# Patient Record
Sex: Female | Born: 1955
Health system: Southern US, Community
[De-identification: ages and names within clinical notes are randomized; demographics above are authoritative.]

## PROBLEM LIST (undated history)

## (undated) DIAGNOSIS — M8580 Other specified disorders of bone density and structure, unspecified site: Secondary | ICD-10-CM

## (undated) DIAGNOSIS — B009 Herpesviral infection, unspecified: Secondary | ICD-10-CM

## (undated) DIAGNOSIS — Z8052 Family history of malignant neoplasm of bladder: Secondary | ICD-10-CM

## (undated) DIAGNOSIS — N951 Menopausal and female climacteric states: Secondary | ICD-10-CM

## (undated) DIAGNOSIS — Z803 Family history of malignant neoplasm of breast: Secondary | ICD-10-CM

## (undated) DIAGNOSIS — E785 Hyperlipidemia, unspecified: Secondary | ICD-10-CM

## (undated) DIAGNOSIS — T7840XA Allergy, unspecified, initial encounter: Secondary | ICD-10-CM

## (undated) DIAGNOSIS — Z8041 Family history of malignant neoplasm of ovary: Secondary | ICD-10-CM

## (undated) HISTORY — DX: Hyperlipidemia, unspecified: E78.5

## (undated) HISTORY — DX: Herpesviral infection, unspecified: B00.9

## (undated) HISTORY — PX: BREAST SURGERY: SHX581

## (undated) HISTORY — PX: REFRACTIVE SURGERY: SHX103

## (undated) HISTORY — PX: KNEE SURGERY: SHX244

## (undated) HISTORY — DX: Family history of malignant neoplasm of bladder: Z80.52

## (undated) HISTORY — DX: Allergy, unspecified, initial encounter: T78.40XA

## (undated) HISTORY — DX: Family history of malignant neoplasm of breast: Z80.3

## (undated) HISTORY — DX: Family history of malignant neoplasm of ovary: Z80.41

## (undated) HISTORY — PX: BREAST BIOPSY: SHX20

## (undated) HISTORY — PX: REDUCTION MAMMAPLASTY: SUR839

---
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
## 2000-04-24 ENCOUNTER — Other Ambulatory Visit: Admission: RE | Admit: 2000-04-24 | Discharge: 2000-04-24 | Payer: Self-pay | Admitting: Obstetrics & Gynecology

## 2000-07-18 ENCOUNTER — Other Ambulatory Visit: Admission: RE | Admit: 2000-07-18 | Discharge: 2000-07-18 | Payer: Self-pay | Admitting: Plastic Surgery

## 2000-07-18 ENCOUNTER — Encounter (INDEPENDENT_AMBULATORY_CARE_PROVIDER_SITE_OTHER): Payer: Self-pay | Admitting: Specialist

## 2001-06-04 ENCOUNTER — Other Ambulatory Visit: Admission: RE | Admit: 2001-06-04 | Discharge: 2001-06-04 | Payer: Self-pay | Admitting: Obstetrics & Gynecology

## 2002-07-06 ENCOUNTER — Other Ambulatory Visit: Admission: RE | Admit: 2002-07-06 | Discharge: 2002-07-06 | Payer: Self-pay | Admitting: Obstetrics & Gynecology

## 2003-07-09 ENCOUNTER — Other Ambulatory Visit: Admission: RE | Admit: 2003-07-09 | Discharge: 2003-07-09 | Payer: Self-pay | Admitting: Obstetrics and Gynecology

## 2004-07-18 ENCOUNTER — Other Ambulatory Visit: Admission: RE | Admit: 2004-07-18 | Discharge: 2004-07-18 | Payer: Self-pay | Admitting: Obstetrics and Gynecology

## 2004-07-21 ENCOUNTER — Encounter: Admission: RE | Admit: 2004-07-21 | Discharge: 2004-07-21 | Payer: Self-pay | Admitting: Obstetrics and Gynecology

## 2005-08-01 ENCOUNTER — Other Ambulatory Visit: Admission: RE | Admit: 2005-08-01 | Discharge: 2005-08-01 | Payer: Self-pay | Admitting: Obstetrics and Gynecology

## 2010-02-23 ENCOUNTER — Ambulatory Visit: Payer: Self-pay | Admitting: Family Medicine

## 2010-05-19 ENCOUNTER — Encounter: Admission: RE | Admit: 2010-05-19 | Discharge: 2010-05-19 | Payer: Self-pay | Admitting: Obstetrics and Gynecology

## 2010-10-31 ENCOUNTER — Ambulatory Visit (HOSPITAL_COMMUNITY): Admission: RE | Admit: 2010-10-31 | Discharge: 2010-10-31 | Payer: Self-pay | Admitting: Obstetrics and Gynecology

## 2010-10-31 HISTORY — PX: DILATION AND CURETTAGE OF UTERUS: SHX78

## 2011-01-30 NOTE — Assessment & Plan Note (Signed)
Summary: Sinus pressure, runny nose, headache - x 1 dy rm 3   Vital Signs:  Patient Profile:   56 Years Old Female CC:      Cold & URI symptoms Height:     60.5 inches Weight:      172 pounds O2 Sat:      100 % O2 treatment:    Room Air Temp:     98.1 degrees F oral Pulse rate:   80 / minute Pulse rhythm:   regular Resp:     16 per minute BP sitting:   136 / 83  (right arm) Cuff size:   regular  Vitals Entered By: Areta Haber CMA (February 23, 2010 5:51 PM)                  Current Allergies: No known allergies History of Present Illness Chief Complaint: Cold & URI symptoms History of Present Illness: ONSET YESTERDAY WITH DRIPPY NOSE, SNEEZING AND EYES WATERING. NO FEVER, NO SORE THROAT. HX OF ALLERGIES. WANT TO MAKE SURE NO SINUS INFECTION. SOME COUGH.   Current Problems: ALLERGIC RHINITIS, SEASONAL (ICD-477.0)   Current Meds ZYRTEC ALLERGY 10 MG TABS (CETIRIZINE HCL) 1 tab by mouth once daily MEDROL (PAK) 4 MG TABS (METHYLPREDNISOLONE) TAKE AS DIRECTED WITH FOOD  REVIEW OF SYSTEMS Constitutional Symptoms      Denies fever, chills, night sweats, weight loss, weight gain, and fatigue.  Eyes       Complains of eye pain and eye drainage.      Denies change in vision, glasses, contact lenses, and eye surgery. Ear/Nose/Throat/Mouth       Complains of frequent runny nose, frequent nose bleeds, sore throat, and hoarseness.      Denies hearing loss/aids, change in hearing, ear pain, ear discharge, dizziness, sinus problems, and tooth pain or bleeding.      Comments: x 1 dy Respiratory       Complains of dry cough.      Denies productive cough, wheezing, shortness of breath, asthma, bronchitis, and emphysema/COPD.  Cardiovascular       Denies murmurs, chest pain, and tires easily with exhertion.    Gastrointestinal       Denies stomach pain, nausea/vomiting, diarrhea, constipation, blood in bowel movements, and indigestion. Genitourniary       Denies painful  urination, kidney stones, and loss of urinary control. Neurological       Complains of headaches.      Denies paralysis, seizures, and fainting/blackouts. Musculoskeletal       Denies muscle pain, joint pain, joint stiffness, decreased range of motion, redness, swelling, muscle weakness, and gout.  Skin       Denies bruising, unusual mles/lumps or sores, and hair/skin or nail changes.  Psych       Denies mood changes, temper/anger issues, anxiety/stress, speech problems, depression, and sleep problems.  Past History:  Past Medical History: Unremarkable  Past Surgical History: Breast Reduction Knee Surgery - arthoscoptic  Lasik  Family History: Family History Hypertension Family History of Stroke M 1st degree relative <50  Social History: Married Never Smoked Alcohol use-no Drug use-no Regular exercise-yes Smoking Status:  never Drug Use:  no Does Patient Exercise:  yes Physical Exam General appearance: well developed, well nourished, no acute distress Eyes: CONJUNCTIVA RED Ears: normal, no lesions or deformities Nasal: THIN CLEAR RHINORRHEA Oral/Pharynx: tongue normal, posterior pharynx without erythema or exudate Neck: neck supple,  trachea midline, no masses Chest/Lungs: no rales, wheezes, or rhonchi bilateral, breath sounds  equal without effort Heart: regular rate and  rhythm, no murmur Assessment New Problems: ALLERGIC RHINITIS, SEASONAL (ICD-477.0)   Plan New Medications/Changes: MEDROL (PAK) 4 MG TABS (METHYLPREDNISOLONE) TAKE AS DIRECTED WITH FOOD  #1 PK x 0, 02/23/2010, Marvis Moeller DO  New Orders: New Patient Level III [99203]   Prescriptions: MEDROL (PAK) 4 MG TABS (METHYLPREDNISOLONE) TAKE AS DIRECTED WITH FOOD  #1 PK x 0   Entered and Authorized by:   Marvis Moeller DO   Signed by:   Marvis Moeller DO on 02/23/2010   Method used:   Print then Give to Patient   RxID:   [id number]   Patient Instructions: 1)  TYLENOL OR MOTRIN AS  NEEDED. CONTINUE ZYRTEC. AVOID MILK PAND CAFFEINE PRODUCTS. FOLLOW UP WITH YOUR PCP OR RETURN IF SYMPTOMS PERSIST OR WORSEN.

## 2011-03-14 LAB — CBC
HCT: 43 % (ref 36.0–46.0)
Hemoglobin: 14.3 g/dL (ref 12.0–15.0)
MCHC: 33.2 g/dL (ref 30.0–36.0)
MCV: 89.9 fL (ref 78.0–100.0)
Platelets: 206 10*3/uL (ref 150–400)
RBC: 4.78 MIL/uL (ref 3.87–5.11)
RDW: 13.9 % (ref 11.5–15.5)
WBC: 9.6 10*3/uL (ref 4.0–10.5)

## 2011-03-14 LAB — URINALYSIS, ROUTINE W REFLEX MICROSCOPIC
Ketones, ur: 15 mg/dL — AB
Nitrite: NEGATIVE
Protein, ur: NEGATIVE mg/dL
Urobilinogen, UA: 0.2 mg/dL (ref 0.0–1.0)
pH: 6 (ref 5.0–8.0)

## 2011-12-12 ENCOUNTER — Other Ambulatory Visit: Payer: Self-pay | Admitting: Dermatology

## 2011-12-29 ENCOUNTER — Emergency Department (INDEPENDENT_AMBULATORY_CARE_PROVIDER_SITE_OTHER)
Admission: EM | Admit: 2011-12-29 | Discharge: 2011-12-29 | Disposition: A | Discharge status: Home / Self Care | Payer: Federal, State, Local not specified - PPO | Source: Home / Self Care | Attending: Family Medicine | Admitting: Family Medicine

## 2011-12-29 DIAGNOSIS — H9209 Otalgia, unspecified ear: Secondary | ICD-10-CM

## 2011-12-29 DIAGNOSIS — H6690 Otitis media, unspecified, unspecified ear: Secondary | ICD-10-CM

## 2011-12-29 DIAGNOSIS — H6693 Otitis media, unspecified, bilateral: Secondary | ICD-10-CM

## 2011-12-29 DIAGNOSIS — J069 Acute upper respiratory infection, unspecified: Secondary | ICD-10-CM

## 2011-12-29 MED ORDER — AMOXICILLIN 875 MG PO TABS: 875.0000 mg | ORAL_TABLET | Freq: Two times a day (BID) | ORAL | Status: AC

## 2011-12-29 MED ORDER — BENZONATATE 200 MG PO CAPS: 200.0000 mg | ORAL_CAPSULE | Freq: Every day | ORAL | Status: AC

## 2011-12-29 NOTE — ED Notes (Signed)
States cough started 12/24/2011, increase coughing at night which prevents her from sleeping; nasal drainage is now greenish. Ear pain started today

## 2011-12-29 NOTE — ED Provider Notes (Signed)
History     CSN: [id number]  Arrival date & time 12/29/11  1448   First MD Initiated Contact with Patient 12/29/11 1650      Chief Complaint  Patient presents with  . Cough  . Otalgia      HPI Comments: Patient complains of approximately 8 day history of gradually progressive URI symptoms beginning with a mild sore throat (now improved), followed by progressive nasal congestion.  A cough started about 4 days ago.  Complains of fatigue and initial myalgias.  Cough is now worse at night and generally non-productive during the day.  There has been no pleuritic pain, shortness of breath, or wheezes.  She complains of tightness in her anterior chest, and ears feel clogged today.  The history is provided by the patient.    History reviewed. No pertinent past medical history.  History reviewed. No pertinent past surgical history.  History reviewed. No pertinent family history.  History  Substance Use Topics  . Smoking status: Never Smoker   . Smokeless tobacco: Not on file  . Alcohol Use: No    OB History    Grav Para Term Preterm Abortions TAB SAB Ect Mult Living                  Review of Systems + sore throat about 8 days ago + cough No pleuritic pain No wheezing + nasal congestion + post-nasal drainage No sinus pain/pressure No itchy/red eyes No earache, but ears feel clogged No hemoptysis No SOB No fever, + chills No nausea No vomiting No abdominal pain No diarrhea No urinary symptoms No skin rashes + fatigue No myalgias No headache Used OTC meds without relief (Nyquil, etc.) Allergies  Review of patient's allergies indicates no known allergies.  Home Medications   Current Outpatient Rx  Name Route Sig Dispense Refill  . AMOXICILLIN 875 MG PO TABS Oral Take 1 tablet (875 mg total) by mouth 2 (two) times daily. 20 tablet 0  . BENZONATATE 200 MG PO CAPS Oral Take 1 capsule (200 mg total) by mouth at bedtime. Take as needed for cough 12 capsule 0     BP 148/80  Pulse 56  Temp 98.5 F (36.9 C)  Resp 24  Ht 5' (1.524 m)  Wt 153 lb 12 oz (69.741 kg)  BMI 30.03 kg/m2  SpO2 99%  Physical Exam Nursing notes and Vital Signs reviewed. Appearance:  Patient appears healthy, stated age, and in no acute distress Eyes:  Pupils are equal, round, and reactive to light and accomodation.  Extraocular movement is intact.  Conjunctivae are not inflamed  Ears:  Canals normal.  Tympanic membranes appear somewhat sclerotic Nose:  Moderately congested turbinates.  No sinus tenderness.   Pharynx:  Normal Neck:  Supple.  Slightly tender shotty posterior nodes are palpated bilaterally  Lungs:  Clear to auscultation.  Breath sounds are equal.  Chest:  Distinct tenderness to palpation over the mid-sternum.  Heart:  Regular rate and rhythm without murmurs, rubs, or gallops.  Abdomen:  Nontender without masses or hepatosplenomegaly.  Bowel sounds are present.  No CVA or flank tenderness.  Extremities:  No edema.  No calf tenderness Skin:  No rash present.   ED Course  Procedures  none  Labs Reviewed -  Tympanogram:  Low peak height (noisy tympanogram) left ear; low peak height right ear    1. Acute upper respiratory infections of unspecified site   2. Otitis media of both ears  MDM  Viral URI with bilateral otitis media.  Begin amoxicillin, Tessalon at bedtime. Take Mucinex D (guaifenesin with decongestant) twice daily for congestion.  Increase fluid intake, rest. May use Afrin nasal spray (or generic oxymetazoline) twice daily for about 5 days.  Also recommend using saline nasal spray several times daily and/or saline nasal irrigation. Stop all antihistamines for now, and other non-prescription cough/cold preparations.        Donna Christen, MD 12/31/11 [phone]

## 2012-04-28 ENCOUNTER — Emergency Department (INDEPENDENT_AMBULATORY_CARE_PROVIDER_SITE_OTHER)
Admission: EM | Admit: 2012-04-28 | Discharge: 2012-04-28 | Disposition: A | Discharge status: Home / Self Care | Payer: Federal, State, Local not specified - PPO | Source: Home / Self Care | Attending: Family Medicine | Admitting: Family Medicine

## 2012-04-28 ENCOUNTER — Encounter: Payer: Self-pay | Admitting: *Deleted

## 2012-04-28 DIAGNOSIS — R109 Unspecified abdominal pain: Secondary | ICD-10-CM

## 2012-04-28 DIAGNOSIS — R110 Nausea: Secondary | ICD-10-CM

## 2012-04-28 DIAGNOSIS — H579 Unspecified disorder of eye and adnexa: Secondary | ICD-10-CM

## 2012-04-28 HISTORY — DX: Menopausal and female climacteric states: N95.1

## 2012-04-28 LAB — POCT URINALYSIS DIP (MANUAL ENTRY)
Bilirubin, UA: NEGATIVE
Glucose, UA: NEGATIVE
Ketones, POC UA: NEGATIVE
Leukocytes, UA: NEGATIVE
Nitrite, UA: NEGATIVE
Protein Ur, POC: NEGATIVE
Spec Grav, UA: 1.025 (ref 1.005–1.03)
Urobilinogen, UA: 0.2 (ref 0–1)
pH, UA: 5 (ref 5–8)

## 2012-04-28 LAB — POCT CBC W AUTO DIFF (K'VILLE URGENT CARE)

## 2012-04-28 MED ORDER — ONDANSETRON HCL 4 MG PO TABS: 4.0000 mg | ORAL_TABLET | Freq: Four times a day (QID) | ORAL | Status: AC

## 2012-04-28 NOTE — ED Provider Notes (Signed)
History     CSN: [id number]  Arrival date & time 04/28/12  1023   First MD Initiated Contact with Patient 04/28/12 1043      Chief Complaint  Patient presents with  . Nausea      HPI Comments: Patient states that she awoke last night with nausea.  It persisted through the night and she later attempted to vomit several times, coughing up a small amount of blood tinged mucous.  She denies diarrhea, and no recent change in bowel movements.  She has had crampy abdominal discomfort.  No GU symptoms.  She denies history of GERD or recurring indigestion.  No fevers, chills, and sweats.  She has been somewhat fatigued.  She is post-menopausal and denies pelvic pain, vaginal discharge or bleeding.  She states that her abdominal pain is somewhat worse with movement.  Patient is a 57 y.o. female presenting with abdominal pain. The history is provided by the patient.  Abdominal Pain The primary symptoms of the illness include abdominal pain, fatigue, nausea, vomiting and hematemesis. The primary symptoms of the illness do not include fever, shortness of breath, diarrhea, hematochezia, dysuria, vaginal discharge or vaginal bleeding. The current episode started 3 to 5 hours ago. The onset of the illness was sudden. The problem has been gradually improving.  The hematemesis is not associated with heartburn.  Associated with: nothing. The patient has not had a change in bowel habit. Additional symptoms associated with the illness include anorexia. Symptoms associated with the illness do not include chills, heartburn, urgency, hematuria, frequency or back pain.    Past Medical History  Diagnosis Date  . Perimenopause     History reviewed. No pertinent past surgical history.  Family History  Problem Relation Age of Onset  . Hypertension Mother   . Stroke Mother   . Hypertension Father   . Anemia Sister   . Diabetes Brother     History  Substance Use Topics  . Smoking status: Never Smoker   .  Smokeless tobacco: Not on file  . Alcohol Use: No    OB History    Grav Para Term Preterm Abortions TAB SAB Ect Mult Living                  Review of Systems  Constitutional: Positive for fatigue. Negative for fever and chills.  Respiratory: Negative for shortness of breath.   Gastrointestinal: Positive for nausea, vomiting, abdominal pain, anorexia and hematemesis. Negative for heartburn, diarrhea and hematochezia.  Genitourinary: Negative for dysuria, urgency, frequency, hematuria, vaginal bleeding and vaginal discharge.  Musculoskeletal: Negative for back pain.  Skin: Negative.     Allergies  Review of patient's allergies indicates no known allergies.  Home Medications   Current Outpatient Rx  Name Route Sig Dispense Refill  . ONDANSETRON HCL 4 MG PO TABS Oral Take 1 tablet (4 mg total) by mouth every 6 (six) hours. as needed for nausea 12 tablet 0    BP 128/77  Pulse 71  Temp(Src) 98.3 F (36.8 C) (Oral)  Resp 14  Ht 5' 0.5" (1.537 m)  Wt 153 lb (69.4 kg)  BMI 29.39 kg/m2  SpO2 98%  Physical Exam Nursing notes and Vital Signs reviewed. Appearance:  Patient appears healthy, stated age, and in no acute distress Eyes:  Pupils are equal, round, and reactive to light and accomodation.  Extraocular movement is intact.  Conjunctivae are not inflamed   Nose:   No sinus tenderness.  Pharynx:  Normal;  moist mucous  membranes  Neck:  Supple.  No adenopathy Lungs:  Clear to auscultation.  Breath sounds are equal.  Heart:  Regular rate and rhythm without murmurs, rubs, or gallops.  Abdomen:   Mild diffuse non-localized tenderness without masses or hepatosplenomegaly.  Bowel sounds are present.  No CVA or flank tenderness.  No rebound tenderness. Extremities:  No edema.  No calf tenderness Skin:  No rash present.   ED Course  Procedures none   Labs Reviewed  POCT CBC W AUTO DIFF ([facility]) CBC:  WBC 11.0; LY 8.0; MO 4.4; GR 87.6; Hgb 13.1; Platelets 205     POCT URINALYSIS DIP (MANUAL ENTRY) negative      1. Nausea   2. Abdominal pain; suspect early viral gastroenteritis.  Abdominal tenderness is minimal with good bowel sounds, and white count minimally elevated.      MDM  Rx given for Zofran. Begin clear liquids today, then advance to a [employer] tomorrow.  Resume a regular diet when tolerated She states that she has an appt with her PCP on 05/01/12.  Recommend that she keep this appt, but followup earlier if symptoms worsen.  She will also need follow-up if she develops persistent hematemesis       Lattie Haw, MD 04/28/12 1309

## 2012-04-28 NOTE — ED Notes (Signed)
Patient c/o nausea, dry heaves and abdominal pain x last night. Pain is cramping in center of abdomen.

## 2012-04-28 NOTE — Discharge Instructions (Signed)
Begin clear liquids today, then advance to a [employer] tomorrow.  Resume a regular diet when tolerated  Clear Liquid Diet The clear liquid dietconsists of foods that are liquid or will become liquid at room temperature.You should be able to see through the liquid and beverages. Examples of foods allowed on a clear liquid diet include fruit juice, broth or bouillon, gelatin, or frozen ice pops. The purpose of this diet is to provide necessary fluid, electrolytes such as sodium and potassium, and energy to keep the body functioning during times when you are not able to consume a regular diet.A clear liquid diet should not be continued for long periods of time as it is not nutritionally adequate.  REASONS FOR USING A CLEAR LIQUID DIET  In sudden onset (acute) conditions for a patient before or after surgery.   As the first step in oral feeding.   For fluid and electrolyte replacement in diarrheal diseases.   As a diet before certain medical tests are performed.  ADEQUACY The clear liquid diet is adequate only in ascorbic acid, according to the Recommended Dietary Allowances of the [employer]. CHOOSING FOODS Breads and Starches  Allowed:  None are allowed.   Avoid: All are avoided.  Vegetables  Allowed:  Strained tomato or vegetable juice.   Avoid: Any others.  Fruit  Allowed:  Strained fruit juices and fruit drinks. Include 1 serving of citrus or vitamin C-enriched fruit juice daily.   Avoid: Any others.  Meat and Meat Substitutes  Allowed:  None are allowed.   Avoid: All are avoided.  Milk  Allowed:  None are allowed.   Avoid: All are avoided.  Soups and Combination Foods  Allowed:  Clear bouillon, broth, or strained broth-based soups.   Avoid: Any others.  Desserts and Sweets  Allowed:  Sugar, honey. High protein gelatin. Flavored gelatin, ices, or frozen ice pops that do not contain milk.   Avoid: Any others.  Fats and Oils  Allowed:  None are  allowed.   Avoid: All are avoided.  Beverages  Allowed: Cereal beverages, coffee (regular or decaffeinated), tea, or soda at the discretion of your caregiver.   Avoid: Any others.  Condiments  Allowed:  Iodized salt.   Avoid: Any others, including pepper.  Supplements  Allowed:  Liquid nutrition beverages.   Avoid: Any others that contain lactose or fiber.  SAMPLE MEAL PLAN Breakfast  4 oz (120 mL) strained orange juice.    to 1 cup (125 to 250 mL) gelatin (plain or fortified).   1 cup (250 mL) beverage (coffee or tea).   Sugar, if desired.  Midmorning Snack   cup (125 mL) gelatin (plain or fortified).  Lunch  1 cup (250 mL) broth or consomm.   4 oz (120 mL) strained grapefruit juice.    cup (125 mL) gelatin (plain or fortified).   1 cup (250 mL) beverage (coffee or tea).   Sugar, if desired.  Midafternoon Snack   cup (125 mL) fruit ice.    cup (125 mL) strained fruit juice.  Dinner  1 cup (250 mL) broth or consomm.    cup (125 mL) cranberry juice.    cup (125 mL) flavored gelatin (plain or fortified).   1 cup (250 mL) beverage (coffee or tea).   Sugar, if desired.  Evening Snack  4 oz (120 mL) strained apple juice (vitamin C-fortified).    cup (125 mL) flavored gelatin (plain or fortified).  Document Released: 12/17/2005 Document Revised: 12/06/2011 Document  Reviewed: 03/16/2011 [facility] Patient Information 2012 [place], [place].   B.R.A.T. Diet Your doctor has recommended the B.R.A.T. diet for you or your child until the condition improves. This is often used to help control diarrhea and vomiting symptoms. If you or your child can tolerate clear liquids, you may have:  Bananas.   Rice.   Applesauce.   Toast (and other simple starches such as crackers, potatoes, noodles).  Be sure to avoid dairy products, meats, and fatty foods until symptoms are better. Fruit juices such as apple, grape, and prune juice can make diarrhea worse.  Avoid these. Continue this diet for 2 days or as instructed by your caregiver. Document Released: 12/17/2005 Document Revised: 12/06/2011 Document Reviewed: 06/05/2007 [facility] Patient Information 2012 [place], [place].

## 2013-01-16 ENCOUNTER — Other Ambulatory Visit: Payer: Self-pay

## 2013-06-23 LAB — HEPATIC FUNCTION PANEL
ALT: 15 U/L (ref 7–35)
AST: 16 U/L (ref 13–35)
Alkaline Phosphatase: 77 U/L (ref 25–125)

## 2013-06-23 LAB — BASIC METABOLIC PANEL
BUN: 12 mg/dL (ref 4–21)
Creatinine: 0.6 mg/dL (ref 0.5–1.1)
Glucose: 93 mg/dL
Potassium: 4.2 mmol/L (ref 3.4–5.3)
SODIUM: 146 mmol/L (ref 137–147)

## 2013-06-23 LAB — LIPID PANEL
Cholesterol: 229 mg/dL — AB (ref 0–200)
HDL: 61 mg/dL (ref 35–70)
LDL Cholesterol: 157 mg/dL
Triglycerides: 57 mg/dL (ref 40–160)

## 2013-06-23 LAB — TSH: TSH: 1.56 u[IU]/mL (ref 0.41–5.90)

## 2013-06-23 LAB — CBC AND DIFFERENTIAL
HEMOGLOBIN: 13.1 g/dL (ref 12.0–16.0)
Platelets: 217 10*3/uL (ref 150–399)
WBC: 5.8 10^3/mL

## 2014-02-03 ENCOUNTER — Ambulatory Visit (INDEPENDENT_AMBULATORY_CARE_PROVIDER_SITE_OTHER): Payer: Federal, State, Local not specified - PPO | Admitting: Obstetrics and Gynecology

## 2014-02-03 ENCOUNTER — Encounter: Payer: Self-pay | Admitting: Obstetrics and Gynecology

## 2014-02-03 VITALS — BP 140/82 | HR 52 | Ht 60.25 in | Wt 184.0 lb

## 2014-02-03 DIAGNOSIS — Z0000 Encounter for general adult medical examination without abnormal findings: Secondary | ICD-10-CM

## 2014-02-03 DIAGNOSIS — Z01419 Encounter for gynecological examination (general) (routine) without abnormal findings: Secondary | ICD-10-CM

## 2014-02-03 LAB — POCT URINALYSIS DIPSTICK
BILIRUBIN UA: NEGATIVE
GLUCOSE UA: NEGATIVE
KETONES UA: NEGATIVE
LEUKOCYTES UA: NEGATIVE
Nitrite, UA: NEGATIVE
PH UA: 5
PROTEIN UA: NEGATIVE
UROBILINOGEN UA: NEGATIVE

## 2014-02-03 LAB — COMPREHENSIVE METABOLIC PANEL
ALBUMIN: 4.6 g/dL (ref 3.5–5.2)
ALT: 22 U/L (ref 0–35)
AST: 19 U/L (ref 0–37)
Alkaline Phosphatase: 67 U/L (ref 39–117)
BUN: 11 mg/dL (ref 6–23)
CALCIUM: 9.5 mg/dL (ref 8.4–10.5)
CO2: 28 meq/L (ref 19–32)
CREATININE: 0.69 mg/dL (ref 0.50–1.10)
Chloride: 106 mEq/L (ref 96–112)
GLUCOSE: 86 mg/dL (ref 70–99)
Potassium: 4 mEq/L (ref 3.5–5.3)
Sodium: 145 mEq/L (ref 135–145)
Total Bilirubin: 0.4 mg/dL (ref 0.2–1.2)
Total Protein: 7.2 g/dL (ref 6.0–8.3)

## 2014-02-03 LAB — CBC
HCT: 44.1 % (ref 36.0–46.0)
HEMOGLOBIN: 14.5 g/dL (ref 12.0–15.0)
MCH: 28.4 pg (ref 26.0–34.0)
MCHC: 32.9 g/dL (ref 30.0–36.0)
MCV: 86.3 fL (ref 78.0–100.0)
PLATELETS: 220 10*3/uL (ref 150–400)
RBC: 5.11 MIL/uL (ref 3.87–5.11)
RDW: 14.1 % (ref 11.5–15.5)
WBC: 7.2 10*3/uL (ref 4.0–10.5)

## 2014-02-03 LAB — LIPID PANEL
CHOL/HDL RATIO: 4.3 ratio
Cholesterol: 234 mg/dL — ABNORMAL HIGH (ref 0–200)
HDL: 55 mg/dL (ref >39)
LDL Cholesterol: 160 mg/dL — ABNORMAL HIGH (ref 0–99)
TRIGLYCERIDES: 94 mg/dL (ref <150)
VLDL: 19 mg/dL (ref 0–40)

## 2014-02-03 NOTE — Progress Notes (Signed)
Patient ID: Rebekah [name], female   DOB: [birth year], 59 y.o.   MRN: [id number]   58 y.o.   Married    Caucasian   female   G3P2011   here for annual exam.  LMP 2011. Some hot flashes if stressed.    Mother diagnosed with TTP and ovarian cancer. Father diagnosed with glaucoma and bladder cancer. Maternal cousin with ovarian cancer. Maternal cousin with breast cancer (not the same person) Maternal grandmother with ovarian cancer or uterine cancer? Sister living in group home.     Patient starting a new diet tomorrow.  Still with urinary leakage. Leaks with exercise. Vesicare gives extra time to get to the bathroom.  Up once a night and not three times a night. She does not think it really helps.  Drinks 2 coffees per day.  Had done physical therapy, which helped.     No LMP recorded. Patient is not currently having periods (Reason: Perimenopausal).          Sexually active: no  The current method of family planning is none.    Exercising: CrossFit and Yoga Last mammogram:  01/20/13, Bi-Rads 1: negative Last pap smear:  01/16/13, WNL, neg HR HPV History of abnormal pap: yes Smoking:  never Alcohol: 3-4 drinks per week Last colonoscopy:  2008 Last Bone Density:  Not sure Last tetanus shot:  UTD Last cholesterol check:  ?  Hgb:                Urine:   Family History  Problem Relation Age of Onset  . Hypertension Mother   . Stroke Mother   . Hypertension Father   . Anemia Sister   . Diabetes Brother     Patient Active Problem List   Diagnosis Date Noted  . ALLERGIC RHINITIS, SEASONAL 02/23/2010    Past Medical History  Diagnosis Date  . Perimenopause     History reviewed. No pertinent past surgical history.  Allergies: Review of patient's allergies indicates no known allergies.  Current Outpatient Prescriptions  Medication Sig Dispense Refill  . Brimonidine Tartrate (MIRVASO) 0.33 % GEL Apply 1 application topically every morning.      . Cholecalciferol  (VITAMIN D3) 2000 UNITS capsule Take 2,000 Units by mouth daily.      . hydrocortisone (ANUSOL-HC) 2.5 % rectal cream Place 1 application rectally 2 (two) times daily.      . metroNIDAZOLE (METROGEL) 1 % gel Apply 1 application topically at bedtime.      Marland Kitchen nystatin-triamcinolone (MYCOLOG II) cream Apply 1 application topically 2 (two) times daily.      . Olopatadine HCl (PATADAY) 0.2 % SOLN Place 1 drop into both eyes 2 (two) times daily.      . solifenacin (VESICARE) 5 MG tablet Take 5 mg by mouth daily.      Marland Kitchen triamcinolone cream (KENALOG) 0.1 % Apply 1 application topically 2 (two) times daily.      . vitamin B-12 (CYANOCOBALAMIN) 1000 MCG tablet Take 1,000 mcg by mouth daily.      Marland Kitchen zolpidem (AMBIEN) 10 MG tablet Take 5 mg by mouth at bedtime as needed for sleep.       No current facility-administered medications for this visit.    ROS: Pertinent items are noted in HPI.  Social Hx:  3 grandchildren  Exam:    BP 140/82  Pulse 52  Ht 5' 0.25" (1.53 m)  Wt 184 lb (83.462 kg)  BMI 35.65 kg/m2   Wt Readings from  Last 3 Encounters:  02/03/14 184 lb (83.462 kg)  04/28/12 153 lb (69.4 kg)  12/29/11 153 lb 12 oz (69.741 kg)     Ht Readings from Last 3 Encounters:  02/03/14 5' 0.25" (1.53 m)  04/28/12 5' 0.5" (1.537 m)  12/29/11 5' (1.524 m)    General appearance: alert, cooperative and appears stated age Head: Normocephalic, without obvious abnormality, atraumatic Neck: no adenopathy, supple, symmetrical, trachea midline and thyroid not enlarged, symmetric, no tenderness/mass/nodules Lungs: clear to auscultation bilaterally Breasts: Scars consistent with bilateral breast reduction, Inspection negative, No nipple retraction or dimpling, No nipple discharge or bleeding, No axillary or supraclavicular adenopathy, Normal to palpation without dominant masses Heart: regular rate and rhythm Abdomen: obese,  soft, non-tender; bowel sounds normal; no masses,  no organomegaly Extremities:  extremities normal, atraumatic, no cyanosis or edema Skin: Skin color, texture, turgor normal. No rashes or lesions Lymph nodes: Cervical, supraclavicular, and axillary nodes normal. No abnormal inguinal nodes palpated Neurologic: Grossly normal   Pelvic: External genitalia:  no lesions              Urethra:  normal appearing urethra with no masses, tenderness or lesions              Bartholins and Skenes: normal                 Vagina: normal appearing vagina with normal color and discharge, no lesions              Cervix: normal appearance              Pap taken: no        Bimanual Exam:  Uterus:  uterus is normal size, shape, consistency and nontender                                      Adnexa: normal adnexa in size, nontender and no masses                                      Rectovaginal: Confirms                                      Anus:  normal sphincter tone, no lesions  A: normal menopausal exam Genuine stress incontinence Family history of breast and ovarian cancer.   P:     Mammogram at [facility].  Order placed and patient will call. pap smear in 2019. OK to stop Vesicare. Do Kegels. Check lipid profile, CBC, CMP.  Encouraged weight loss.  I discussed genetic testing if patient desires. return annually or prn     An After Visit Summary was printed and given to the patient.

## 2014-02-03 NOTE — Patient Instructions (Signed)

## 2014-02-05 ENCOUNTER — Telehealth: Payer: Self-pay | Admitting: Obstetrics and Gynecology

## 2014-02-05 ENCOUNTER — Telehealth: Payer: Self-pay | Admitting: *Deleted

## 2014-02-05 NOTE — Telephone Encounter (Signed)
See next phone message from [facility].  Routing to provider for final review. Patient agreeable to disposition. Will close encounter

## 2014-02-05 NOTE — Telephone Encounter (Signed)
Patient said she was returning carols call i do not see a message

## 2014-02-05 NOTE — Telephone Encounter (Signed)
Thank you.  I will close encounter.  

## 2014-03-29 ENCOUNTER — Other Ambulatory Visit: Payer: Self-pay | Admitting: Obstetrics and Gynecology

## 2014-03-29 ENCOUNTER — Telehealth: Payer: Self-pay | Admitting: Obstetrics and Gynecology

## 2014-03-29 DIAGNOSIS — Z9889 Other specified postprocedural states: Secondary | ICD-10-CM

## 2014-03-29 DIAGNOSIS — Z1231 Encounter for screening mammogram for malignant neoplasm of breast: Secondary | ICD-10-CM

## 2014-03-29 NOTE — Telephone Encounter (Signed)
Returning a call to Kaitlyn. °

## 2014-03-29 NOTE — Telephone Encounter (Signed)
I mentioned [facility]. They are located off of [address].

## 2014-03-29 NOTE — Telephone Encounter (Signed)
The [facility] doctor is Dr. Beatrice Lecher.

## 2014-03-29 NOTE — Telephone Encounter (Signed)
Patient says at her last visit Dr.Silva recommeded a PCP to her. Patient cannot remember this PCP's name.

## 2014-03-29 NOTE — Telephone Encounter (Signed)
Dr.Silva, I do not see referral information for this patient in last office visit or referral tab in [facility]. Who would you like me to recommend for this patient?

## 2014-03-29 NOTE — Telephone Encounter (Signed)
Spoke with patient. Advised the doctor recommended for the [place] is Dr.Catherine Metheney. Patient verbalizes understanding and is agreeable.  Routing to provider for final review. Patient agreeable to disposition. Will close encounter

## 2014-03-29 NOTE — Telephone Encounter (Signed)
Spoke with patient. [place] recommended. Address and phone number given. Patient states that she remembers Dr.Silva mentioning either [place] or [facility] [place] to see a female provider. Patient requesting possible information about these if any from Dr.Silva due to the fact that they are closer to home. States that if neither are correct she will be willing to try [facility]. Advised would check with provider and call back.

## 2014-03-29 NOTE — Telephone Encounter (Signed)
Left message to call Kaitlyn at [phone]. 

## 2014-04-09 ENCOUNTER — Ambulatory Visit
Admission: RE | Admit: 2014-04-09 | Discharge: 2014-04-09 | Disposition: A | Discharge status: Home / Self Care | Payer: Federal, State, Local not specified - PPO | Source: Ambulatory Visit | Attending: Obstetrics and Gynecology | Admitting: Obstetrics and Gynecology

## 2014-04-09 DIAGNOSIS — Z9889 Other specified postprocedural states: Secondary | ICD-10-CM

## 2014-04-09 DIAGNOSIS — Z1231 Encounter for screening mammogram for malignant neoplasm of breast: Secondary | ICD-10-CM

## 2014-04-14 ENCOUNTER — Ambulatory Visit (INDEPENDENT_AMBULATORY_CARE_PROVIDER_SITE_OTHER): Payer: Federal, State, Local not specified - PPO | Admitting: Physician Assistant

## 2014-04-14 ENCOUNTER — Encounter: Payer: Self-pay | Admitting: Physician Assistant

## 2014-04-14 VITALS — BP 112/62 | HR 54 | Ht 60.0 in | Wt 177.0 lb

## 2014-04-14 DIAGNOSIS — J309 Allergic rhinitis, unspecified: Secondary | ICD-10-CM

## 2014-04-14 DIAGNOSIS — N318 Other neuromuscular dysfunction of bladder: Secondary | ICD-10-CM

## 2014-04-14 DIAGNOSIS — E7800 Pure hypercholesterolemia, unspecified: Secondary | ICD-10-CM

## 2014-04-14 DIAGNOSIS — R002 Palpitations: Secondary | ICD-10-CM

## 2014-04-14 DIAGNOSIS — N3281 Overactive bladder: Secondary | ICD-10-CM | POA: Insufficient documentation

## 2014-04-14 DIAGNOSIS — J302 Other seasonal allergic rhinitis: Secondary | ICD-10-CM | POA: Insufficient documentation

## 2014-04-14 MED ORDER — MIRABEGRON ER 25 MG PO TB24: 25.0000 mg | ORAL_TABLET | Freq: Every day | ORAL | Status: DC

## 2014-04-14 MED ORDER — LEVOCETIRIZINE DIHYDROCHLORIDE 5 MG PO TABS: 5.0000 mg | ORAL_TABLET | Freq: Every evening | ORAL | Status: DC

## 2014-04-14 MED ORDER — BECLOMETHASONE DIPROPIONATE 80 MCG/ACT NA AERS: INHALATION_SPRAY | NASAL | Status: DC

## 2014-04-14 NOTE — Patient Instructions (Addendum)
Allergies start xyzal. Stop OTC anti-histamines. Try Qnasl 2 sprays each nostril daily. Stop flonase.  Myrbetriq start once daily.

## 2014-04-16 NOTE — Progress Notes (Addendum)
Subjective:    Patient ID: Rebekah [name], female    DOB: 1955-08-16, 59 y.o.   MRN: [id number]  HPI Pt is a 59 yo female who presents to the clinic to establish care.   . Active Ambulatory Problems    Diagnosis Date Noted  . ALLERGIC RHINITIS, SEASONAL 02/23/2010  . Elevated LDL cholesterol level 04/14/2014  . Allergic rhinitis 04/14/2014  . Seasonal allergies 04/14/2014  . OAB (overactive bladder) 04/14/2014   Resolved Ambulatory Problems    Diagnosis Date Noted  . No Resolved Ambulatory Problems   Past Medical History  Diagnosis Date  . Perimenopause   . Hyperlipidemia    . History   Social History  . Marital Status: Married    Spouse Name: N/A    Number of Children: 2  . Years of Education: N/A   Occupational History  . Not on file.   Social History Main Topics  . Smoking status: Never Smoker   . Smokeless tobacco: Never Used  . Alcohol Use: 1.5 - 2.0 oz/week    3-4 drink(s) per week  . Drug Use: No  . Sexual Activity: No   Other Topics Concern  . Not on file   Social History Narrative  . No narrative on file   . Family History  Problem Relation Age of Onset  . Hypertension Mother   . Stroke Mother   . Cancer Mother   . Hyperlipidemia Mother   . Hypertension Father   . Cancer Father   . Hyperlipidemia Father   . Anemia Sister   . Diabetes Brother    Pt has a hx of really bad allergies. Per pt seem to be getting worse every year. She switches up anti histamine to get better coverage. She is currently on clartin and flonase.and pataday. She feels congested and has a continual dry cough. Denies any fever, chills, nausea, vomiting, ST, ear pain.   She also has problems with stress and urge incontience. She has tried numerous exercises and natural supplements which have not worked. She also started Vesicare but caused her mouth to be extremely dry in the mouth. She had to stop. She is now on nothing she leaks every day multiple times a day.   For  last 1-2 months felt heart fluttering. Hx of irregular heart beat. No known trigger. No relation to exertion. No CP, SOB, nausea or vomiting. Resolves quickly.    Review of Systems  All other systems reviewed and are negative.      Objective:   Physical Exam  Constitutional: She is oriented to person, place, and time. She appears well-developed and well-nourished.  HENT:  Head: Normocephalic and atraumatic.  Right Ear: External ear normal.  Left Ear: External ear normal.  Mouth/Throat: Oropharynx is clear and moist.  TM's clear bilaterally.  Oropharynx erythematous with PND.  Bilateral red and swollen nasal turbinates.  Negative for maxillary tenderness to palpitation.   Eyes:  Injected eyes bilaterally.   Neck: Normal range of motion. Neck supple. No thyromegaly present.  Cardiovascular: Normal rate, regular rhythm and normal heart sounds.   Pulmonary/Chest: Effort normal and breath sounds normal. She has no wheezes.  Lymphadenopathy:    She has no cervical adenopathy.  Neurological: She is alert and oriented to person, place, and time.  Skin: Skin is dry.  Psychiatric: She has a normal mood and affect. Her behavior is normal.          Assessment & Plan:  Allergic rhinitis/seasonal allergies- Allergies  start xyzal. Stop OTC anti-histamines. Try Qnasl 2 sprays each nostril daily. Stop flonase. Continue pataday for eye allergies. Could consider adding singulair if needed.   Cough- likely due to PND. Peak flows in the green. Goal is to treat allergies and treat cough in process.   Hx of irregular heart rate/heart fluttering- EKG sinus bradycardia, No ST elevation or depression, negative for PVC's or arrhthymias. likey PVC's could be food, drink, stress. Look for trigger and try to avoid. Reassured PVC's are normal variant.   OAB- start myrbetriq daily 25mg  daily. Discussed SE. Follow up in one month.   Elevated LDL- will continue to monitor. Continue to have low fat diet  and consider fish oil.

## 2014-04-21 ENCOUNTER — Encounter: Payer: Self-pay | Admitting: *Deleted

## 2014-04-28 ENCOUNTER — Encounter: Payer: Self-pay | Admitting: *Deleted

## 2014-05-10 ENCOUNTER — Other Ambulatory Visit: Payer: Self-pay | Admitting: Physician Assistant

## 2014-05-10 ENCOUNTER — Encounter: Payer: Self-pay | Admitting: Physician Assistant

## 2014-05-10 MED ORDER — MONTELUKAST SODIUM 10 MG PO TABS: 10.0000 mg | ORAL_TABLET | Freq: Every day | ORAL | Status: DC

## 2014-05-26 ENCOUNTER — Ambulatory Visit: Payer: Federal, State, Local not specified - PPO | Admitting: Physician Assistant

## 2014-05-28 ENCOUNTER — Ambulatory Visit (INDEPENDENT_AMBULATORY_CARE_PROVIDER_SITE_OTHER): Payer: Federal, State, Local not specified - PPO | Admitting: Physician Assistant

## 2014-05-28 ENCOUNTER — Encounter: Payer: Self-pay | Admitting: Physician Assistant

## 2014-05-28 VITALS — BP 139/74 | HR 55 | Ht 60.0 in | Wt 179.0 lb

## 2014-05-28 DIAGNOSIS — J309 Allergic rhinitis, unspecified: Secondary | ICD-10-CM

## 2014-05-28 DIAGNOSIS — N3281 Overactive bladder: Secondary | ICD-10-CM

## 2014-05-28 DIAGNOSIS — N318 Other neuromuscular dysfunction of bladder: Secondary | ICD-10-CM

## 2014-05-28 DIAGNOSIS — J302 Other seasonal allergic rhinitis: Secondary | ICD-10-CM

## 2014-05-28 MED ORDER — MIRABEGRON ER 25 MG PO TB24: 25.0000 mg | ORAL_TABLET | Freq: Every day | ORAL | Status: DC

## 2014-05-28 MED ORDER — BECLOMETHASONE DIPROPIONATE 80 MCG/ACT NA AERS: INHALATION_SPRAY | NASAL | Status: DC

## 2014-05-28 NOTE — Progress Notes (Signed)
   Subjective:    Patient ID: Rebekah [name], female    DOB: August 07, 1955, 59 y.o.   MRN: [id number]  HPI Pt presents to clinic to follow up on allergies. Pt reports she is significantly better since addition of singulair  To her xyzal, pataday, qnasl regimen. She still feels like these are the worst her allergies have been. She would like to see if she is a candidate for allergy shots.   OAB- myrbetriq helps signifcantly with symptoms of OAB very happy with symptoms control but still pricey for pt.    Review of Systems  All other systems reviewed and are negative.      Objective:   Physical Exam  Constitutional: She is oriented to person, place, and time. She appears well-developed and well-nourished.  HENT:  Head: Normocephalic and atraumatic.  Right Ear: External ear normal.  Left Ear: External ear normal.  Nose: Nose normal.  Mouth/Throat: Oropharynx is clear and moist.  TM's clear.   Negative for any maxillary or frontal sinus tenderness.   Eyes:  Bilateral conjunctivia with some injection.   Neck: Normal range of motion. Neck supple.  Cardiovascular: Normal rate, regular rhythm and normal heart sounds.   Pulmonary/Chest: Effort normal and breath sounds normal. She has no wheezes.  Lymphadenopathy:    She has no cervical adenopathy.  Neurological: She is alert and oriented to person, place, and time.  Skin: Skin is dry.  Psychiatric: She has a normal mood and affect. Her behavior is normal.          Assessment & Plan:  Chronic allergies/ Seasonal allergies/allergic rhinitis/allergic conjunctivitis- continue on xyzal, singulair, qnasl, pataday. Will refer to allergist for further testing. Seems pt is still not 100 percent controlled and would benefit from appt.   OAB- refilled myrbetriq. Will contact drug rep to see why coupon card is not working for her.

## 2014-05-30 DIAGNOSIS — J309 Allergic rhinitis, unspecified: Secondary | ICD-10-CM | POA: Insufficient documentation

## 2014-06-04 ENCOUNTER — Other Ambulatory Visit: Payer: Self-pay | Admitting: *Deleted

## 2014-06-04 MED ORDER — MIRABEGRON ER 25 MG PO TB24: 25.0000 mg | ORAL_TABLET | Freq: Every day | ORAL | Status: DC

## 2014-06-06 ENCOUNTER — Encounter: Payer: Self-pay | Admitting: Physician Assistant

## 2014-07-08 ENCOUNTER — Emergency Department (INDEPENDENT_AMBULATORY_CARE_PROVIDER_SITE_OTHER): Payer: Federal, State, Local not specified - PPO

## 2014-07-08 ENCOUNTER — Telehealth: Payer: Self-pay

## 2014-07-08 ENCOUNTER — Emergency Department (INDEPENDENT_AMBULATORY_CARE_PROVIDER_SITE_OTHER)
Admission: EM | Admit: 2014-07-08 | Discharge: 2014-07-08 | Disposition: A | Discharge status: Home / Self Care | Payer: Federal, State, Local not specified - PPO | Source: Home / Self Care | Attending: Emergency Medicine | Admitting: Emergency Medicine

## 2014-07-08 ENCOUNTER — Encounter: Payer: Self-pay | Admitting: Emergency Medicine

## 2014-07-08 DIAGNOSIS — M79609 Pain in unspecified limb: Secondary | ICD-10-CM

## 2014-07-08 DIAGNOSIS — S9031XA Contusion of right foot, initial encounter: Secondary | ICD-10-CM

## 2014-07-08 DIAGNOSIS — S9030XA Contusion of unspecified foot, initial encounter: Secondary | ICD-10-CM

## 2014-07-08 MED ORDER — IBUPROFEN 200 MG PO TABS: ORAL_TABLET | ORAL | Status: DC

## 2014-07-08 NOTE — Telephone Encounter (Signed)
Rebekah [name] called and stated that she fell yesterday on her steps and today she has a knot the size of a goose egg on her foot and it is starting to hurt and bruise and turn really red. I told her to come over to urgent care and let them check it out just to see iof there could be a little crack or hairline fracture. Pt agreed that's what she thinks she should do./Montrice Montuori,CMA

## 2014-07-08 NOTE — ED Provider Notes (Signed)
CSN: [id number]     Arrival date & time 07/08/14  1742 History   First MD Initiated Contact with Patient 07/08/14 1754     Chief Complaint  Patient presents with  . Foot Pain   (Consider location/radiation/quality/duration/timing/severity/associated sxs/prior Treatment) HPI Reports falling down a few steps and twisting right foot last evening; has not had great deal of pain and is able to ambulate, but lateral area now edematous and bruised. The pain is sharp, 3/10 in intensity. Worse when trying to weight-bear, but she can ambulate Past Medical History  Diagnosis Date  . Perimenopause   . Hyperlipidemia    History reviewed. No pertinent past surgical history. Family History  Problem Relation Age of Onset  . Hypertension Mother   . Stroke Mother   . Cancer Mother   . Hyperlipidemia Mother   . Hypertension Father   . Cancer Father   . Hyperlipidemia Father   . Anemia Sister   . Diabetes Brother    History  Substance Use Topics  . Smoking status: Never Smoker   . Smokeless tobacco: Never Used  . Alcohol Use: 1.5 - 2.0 oz/week    3-4 drink(s) per week   OB History   Grav Para Term Preterm Abortions TAB SAB Ect Mult Living   3 2 2  1     1      Review of Systems  All other systems reviewed and are negative.   Allergies  Vesicare  Home Medications   Prior to Admission medications   Medication Sig Start Date End Date Taking? Authorizing Provider  Beclomethasone Dipropionate 80 MCG/ACT AERS 2 sprays each nostril daily. 05/28/14   Jade L Breeback, PA-C  Brimonidine Tartrate (MIRVASO) 0.33 % GEL Apply 1 application topically every morning.    Historical Provider, MD  Cholecalciferol (VITAMIN D3) 2000 UNITS capsule Take 2,000 Units by mouth daily.    Historical Provider, MD  hydrocortisone (ANUSOL-HC) 2.5 % rectal cream Place 1 application rectally 2 (two) times daily.    Historical Provider, MD  ibuprofen (ADVIL,MOTRIN) 200 MG tablet Take three tablets ( 600 milligrams  total) every 6 with food as needed for pain. 07/08/14   Jacqulyn Cane, MD  levocetirizine (XYZAL) 5 MG tablet Take 1 tablet (5 mg total) by mouth every evening. 04/14/14   Jade L Breeback, PA-C  mirabegron ER (MYRBETRIQ) 25 MG TB24 tablet Take 1 tablet (25 mg total) by mouth daily. 06/04/14   Jade L Breeback, PA-C  montelukast (SINGULAIR) 10 MG tablet Take 1 tablet (10 mg total) by mouth at bedtime. 05/10/14   Jade L Breeback, PA-C  nystatin-triamcinolone (MYCOLOG II) cream Apply 1 application topically 2 (two) times daily.    Historical Provider, MD  Olopatadine HCl (PATADAY) 0.2 % SOLN Place 1 drop into both eyes 2 (two) times daily.    Historical Provider, MD  triamcinolone cream (KENALOG) 0.1 % Apply 1 application topically 2 (two) times daily.    Historical Provider, MD  vitamin B-12 (CYANOCOBALAMIN) 1000 MCG tablet Take 1,000 mcg by mouth daily.    Historical Provider, MD  zolpidem (AMBIEN) 10 MG tablet Take 5 mg by mouth at bedtime as needed for sleep.    Historical Provider, MD   BP 131/76  Pulse 56  Temp(Src) 98.2 F (36.8 C) (Oral)  Resp 16  Ht 5' (1.524 m)  Wt 180 lb (81.647 kg)  BMI 35.15 kg/m2  SpO2 95% Physical Exam  Nursing note and vitals reviewed. Constitutional: She is oriented to person, place,  and time. She appears well-developed and well-nourished. No distress.  HENT:  Head: Normocephalic and atraumatic.  Eyes: Conjunctivae and EOM are normal. Pupils are equal, round, and reactive to light. No scleral icterus.  Neck: Normal range of motion.  Cardiovascular: Normal rate.   Pulmonary/Chest: Effort normal.  Abdominal: She exhibits no distension.  Musculoskeletal: Normal range of motion.  Right lateral foot is swollen ecchymotic and tender. Neurovascular intact  Neurological: She is alert and oriented to person, place, and time.  Skin: Skin is warm.  Psychiatric: She has a normal mood and affect.   Right ankle exam normal. ED Course  Procedures (including critical care  time) Labs Review Labs Reviewed - No data to display  Imaging Review Dg Foot Complete Right  07/08/2014   CLINICAL DATA:  Lateral foot pain after falling yesterday.  EXAM: RIGHT FOOT COMPLETE - 3+ VIEW  COMPARISON:  None.  FINDINGS: The bones appear mildly demineralized. There is no evidence of acute fracture or dislocation. The joint spaces are maintained. No focal soft tissue swelling identified.  IMPRESSION: No acute osseous findings.   Electronically Signed   By: Camie Patience M.D.   On: 07/08/2014 18:21     MDM   1. Contusion of right foot, initial encounter    X-ray right foot negative. No fracture or dislocation .  Treatment options discussed, as well as risks, benefits, alternatives. Patient voiced understanding and agreement with the following plans: Ace bandage applied. Encourage rest, ice, compression with ACE bandage, and elevation of injured body part. Other advice given. She declined crutches or a walking shoe/boot She declined prescription pain medication, and she prefers to use ibuprofen when necessary. Followup with PCP or orthopedist if no better 7 days, sooner if worse or new symptoms. She voiced understanding and agreement.    Jacqulyn Cane, MD 07/08/14 2206

## 2014-07-08 NOTE — ED Notes (Signed)
Reports falling down a few steps and twisting right foot last evening; has not had great deal of pain and is able to ambulate, but lateral area now edematous and bruised.

## 2014-09-13 ENCOUNTER — Encounter: Payer: Self-pay | Admitting: Physician Assistant

## 2014-09-13 ENCOUNTER — Ambulatory Visit (INDEPENDENT_AMBULATORY_CARE_PROVIDER_SITE_OTHER): Payer: Federal, State, Local not specified - PPO | Admitting: Physician Assistant

## 2014-09-13 VITALS — BP 151/76 | HR 62 | Ht 60.0 in | Wt 184.0 lb

## 2014-09-13 DIAGNOSIS — Z20818 Contact with and (suspected) exposure to other bacterial communicable diseases: Secondary | ICD-10-CM

## 2014-09-13 DIAGNOSIS — L989 Disorder of the skin and subcutaneous tissue, unspecified: Secondary | ICD-10-CM | POA: Diagnosis not present

## 2014-09-13 DIAGNOSIS — Z2089 Contact with and (suspected) exposure to other communicable diseases: Secondary | ICD-10-CM

## 2014-09-13 MED ORDER — DOXYCYCLINE HYCLATE 100 MG PO TABS: 100.0000 mg | ORAL_TABLET | Freq: Two times a day (BID) | ORAL | Status: DC

## 2014-09-13 NOTE — Progress Notes (Signed)
   Subjective:    Patient ID: Rebekah [name], female    DOB: Dec 26, 1955, 59 y.o.   MRN: [id number]  HPI Pt presents to the clinic with small bump to the right of her nose. Noticed for last 2 days. Pt's sister just got out of hospital for MrSA infection that started with a small bump. No fever, chills, itching, nausea or vomiting. Not tried anything to make better. Seems to be getting a little bigger. No drainage and not opened.    Review of Systems  All other systems reviewed and are negative.      Objective:   Physical Exam  Constitutional: She appears well-developed and well-nourished.  HENT:  Head:            Assessment & Plan:  MRSA exposure/skin bump- treated with doxycycline for 10 days. Cold compresses encouraged. Tylenol/ibuprofen for tenderness. bactroban samples given for nasal colonization treatment.

## 2014-09-13 NOTE — Patient Instructions (Signed)
Doxy for 10 days.

## 2014-09-20 ENCOUNTER — Inpatient Hospital Stay (HOSPITAL_COMMUNITY): Payer: Federal, State, Local not specified - PPO

## 2014-09-20 ENCOUNTER — Inpatient Hospital Stay (HOSPITAL_COMMUNITY)
Admission: AD | Admit: 2014-09-20 | Discharge: 2014-09-20 | Disposition: A | Discharge status: Home / Self Care | Payer: Federal, State, Local not specified - PPO | Source: Ambulatory Visit | Attending: Obstetrics and Gynecology | Admitting: Obstetrics and Gynecology

## 2014-09-20 ENCOUNTER — Encounter (HOSPITAL_COMMUNITY): Payer: Self-pay | Admitting: General Practice

## 2014-09-20 ENCOUNTER — Ambulatory Visit: Payer: Federal, State, Local not specified - PPO | Admitting: Nurse Practitioner

## 2014-09-20 ENCOUNTER — Telehealth: Payer: Self-pay | Admitting: Obstetrics and Gynecology

## 2014-09-20 DIAGNOSIS — R109 Unspecified abdominal pain: Secondary | ICD-10-CM

## 2014-09-20 DIAGNOSIS — R1031 Right lower quadrant pain: Secondary | ICD-10-CM | POA: Insufficient documentation

## 2014-09-20 DIAGNOSIS — R319 Hematuria, unspecified: Secondary | ICD-10-CM | POA: Insufficient documentation

## 2014-09-20 DIAGNOSIS — N949 Unspecified condition associated with female genital organs and menstrual cycle: Secondary | ICD-10-CM | POA: Diagnosis not present

## 2014-09-20 LAB — URINALYSIS, ROUTINE W REFLEX MICROSCOPIC
Bilirubin Urine: NEGATIVE
Glucose, UA: NEGATIVE mg/dL
Ketones, ur: NEGATIVE mg/dL
Leukocytes, UA: NEGATIVE
NITRITE: NEGATIVE
PH: 6 (ref 5.0–8.0)
Protein, ur: NEGATIVE mg/dL
Urobilinogen, UA: 0.2 mg/dL (ref 0.0–1.0)

## 2014-09-20 LAB — CBC
HCT: 40.2 % (ref 36.0–46.0)
HEMOGLOBIN: 12.8 g/dL (ref 12.0–15.0)
MCH: 29.3 pg (ref 26.0–34.0)
MCHC: 31.8 g/dL (ref 30.0–36.0)
MCV: 92 fL (ref 78.0–100.0)
Platelets: 225 10*3/uL (ref 150–400)
RBC: 4.37 MIL/uL (ref 3.87–5.11)
RDW: 13.6 % (ref 11.5–15.5)
WBC: 9.6 10*3/uL (ref 4.0–10.5)

## 2014-09-20 LAB — URINE MICROSCOPIC-ADD ON

## 2014-09-20 MED ORDER — IOHEXOL 300 MG/ML  SOLN
50.0000 mL | INTRAMUSCULAR | Status: AC
  Administered 2014-09-20: 50 mL via ORAL

## 2014-09-20 MED ORDER — KETOROLAC TROMETHAMINE 60 MG/2ML IM SOLN
60.0000 mg | Freq: Once | INTRAMUSCULAR | Status: AC
  Administered 2014-09-20: 60 mg via INTRAMUSCULAR
  Filled 2014-09-20: qty 2

## 2014-09-20 MED ORDER — IOHEXOL 300 MG/ML  SOLN
100.0000 mL | Freq: Once | INTRAMUSCULAR | Status: AC | PRN
  Administered 2014-09-20: 100 mL via INTRAVENOUS

## 2014-09-20 MED ORDER — HYDROCODONE-ACETAMINOPHEN 5-325 MG PO TABS: 1.0000 | ORAL_TABLET | ORAL | Status: DC | PRN

## 2014-09-20 MED ORDER — HYDROCODONE-ACETAMINOPHEN 5-325 MG PO TABS
1.0000 | ORAL_TABLET | ORAL | Status: DC | PRN
Start: 2014-09-20 — End: 2016-01-11

## 2014-09-20 NOTE — Telephone Encounter (Signed)
Please have patient go to [facility] for evaluation.  She may need some imaging performed, and we do not have ultrasound here today in office.  Thanks.

## 2014-09-20 NOTE — MAU Note (Signed)
Patient states she started having right lower abdominal pain on 9-20. Denies nausea but has diarrhea off and on.

## 2014-09-20 NOTE — Telephone Encounter (Signed)
Spoke with patient. Patient states that Saturday she started to have a "throbbing pain" on her right side. States that it started off as being intermittent but has progressed to being constant with intensity fluctuations. "When I was laying down last night it hurt so bad it brought me to tears." Patient states that she is in 6/10 pain today. Has not taken anything for pain. Denies change in bowel habits or nausea. Patient was in a workout competition on Saturday but states that she did not pull or hurt anything. Advised patient will speak with Dr.Silva and give patient a call back with appointment availability to get her scheduled for evaluation. Patient is agreeable and can come in today if possible.

## 2014-09-20 NOTE — Telephone Encounter (Signed)
Patient is having right side pain since Saturday afternoon. Patient feels this pain is coming from her right ovary.

## 2014-09-20 NOTE — Telephone Encounter (Signed)
Spoke with patient. Advised patient of message as seen below from [place]. Patient agreeable. Patient states that she will head to [facility] in 15 minutes.  Milford Cage, FNP  Cc: Dr.Silva  Routing to provider for final review. Patient agreeable to disposition. Will close encounter

## 2014-09-20 NOTE — MAU Provider Note (Signed)
History     CSN: [id number]  Arrival date and time: 09/20/14 1112   First Provider Initiated Contact with Patient 09/20/14 1157      Chief Complaint  Patient presents with  . Abdominal Pain   HPI  [name] is a 59 y.o. female [id number] who presents with right lower quadrant pain that started Saturday evening. This is a new complaint; the pain did not go away. When she lays down at night it becomes worse, she was unable to find a position in bed last night where the pain was tolerable. The pain comes and goes and at times radiates to her lower back.  Currently the pain is 4/10; however the pain fluctuates. Of note, the patient does cross fit for exercise; last work out was Saturday and it involved squats and other strenuous activity. The patient does not feel the pain is related to this.   OB History   Grav Para Term Preterm Abortions TAB SAB Ect Mult Living   3 2 2  1     1       Past Medical History  Diagnosis Date  . Perimenopause   . Hyperlipidemia     Past Surgical History  Procedure Laterality Date  . Breast surgery    . Knee surgery Bilateral   . Refractive surgery Bilateral     Family History  Problem Relation Age of Onset  . Hypertension Mother   . Stroke Mother   . Cancer Mother   . Hyperlipidemia Mother   . Hypertension Father   . Cancer Father   . Hyperlipidemia Father   . Anemia Sister   . Diabetes Brother     History  Substance Use Topics  . Smoking status: Never Smoker   . Smokeless tobacco: Never Used  . Alcohol Use: 1.5 - 2.0 oz/week    3-4 drink(s) per week    Allergies:  Allergies  Allergen Reactions  . Vesicare [Solifenacin]     Dry mouth    Prescriptions prior to admission  Medication Sig Dispense Refill  . Beclomethasone Dipropionate 80 MCG/ACT AERS 2 sprays each nostril daily.  3 Inhaler  11  . Brimonidine Tartrate (MIRVASO) 0.33 % GEL Apply 1 application topically every morning.      . Cholecalciferol (VITAMIN D3) 2000  UNITS capsule Take 2,000 Units by mouth daily.      Marland Kitchen doxycycline (VIBRA-TABS) 100 MG tablet Take 1 tablet (100 mg total) by mouth 2 (two) times daily. For 10 days.  20 tablet  0  . hydrocortisone (ANUSOL-HC) 2.5 % rectal cream Place 1 application rectally 2 (two) times daily.      Marland Kitchen ibuprofen (ADVIL,MOTRIN) 200 MG tablet Take three tablets ( 600 milligrams total) every 6 with food as needed for pain.  30 tablet  0  . levocetirizine (XYZAL) 5 MG tablet Take 1 tablet (5 mg total) by mouth every evening.  30 tablet  4  . mirabegron ER (MYRBETRIQ) 25 MG TB24 tablet Take 1 tablet (25 mg total) by mouth daily.  90 tablet  1  . montelukast (SINGULAIR) 10 MG tablet Take 1 tablet (10 mg total) by mouth at bedtime.  30 tablet  5  . nystatin-triamcinolone (MYCOLOG II) cream Apply 1 application topically 2 (two) times daily.      . Olopatadine HCl (PATADAY) 0.2 % SOLN Place 1 drop into both eyes 2 (two) times daily.      Marland Kitchen triamcinolone cream (KENALOG) 0.1 % Apply 1 application topically  2 (two) times daily.      . vitamin B-12 (CYANOCOBALAMIN) 1000 MCG tablet Take 1,000 mcg by mouth daily.      Marland Kitchen zolpidem (AMBIEN) 10 MG tablet Take 5 mg by mouth at bedtime as needed for sleep.       Results for orders placed during the hospital encounter of 09/20/14 (from the past 48 hour(s))  URINALYSIS, ROUTINE W REFLEX MICROSCOPIC     Status: Abnormal   Collection Time    09/20/14 11:25 AM      Result Value Ref Range   Color, Urine YELLOW  YELLOW   APPearance CLEAR  CLEAR   Specific Gravity, Urine <1.005 (*) 1.005 - 1.030   pH 6.0  5.0 - 8.0   Glucose, UA NEGATIVE  NEGATIVE mg/dL   Hgb urine dipstick SMALL (*) NEGATIVE   Bilirubin Urine NEGATIVE  NEGATIVE   Ketones, ur NEGATIVE  NEGATIVE mg/dL   Protein, ur NEGATIVE  NEGATIVE mg/dL   Urobilinogen, UA 0.2  0.0 - 1.0 mg/dL   Nitrite NEGATIVE  NEGATIVE   Leukocytes, UA NEGATIVE  NEGATIVE  URINE MICROSCOPIC-ADD ON     Status: None   Collection Time    09/20/14  11:25 AM      Result Value Ref Range   Squamous Epithelial / LPF RARE  RARE   WBC, UA 0-2  <3 WBC/hpf   RBC / HPF 0-2  <3 RBC/hpf  CBC     Status: None   Collection Time    09/20/14 12:15 PM      Result Value Ref Range   WBC 9.6  4.0 - 10.5 K/uL   RBC 4.37  3.87 - 5.11 MIL/uL   Hemoglobin 12.8  12.0 - 15.0 g/dL   HCT 40.2  36.0 - 46.0 %   MCV 92.0  78.0 - 100.0 fL   MCH 29.3  26.0 - 34.0 pg   MCHC 31.8  30.0 - 36.0 g/dL   RDW 13.6  11.5 - 15.5 %   Platelets 225  150 - 400 K/uL   Ct Abdomen Pelvis W Wo Contrast  09/20/2014   CLINICAL DATA:  Right lower quadrant pain.  Trace hematuria.  EXAM: CT ABDOMEN AND PELVIS WITHOUT AND WITH CONTRAST  TECHNIQUE: Multidetector CT imaging of the abdomen and pelvis was performed following the standard protocol before and following the bolus administration of intravenous contrast.  CONTRAST:  12mL OMNIPAQUE IOHEXOL 300 MG/ML  SOLN  COMPARISON:  None.  FINDINGS: Lower chest: No evidnce for pulmonary nodule or mass. No focal consolidation. No evidence for pleural effusion.  Hepatobiliary: No focal abnormality within the liver parenchyma. No evidence for gallstones. No intra or extrahepatic biliary duct dilatation.  Spleen: No splenomegaly. No focal mass lesion.  Pancreas: No focal mass lesion. No dilatation of the main duct. No intraparenchymal cyst. No peripancreatic edema.  Stomach/Bowel: Nondistended. No gastric wall thickening. No evidence of outlet obstruction. Duodenum is normally positioned as is the ligament of Treitz. Duodenum diverticulum noted. No small bowel wall thickening. No small bowel dilatation. Terminal ileum and appendix are normal. In diverticular change noted in the left colon without diverticulitis.  Adrenals/urinary tract: No adrenal nodule or mass. No renal, ureteral, or bladder stones. No enhancing lesion in either kidney. No hydronephrosis.  Vascular/Lymphatic: Atherosclerotic calcification is noted in the wall of the abdominal aorta  without aneurysm. No pelvic sidewall lymphadenopathy. No abdominal lymphadenopathy. Portal vein and superior mesenteric vein are patent.  Reproductive: Uterus is unremarkable.  No adnexal  mass.  Musculoskeletal: Bone windows reveal no worrisome lytic or sclerotic osseous lesions. Gas in the subcutaneous fat of the left flank region is presumably secondary to an injection site.  Other: No intraperitoneal free fluid.  IMPRESSION: Unremarkable CT evaluation of the abdomen and pelvis. Specifically a, no findings to explain the patient's history of right lower quadrant pain.  Left colonic diverticulosis without diverticulitis.   Electronically Signed   By: Misty Stanley M.D.   On: 09/20/2014 15:20   US Transvaginal Non-ob  09/20/2014   CLINICAL DATA:  Pelvic pain and intermittent vaginal spotting  EXAM: TRANSABDOMINAL AND TRANSVAGINAL ULTRASOUND OF PELVIS  DOPPLER ULTRASOUND OF OVARIES  TECHNIQUE: Both transabdominal and transvaginal ultrasound examinations of the pelvis were performed. Transabdominal technique was performed for global imaging of the pelvis including uterus, ovaries, adnexal regions, and pelvic cul-de-sac.  Transvaginal study was performed to optimize pelvic field of view evaluation. Doppler interrogation of the ovaries was also performed.  COMPARISON:  CT abdomen and pelvis September 20, 2014  FINDINGS: Uterus  Measurements: 7.2 x 3.7 x 4.4 cm. No fibroids or other mass visualized. Uterus is anteverted.  Endometrium  Thickness:  5 mm   No focal abnormality visualized.  Right ovary  Measurements: 1.8 x 1.8 x 1.4 cm. Normal appearance/no adnexal mass.  Left ovary  Measurements: 1.9 x 1.2 x 1.4 cm. Normal appearance/no adnexal mass.  Pulsed Doppler evaluation demonstrates a low resistance waveform on the left with a peak systolic velocity of 6 cm/sec. On the right, flow is seen. The waveform appears high resistance. There is essentially no demonstrable diastolic flow on the right. The peak systolic  velocity on the right is 5 cm/sec. Venous outflow is demonstrated bilaterally.  Other findings  No free fluid.  IMPRESSION: The pulsed Doppler waveform for the right ovary is high resistance, abnormal. This finding can be seen with intermittent torsion. Torsion at this time is not felt to be present given flow demonstrable in the right ovary. The left ovarian waveform is low resistance, normal. Intermittent torsion on the right, however, is not excluded with this study.  There is no evidence of ovarian enlargement, mass, or edema. There is no surrounding fluid. No morphologic signs suggesting torsion present. Study otherwise unremarkable.   Electronically Signed   By: Lowella Grip M.D.   On: 09/20/2014 18:14   US Pelvis Complete  09/20/2014   CLINICAL DATA:  Pelvic pain and intermittent vaginal spotting  EXAM: TRANSABDOMINAL AND TRANSVAGINAL ULTRASOUND OF PELVIS  DOPPLER ULTRASOUND OF OVARIES  TECHNIQUE: Both transabdominal and transvaginal ultrasound examinations of the pelvis were performed. Transabdominal technique was performed for global imaging of the pelvis including uterus, ovaries, adnexal regions, and pelvic cul-de-sac.  Transvaginal study was performed to optimize pelvic field of view evaluation. Doppler interrogation of the ovaries was also performed.  COMPARISON:  CT abdomen and pelvis September 20, 2014  FINDINGS: Uterus  Measurements: 7.2 x 3.7 x 4.4 cm. No fibroids or other mass visualized. Uterus is anteverted.  Endometrium  Thickness:  5 mm   No focal abnormality visualized.  Right ovary  Measurements: 1.8 x 1.8 x 1.4 cm. Normal appearance/no adnexal mass.  Left ovary  Measurements: 1.9 x 1.2 x 1.4 cm. Normal appearance/no adnexal mass.  Pulsed Doppler evaluation demonstrates a low resistance waveform on the left with a peak systolic velocity of 6 cm/sec. On the right, flow is seen. The waveform appears high resistance. There is essentially no demonstrable diastolic flow on the right. The  peak  systolic velocity on the right is 5 cm/sec. Venous outflow is demonstrated bilaterally.  Other findings  No free fluid.  IMPRESSION: The pulsed Doppler waveform for the right ovary is high resistance, abnormal. This finding can be seen with intermittent torsion. Torsion at this time is not felt to be present given flow demonstrable in the right ovary. The left ovarian waveform is low resistance, normal. Intermittent torsion on the right, however, is not excluded with this study.  There is no evidence of ovarian enlargement, mass, or edema. There is no surrounding fluid. No morphologic signs suggesting torsion present. Study otherwise unremarkable.   Electronically Signed   By: Lowella Grip M.D.   On: 09/20/2014 18:14   [place] Art/ven Flow Abd Pelv Doppler  09/20/2014   CLINICAL DATA:  Pelvic pain and intermittent vaginal spotting  EXAM: TRANSABDOMINAL AND TRANSVAGINAL ULTRASOUND OF PELVIS  DOPPLER ULTRASOUND OF OVARIES  TECHNIQUE: Both transabdominal and transvaginal ultrasound examinations of the pelvis were performed. Transabdominal technique was performed for global imaging of the pelvis including uterus, ovaries, adnexal regions, and pelvic cul-de-sac.  Transvaginal study was performed to optimize pelvic field of view evaluation. Doppler interrogation of the ovaries was also performed.  COMPARISON:  CT abdomen and pelvis September 20, 2014  FINDINGS: Uterus  Measurements: 7.2 x 3.7 x 4.4 cm. No fibroids or other mass visualized. Uterus is anteverted.  Endometrium  Thickness:  5 mm   No focal abnormality visualized.  Right ovary  Measurements: 1.8 x 1.8 x 1.4 cm. Normal appearance/no adnexal mass.  Left ovary  Measurements: 1.9 x 1.2 x 1.4 cm. Normal appearance/no adnexal mass.  Pulsed Doppler evaluation demonstrates a low resistance waveform on the left with a peak systolic velocity of 6 cm/sec. On the right, flow is seen. The waveform appears high resistance. There is essentially no demonstrable  diastolic flow on the right. The peak systolic velocity on the right is 5 cm/sec. Venous outflow is demonstrated bilaterally.  Other findings  No free fluid.  IMPRESSION: The pulsed Doppler waveform for the right ovary is high resistance, abnormal. This finding can be seen with intermittent torsion. Torsion at this time is not felt to be present given flow demonstrable in the right ovary. The left ovarian waveform is low resistance, normal. Intermittent torsion on the right, however, is not excluded with this study.  There is no evidence of ovarian enlargement, mass, or edema. There is no surrounding fluid. No morphologic signs suggesting torsion present. Study otherwise unremarkable.   Electronically Signed   By: Lowella Grip M.D.   On: 09/20/2014 18:14    Review of Systems  Constitutional: Negative for fever and chills.  Gastrointestinal: Positive for abdominal pain (+Right lower quadrant pain ). Negative for nausea, vomiting, diarrhea and constipation.  Genitourinary: Negative for dysuria, urgency, frequency and hematuria.       No vaginal discharge. No vaginal bleeding. No dysuria.    Physical Exam   Blood pressure 149/77, pulse 60, temperature 98.2 F (36.8 C), temperature source Oral, resp. rate 16, height 5' 0.5" (1.537 m), weight 84.278 kg (185 lb 12.8 oz), SpO2 100.00%.  Physical Exam  Constitutional: She is oriented to person, place, and time. She appears well-developed and well-nourished.  Non-toxic appearance. She does not have a sickly appearance. She does not appear ill. No distress.  HENT:  Head: Normocephalic.  Eyes: Pupils are equal, round, and reactive to light.  Neck: Neck supple.  Respiratory: Effort normal.  GI: Soft. She exhibits no distension. There is  no tenderness. There is no rigidity, no rebound, no guarding and no tenderness at McBurney's point.    Genitourinary:  Bimanual exam: Cervix closed Uterus non tender, normal size Adnexa non tender, no masses  bilaterally Chaperone present for exam. Patient states the pain is worse following the bimanual exam.    Musculoskeletal: Normal range of motion.  Neurological: She is alert and oriented to person, place, and time.  Skin: Skin is warm. She is not diaphoretic.  Psychiatric: Her behavior is normal.    MAU Course  Procedures None  MDM -CT scan negative for adnexal mass, or kidney stone -Patient insists that this pain is not musculoskeletal pain, I called Dr. Quincy Simmonds and left a message; will order an Korea to assess the right ovary and rule out ovarian torsion with doppler studies.  -Toradol 60 mg IM: patient states the pain medication worked for a while and then wore off.  -Discussed CT scan results with Dr. Quincy Simmonds. Unlikely ovarian related, however patient is very nervous about whether this pain is related to her ovary. Korea ordered.  -Korea results discussed with Dr. Quincy Simmonds. Intermittent torsion on the right ovary per Korea. Unlikely right ovarian torsion; no mass, enlargement or edema, the patient is not in distress. The pain fluctuates. The plan is to treat the patient's pain and have her follow up with Dr. Quincy Simmonds in 2 days. The patient will be given good instructions to come back if the pain is not controled with Vicodin or if the pain worsens. Dr. Quincy Simmonds is agreeable to the plan as is the patient.    Assessment and Plan   A: 1. Abdominal pain in female    P: Discharge home in stable condition RX: Vicodin Return to MAU if pain worsens Follow up with Dr. Quincy Simmonds in 2 days; call the office to schedule  Darrelyn Hillock Rogue Pautler, NP  09/22/2014, 8:27 AM

## 2014-09-20 NOTE — Telephone Encounter (Signed)
Spoke with patient. Advised will need to see NP today and Dr.Silva will be available for review of everything. Patient is agreeable. Appointment scheduled for today at 1:45pm with Rebekah [name], [place] (time per Gay Filler). Patient agreeable to date and time.  Routing to [employer], FNP as seeing patient. Cc: Dr.Silva  Routing to provider for final review. Patient agreeable to disposition. Will close encounter

## 2014-09-21 ENCOUNTER — Encounter: Payer: Self-pay | Admitting: Physician Assistant

## 2014-09-21 ENCOUNTER — Telehealth: Payer: Self-pay | Admitting: Obstetrics and Gynecology

## 2014-09-21 DIAGNOSIS — IMO0002 Reserved for concepts with insufficient information to code with codable children: Secondary | ICD-10-CM | POA: Insufficient documentation

## 2014-09-21 NOTE — Telephone Encounter (Signed)
Pt says she is calling kaityln back i dont see message from Jal

## 2014-09-21 NOTE — Telephone Encounter (Signed)
Spoke with patient. Patient states that she was told at the hospital yesterday that Dr.Silva wanted her to come in for follow up Wednesday morning to discuss everything from hospital visit on 9/21 due to pelvic pain. Appointment scheduled for tomorrow at 10:30am with Dr.Silva. Patient agreeable and verbalizes understanding. States that she went to work today as she is able to sit with her job. Has taken hydrocodone twice since leaving the hospital and feels better until it wears off.  Will seek care again if anything changes before seeing Dr.Silva tomorrow.  Routing to provider for final review. Patient agreeable to disposition. Will close encounter

## 2014-09-21 NOTE — Telephone Encounter (Addendum)
Patient was told to schedule a follow up appointment with Dr.Silva tomorrow. Patient says she had labs drawn at the Hospital yesterday and is not sure if this is a follow up to labs?

## 2014-09-22 ENCOUNTER — Encounter: Payer: Self-pay | Admitting: Obstetrics and Gynecology

## 2014-09-22 ENCOUNTER — Ambulatory Visit (INDEPENDENT_AMBULATORY_CARE_PROVIDER_SITE_OTHER): Payer: Federal, State, Local not specified - PPO | Admitting: Obstetrics and Gynecology

## 2014-09-22 VITALS — BP 118/60 | HR 74 | Resp 12 | Ht 60.5 in | Wt 186.0 lb

## 2014-09-22 DIAGNOSIS — R1031 Right lower quadrant pain: Secondary | ICD-10-CM

## 2014-09-22 DIAGNOSIS — R109 Unspecified abdominal pain: Secondary | ICD-10-CM

## 2014-09-22 DIAGNOSIS — R3129 Other microscopic hematuria: Secondary | ICD-10-CM

## 2014-09-22 DIAGNOSIS — G8929 Other chronic pain: Secondary | ICD-10-CM

## 2014-09-22 MED ORDER — IBUPROFEN 800 MG PO TABS: 800.0000 mg | ORAL_TABLET | Freq: Three times a day (TID) | ORAL | Status: DC | PRN

## 2014-09-22 NOTE — MAU Provider Note (Signed)
Care discussed with me by phone.  Encounter reviewed by Dr. Josefa Half.

## 2014-09-22 NOTE — Progress Notes (Signed)
Patient ID: Rebekah [name], female   DOB: December 03, 1955, 59 y.o.   MRN: [id number]  GYNECOLOGY  VISIT   HPI: 59 y.o.   Married  Caucasian  female   [phone] with No LMP recorded. Patient is postmenopausal.   here for recheck of RLQ pain.  Had a competition of cross fit with weight lifting, box jumps on 09/18/14.   Then started to have right groin pain.  That night felt the need to stretch on her right side.  Then next night had pain that became more significant.  Pain feels like a pressure that comes and goes.  Some mild nausea this am.  No vomiting.  No fever.  Some hot flashes.  Some loose bowel movements lately. No dysuria.  No hematuria.  History of possible renal stones in the past.  Saw urology in [place] years ago.  Due to the pain, patient to the hospital 2 days ago and had a CT of the abdomen which showed a normal appendix, left diverticulosis, and normal pelvis.  NO renal stones.  The ultrasound showed intermittent torsion of the right ovary.  Had no acute abdomen and was discharged to home with pain medication.  Did not take any pain medication yesterday and went to work.   Does take Zyflamend herbal antiinflammatory.   GYNECOLOGIC HISTORY: No LMP recorded. Patient is postmenopausal. Contraception:    Menopausal hormone therapy:         OB History   Grav Para Term Preterm Abortions TAB SAB Ect Mult Living   3 2 2  1     1          Patient Active Problem List   Diagnosis Date Noted  . Squamous cell carcinoma 09/21/2014  . Chronic allergic rhinitis 05/30/2014  . Elevated LDL cholesterol level 04/14/2014  . Allergic rhinitis 04/14/2014  . Seasonal allergies 04/14/2014  . OAB (overactive bladder) 04/14/2014  . ALLERGIC RHINITIS, SEASONAL 02/23/2010    Past Medical History  Diagnosis Date  . Perimenopause   . Hyperlipidemia     Past Surgical History  Procedure Laterality Date  . Breast surgery    . Knee surgery Bilateral   . Refractive surgery Bilateral      Current Outpatient Prescriptions  Medication Sig Dispense Refill  . Brimonidine Tartrate (MIRVASO) 0.33 % GEL Apply topically as needed.      Marland Kitchen HYDROcodone-acetaminophen (NORCO/VICODIN) 5-325 MG per tablet Take 1-2 tablets by mouth every 4 (four) hours as needed for moderate pain or severe pain.  15 tablet  0  . hydrocortisone (ANUSOL-HC) 2.5 % rectal cream Place 1 application rectally 2 (two) times daily.      Marland Kitchen levocetirizine (XYZAL) 5 MG tablet Take 1 tablet (5 mg total) by mouth every evening.  30 tablet  4  . metroNIDAZOLE (METROGEL) 1 % gel Apply topically as needed.      . mirabegron ER (MYRBETRIQ) 25 MG TB24 tablet Take 1 tablet (25 mg total) by mouth daily.  90 tablet  1  . montelukast (SINGULAIR) 10 MG tablet Take 10 mg by mouth at bedtime.      . Olopatadine HCl (PATADAY) 0.2 % SOLN Place 1 drop into both eyes 2 (two) times daily.      Marland Kitchen OVER THE COUNTER MEDICATION 2 (two) times daily. Zyflamend      . UNABLE TO FIND Med Name: Allergy Shots      . vitamin B-12 (CYANOCOBALAMIN) 1000 MCG tablet Take 1,000 mcg by mouth daily.  No current facility-administered medications for this visit.     ALLERGIES: Dust mite extract  Family History  Problem Relation Age of Onset  . Hypertension Mother   . Stroke Mother   . Cancer Mother   . Hyperlipidemia Mother   . Hypertension Father   . Cancer Father   . Hyperlipidemia Father   . Anemia Sister   . Diabetes Brother     History   Social History  . Marital Status: Married    Spouse Name: N/A    Number of Children: 2  . Years of Education: N/A   Occupational History  . Not on file.   Social History Main Topics  . Smoking status: Never Smoker   . Smokeless tobacco: Never Used  . Alcohol Use: 1.5 - 2.0 oz/week    3-4 drink(s) per week  . Drug Use: No  . Sexual Activity: No   Other Topics Concern  . Not on file   Social History Narrative  . No narrative on file    ROS:  Pertinent items are noted in  HPI.  PHYSICAL EXAMINATION:    BP 118/60  Pulse 74  Resp 12  Ht 5' 0.5" (1.537 m)  Wt 186 lb (84.369 kg)  BMI 35.71 kg/m2     General appearance: alert, cooperative and appears stated age Lungs: clear to auscultation bilaterally Heart: regular rate and rhythm Abdomen: soft, non-tender; no masses,  no organomegaly No abnormal inguinal nodes palpated  Pelvic: External genitalia:  no lesions              Urethra:  normal appearing urethra with no masses, tenderness or lesions              Bartholins and Skenes: normal                 Vagina: normal appearing vagina with normal color and discharge, no lesions              Cervix: normal appearance                   Bimanual Exam:  Uterus:  uterus is normal size, shape, consistency and nontender                                      Adnexa: normal adnexa in size, nontender and no masses                                      Rectovaginal:  Yes.                                                                Confirms                                      Anus:  normal sphincter tone, no lesions  ASSESSMENT  Right lower quadrant versus right groin pain.  Microscopic hematuria.  Family history of ovarian cancer. Increased resistance on right ovary with pelvic ultrasound.   PLAN  Urinalysis  and culture.  Repeat pelvic ultrasound in 2 weeks at [place] 800 mg po q 8 hours prn.  Heating pad.  Rest.  If pain worsens, will do ultrasound sooner. If persistent groin pain, will have patient follow up with PCP.     An After Visit Summary was printed and given to the patient.  _25_____ minutes face to face time of which over 50% was spent in counseling.

## 2014-09-23 LAB — URINALYSIS, MICROSCOPIC ONLY
Bacteria, UA: NONE SEEN
Casts: NONE SEEN
Crystals: NONE SEEN
Squamous Epithelial / LPF: NONE SEEN

## 2014-09-23 LAB — URINE CULTURE

## 2014-09-24 ENCOUNTER — Encounter: Payer: Self-pay | Admitting: Obstetrics and Gynecology

## 2014-09-27 ENCOUNTER — Telehealth: Payer: Self-pay | Admitting: Emergency Medicine

## 2014-09-27 DIAGNOSIS — Z09 Encounter for follow-up examination after completed treatment for conditions other than malignant neoplasm: Secondary | ICD-10-CM

## 2014-09-27 NOTE — Telephone Encounter (Signed)
Ultrasound at [facility] in [facility] and pelvic ultrasound non-ob orders be placed. Orders placed.    10/12/14 at 0730-arrive with full bladder.   Patient notified and agreeable to appointment and instructions as scheduled.   Routing to provider for final review. Patient agreeable to disposition. Will close encounter

## 2014-09-27 NOTE — Telephone Encounter (Signed)
Message copied by Michele Mcalpine on Mon Sep 27, 2014 10:09 AM ------      Message from: [place], [place]: Fri Sep 24, 2014  4:32 PM      Regarding: please schedule followup ultrasound at [facility] for 2 weeks       Hello Team,            Will you please schedule a follow up pelvic ultrasound for my patient.            It is in the work que for RLQ pain and a prior ultrasound showing intermittent torsion.             Thanks. ------

## 2014-10-06 ENCOUNTER — Other Ambulatory Visit: Payer: Self-pay | Admitting: Physician Assistant

## 2014-10-12 ENCOUNTER — Telehealth: Payer: Self-pay | Admitting: Emergency Medicine

## 2014-10-12 ENCOUNTER — Ambulatory Visit (HOSPITAL_COMMUNITY)
Admission: RE | Admit: 2014-10-12 | Discharge: 2014-10-12 | Disposition: A | Discharge status: Home / Self Care | Payer: Federal, State, Local not specified - PPO | Source: Ambulatory Visit | Attending: Obstetrics and Gynecology | Admitting: Obstetrics and Gynecology

## 2014-10-12 ENCOUNTER — Other Ambulatory Visit: Payer: Self-pay | Admitting: Obstetrics and Gynecology

## 2014-10-12 DIAGNOSIS — R1030 Lower abdominal pain, unspecified: Secondary | ICD-10-CM | POA: Diagnosis not present

## 2014-10-12 DIAGNOSIS — Z09 Encounter for follow-up examination after completed treatment for conditions other than malignant neoplasm: Secondary | ICD-10-CM

## 2014-10-12 DIAGNOSIS — Z780 Asymptomatic menopausal state: Secondary | ICD-10-CM | POA: Insufficient documentation

## 2014-10-12 DIAGNOSIS — R1031 Right lower quadrant pain: Secondary | ICD-10-CM | POA: Insufficient documentation

## 2014-10-12 DIAGNOSIS — G8929 Other chronic pain: Secondary | ICD-10-CM

## 2014-10-12 NOTE — Telephone Encounter (Signed)
Spoke with patient.  She is advised of message from Dr. Quincy Simmonds.  She is agreeable to plan, will call back with any further pain or bleeding. Verbalizes understanding of instructions.   Patient has questions about visit to Maternal Admissions Unit at [facility]. Patient states that it has been close to $10,000.00 for the bills for the treatment and states it is being billed as an emergency room visit. I advised this Maternal Admissions Unit at [facility] takes care of patient's on an emergency basis and that it was recommended by Dr. Quincy Simmonds that she be evaluated there because of need for possible imaging r/t her complaints of pain. Patient had CT as well as Pelvic ultrasound and lab work.  Advised that the imaging done while inpatient is usually more costly than done while on outpatient basis. Recommended she discuss with insurance company or billing at [facility] if has specific questions. Patient agreeable.   Routing to provider for final review. Patient agreeable to disposition. Will close encounter

## 2014-10-12 NOTE — Telephone Encounter (Signed)
Message copied by Michele Mcalpine on Tue Oct 12, 2014  3:55 PM ------      Message from: [place], BROOK E      Created: Tue Oct 12, 2014  2:38 PM       Please let patient know that her pelvic ultrasound was completely normal!      The blood flow to the ovaries was normal.      Please have her return if she is experiencing any further groin/right lower quadrant pain. ------

## 2014-10-21 ENCOUNTER — Other Ambulatory Visit: Payer: Self-pay | Admitting: Physician Assistant

## 2014-10-21 ENCOUNTER — Encounter: Payer: Self-pay | Admitting: Physician Assistant

## 2014-10-21 MED ORDER — MIRABEGRON ER 50 MG PO TB24: 50.0000 mg | ORAL_TABLET | Freq: Every day | ORAL | Status: DC

## 2014-11-01 ENCOUNTER — Encounter: Payer: Self-pay | Admitting: Obstetrics and Gynecology

## 2014-11-28 ENCOUNTER — Other Ambulatory Visit: Payer: Self-pay | Admitting: Physician Assistant

## 2015-01-28 ENCOUNTER — Other Ambulatory Visit: Payer: Self-pay | Admitting: Physician Assistant

## 2015-01-28 ENCOUNTER — Other Ambulatory Visit: Payer: Self-pay | Admitting: *Deleted

## 2015-02-04 ENCOUNTER — Telehealth: Payer: Self-pay

## 2015-02-04 MED ORDER — BECLOMETHASONE DIPROPIONATE 80 MCG/ACT NA AERS: 2.0000 | INHALATION_SPRAY | Freq: Every day | NASAL | Status: DC | PRN

## 2015-02-04 NOTE — Telephone Encounter (Signed)
Patient walked in and requested a refill on her nasal spray. I advised her that a nasal spray was not on her current medication list. Please advise. [place].

## 2015-02-04 NOTE — Telephone Encounter (Signed)
Qnasl was the last thing I gave her. Confirm this is what she is requesting. If so ok for 2 sprays each nostril daily with 5 refills.

## 2015-02-04 NOTE — Telephone Encounter (Signed)
Sent rx to [facility] as requested.

## 2015-02-08 ENCOUNTER — Ambulatory Visit (INDEPENDENT_AMBULATORY_CARE_PROVIDER_SITE_OTHER): Payer: Federal, State, Local not specified - PPO | Admitting: Physician Assistant

## 2015-02-08 ENCOUNTER — Encounter: Payer: Self-pay | Admitting: Physician Assistant

## 2015-02-08 VITALS — BP 143/69 | HR 55 | Ht 60.0 in | Wt 189.0 lb

## 2015-02-08 DIAGNOSIS — E785 Hyperlipidemia, unspecified: Secondary | ICD-10-CM

## 2015-02-08 DIAGNOSIS — Z23 Encounter for immunization: Secondary | ICD-10-CM | POA: Diagnosis not present

## 2015-02-08 DIAGNOSIS — Z131 Encounter for screening for diabetes mellitus: Secondary | ICD-10-CM

## 2015-02-08 DIAGNOSIS — Z0000 Encounter for general adult medical examination without abnormal findings: Secondary | ICD-10-CM | POA: Diagnosis not present

## 2015-02-08 MED ORDER — LEVOCETIRIZINE DIHYDROCHLORIDE 5 MG PO TABS: ORAL_TABLET | ORAL | Status: DC

## 2015-02-08 MED ORDER — MONTELUKAST SODIUM 10 MG PO TABS: ORAL_TABLET | ORAL | Status: DC

## 2015-02-08 MED ORDER — MIRABEGRON ER 50 MG PO TB24: 50.0000 mg | ORAL_TABLET | Freq: Every day | ORAL | Status: DC

## 2015-02-08 NOTE — Patient Instructions (Signed)
Needs colonoscopy.   Keeping You Healthy  Get These Tests  Blood Pressure- Have your blood pressure checked by your healthcare provider at least once a year.  Normal blood pressure is 120/80.  Weight- Have your body mass index (BMI) calculated to screen for obesity.  BMI is a measure of body fat based on height and weight.  You can calculate your own BMI at [employer].it  Cholesterol- Have your cholesterol checked every year.  Diabetes- Have your blood sugar checked every year if you have high blood pressure, high cholesterol, a family history of diabetes or if you are overweight.  Pap Smear- Have a pap smear every 1 to 3 years if you have been sexually active.  If you are older than 65 and recent pap smears have been normal you may not need additional pap smears.  In addition, if you have had a hysterectomy  For benign disease additional pap smears are not necessary.  Mammogram-Yearly mammograms are essential for early detection of breast cancer  Screening for Colon Cancer- Colonoscopy starting at age 52. Screening may begin sooner depending on your family history and other health conditions.  Follow up colonoscopy as directed by your Gastroenterologist.  Screening for Osteoporosis- Screening begins at age 45 with bone density scanning, sooner if you are at higher risk for developing Osteoporosis.  Get these medicines  Calcium with Vitamin D- Your body requires 1200-1500 mg of Calcium a day and [phone] IU of Vitamin D a day.  You can only absorb 500 mg of Calcium at a time therefore Calcium must be taken in 2 or 3 separate doses throughout the day.  Hormones- Hormone therapy has been associated with increased risk for certain cancers and heart disease.  Talk to your healthcare provider about if you need relief from menopausal symptoms.  Aspirin- Ask your healthcare provider about taking Aspirin to prevent Heart Disease and Stroke.  Get these Immuniztions  Flu shot- Every  fall  Pneumonia shot- Once after the age of 19; if you are younger ask your healthcare provider if you need a pneumonia shot.  Tetanus- Every ten years.  Zostavax- Once after the age of 66 to prevent shingles.  Take these steps  Don't smoke- Your healthcare provider can help you quit. For tips on how to quit, ask your healthcare provider or go to www.smokefree.gov or call 1-800 QUIT-NOW.  Be physically active- Exercise 5 days a week for a minimum of 30 minutes.  If you are not already physically active, start slow and gradually work up to 30 minutes of moderate physical activity.  Try walking, dancing, bike riding, swimming, etc.  Eat a healthy diet- Eat a variety of healthy foods such as fruits, vegetables, whole grains, low fat milk, low fat cheeses, yogurt, lean meats, chicken, fish, eggs, dried beans, tofu, etc.  For more information go to www.thenutritionsource.org  Dental visit- Brush and floss teeth twice daily; visit your dentist twice a year.  Eye exam- Visit your Optometrist or Ophthalmologist yearly.  Drink alcohol in moderation- Limit alcohol intake to one drink or less a day.  Never drink and drive.  Depression- Your emotional health is as important as your physical health.  If you're feeling down or losing interest in things you normally enjoy, please talk to your healthcare provider.  Seat Belts- can save your life; always wear one  Smoke/Carbon Monoxide detectors- These detectors need to be installed on the appropriate level of your home.  Replace batteries at least once a year.  Violence- If anyone is threatening or hurting you, please tell your healthcare provider.  Living Will/ Health care power of attorney- Discuss with your healthcare provider and family.

## 2015-02-09 ENCOUNTER — Telehealth: Payer: Self-pay | Admitting: Physician Assistant

## 2015-02-09 ENCOUNTER — Encounter: Payer: Self-pay | Admitting: Obstetrics and Gynecology

## 2015-02-09 ENCOUNTER — Ambulatory Visit (INDEPENDENT_AMBULATORY_CARE_PROVIDER_SITE_OTHER): Payer: Federal, State, Local not specified - PPO | Admitting: Obstetrics and Gynecology

## 2015-02-09 VITALS — BP 140/78 | HR 70 | Resp 16 | Ht 60.0 in | Wt 188.0 lb

## 2015-02-09 DIAGNOSIS — Z0000 Encounter for general adult medical examination without abnormal findings: Secondary | ICD-10-CM

## 2015-02-09 DIAGNOSIS — R319 Hematuria, unspecified: Secondary | ICD-10-CM

## 2015-02-09 DIAGNOSIS — Z01419 Encounter for gynecological examination (general) (routine) without abnormal findings: Secondary | ICD-10-CM

## 2015-02-09 LAB — COMPLETE METABOLIC PANEL WITH GFR
ALBUMIN: 4.2 g/dL (ref 3.5–5.2)
ALT: 24 U/L (ref 0–35)
AST: 17 U/L (ref 0–37)
Alkaline Phosphatase: 64 U/L (ref 39–117)
BUN: 15 mg/dL (ref 6–23)
CALCIUM: 9.4 mg/dL (ref 8.4–10.5)
CO2: 29 meq/L (ref 19–32)
CREATININE: 0.73 mg/dL (ref 0.50–1.10)
Chloride: 105 mEq/L (ref 96–112)
GLUCOSE: 89 mg/dL (ref 70–99)
Potassium: 4.1 mEq/L (ref 3.5–5.3)
Sodium: 144 mEq/L (ref 135–145)
Total Bilirubin: 0.6 mg/dL (ref 0.2–1.2)
Total Protein: 6.7 g/dL (ref 6.0–8.3)

## 2015-02-09 LAB — LIPID PANEL
Cholesterol: 236 mg/dL — ABNORMAL HIGH (ref 0–200)
HDL: 48 mg/dL (ref >39)
LDL CALC: 163 mg/dL — AB (ref 0–99)
Total CHOL/HDL Ratio: 4.9 Ratio
Triglycerides: 125 mg/dL (ref <150)
VLDL: 25 mg/dL (ref 0–40)

## 2015-02-09 LAB — POCT URINALYSIS DIPSTICK
Bilirubin, UA: NEGATIVE
Glucose, UA: NEGATIVE
Ketones, UA: NEGATIVE
LEUKOCYTES UA: NEGATIVE
Nitrite, UA: NEGATIVE
PROTEIN UA: NEGATIVE
UROBILINOGEN UA: NEGATIVE
pH, UA: 5

## 2015-02-09 LAB — CBC
HCT: 42.7 % (ref 36.0–46.0)
Hemoglobin: 14 g/dL (ref 12.0–15.0)
MCH: 28.4 pg (ref 26.0–34.0)
MCHC: 32.8 g/dL (ref 30.0–36.0)
MCV: 86.6 fL (ref 78.0–100.0)
MPV: 11.3 fL (ref 8.6–12.4)
PLATELETS: 202 10*3/uL (ref 150–400)
RBC: 4.93 MIL/uL (ref 3.87–5.11)
RDW: 14.5 % (ref 11.5–15.5)
WBC: 7 10*3/uL (ref 4.0–10.5)

## 2015-02-09 NOTE — Patient Instructions (Signed)

## 2015-02-09 NOTE — Telephone Encounter (Signed)
Patient said that generic Rebekah [name] is not working. Would you suggest something else for her to try? [facility] in [place].  thanks

## 2015-02-09 NOTE — Progress Notes (Signed)
Patient ID: Rebekah [name], female   DOB: Jan 14, 1955, 60 y.o.   MRN: [id number] 59 y.o. [id number] WidowedCaucasianF here for annual exam.    Now on Myrbetriq from PCP.   Losing insurance February 28, 2023 due to husband's death.  Diabetes and complications.  Some sleep disturbance.  Uses CPAP machine.  Takes Ambien.   Has occasional right sided pain.  Nothing significant.  Had an ultrasound showing intermittent torsion of the ovary, which on follow up ultrasound resolved.  No bleeding or spotting.   Will have a new insurance policy in the future.   No LMP recorded. Patient is postmenopausal.          Sexually active: No.  The current method of family planning is post menopausal status.    Exercising: No.  none. Smoker:  no  Health Maintenance: Pap:  01-16-13 wnl:neg HR HPV History of abnormal Pap:  Yes, had colposcopy and cryotherapy to cervix in 1992 and paps normal since. MMG:  04-09-14 heteroeneously dense/nl:[facility].  Hx bilateral breast reduction in 2001 and benign right breast biopsy. Colonoscopy:  2008 normal at [facility] now. BMD:   never TDaP:  02-08-15 Screening Labs:  Hb today: PCP Urine today: 1+ RBC's--odor   reports that she has never smoked. She has never used smokeless tobacco. She reports that she drinks about 1.8 - 2.4 oz of alcohol per week. She reports that she does not use illicit drugs.  Past Medical History  Diagnosis Date  . Perimenopause   . Hyperlipidemia   . Allergy     --takes allergy injections    Past Surgical History  Procedure Laterality Date  . Breast surgery    . Knee surgery Bilateral   . Refractive surgery Bilateral     Current Outpatient Prescriptions  Medication Sig Dispense Refill  . Beclomethasone Dipropionate 80 MCG/ACT AERS Place 2 sprays into the nose daily as needed. 26.1 g 1  . Brimonidine Tartrate (MIRVASO) 0.33 % GEL Apply topically as needed.    Marland Kitchen HYDROcodone-acetaminophen (NORCO/VICODIN) 5-325 MG per tablet  Take 1-2 tablets by mouth every 4 (four) hours as needed for moderate pain or severe pain. 15 tablet 0  . hydrocortisone (ANUSOL-HC) 2.5 % rectal cream Place 1 application rectally 2 (two) times daily.    Marland Kitchen ibuprofen (ADVIL,MOTRIN) 800 MG tablet Take 1 tablet (800 mg total) by mouth every 8 (eight) hours as needed. 30 tablet 0  . levocetirizine (XYZAL) 5 MG tablet TAKE 1 TABLET (5 MG TOTAL) BY MOUTH EVERY EVENING. 90 tablet 4  . metroNIDAZOLE (METROGEL) 1 % gel Apply topically as needed.    . mirabegron ER (MYRBETRIQ) 50 MG TB24 tablet Take 1 tablet (50 mg total) by mouth daily. 90 tablet 1  . montelukast (SINGULAIR) 10 MG tablet TAKE 1 TABLET (10 MG TOTAL) BY MOUTH AT BEDTIME. 90 tablet 4  . Olopatadine HCl (PATADAY) 0.2 % SOLN Place 1 drop into both eyes 2 (two) times daily.    Marland Kitchen OVER THE COUNTER MEDICATION 2 (two) times daily. Zyflamend    . PROAIR RESPICLICK 676 (90 BASE) MCG/ACT AEPB   3  . UNABLE TO FIND Med Name: Allergy Shots    . vitamin B-12 (CYANOCOBALAMIN) 1000 MCG tablet Take 1,000 mcg by mouth daily.    Marland Kitchen azelastine (ASTELIN) 0.1 % nasal spray   5   No current facility-administered medications for this visit.    Family History  Problem Relation Age of Onset  . Hypertension Mother   .  Stroke Mother   . Cancer Mother 22    ovarian ca, hx of TTP  . Hyperlipidemia Mother   . Ovarian cancer Mother   . Hypertension Father   . Cancer Father 23    bladder ca  . Hyperlipidemia Father   . Anemia Sister   . Diabetes Brother     ROS:  Pertinent items are noted in HPI.  Otherwise, a comprehensive ROS was negative.  Exam:   BP 140/78 mmHg  Pulse 70  Resp 16  Ht 5' (1.524 m)  Wt 188 lb (85.276 kg)  BMI 36.72 kg/m2     Height: 5' (152.4 cm)  Ht Readings from Last 3 Encounters:  02/09/15 5' (1.524 m)  02/08/15 5' (1.524 m)  09/22/14 5' 0.5" (1.537 m)    General appearance: alert, cooperative and appears stated age Head: Normocephalic, without obvious abnormality,  atraumatic Neck: no adenopathy, supple, symmetrical, trachea midline and thyroid normal to inspection and palpation Lungs: clear to auscultation bilaterally Breasts: normal appearance, no masses or tenderness, No nipple retraction or dimpling, No nipple discharge or bleeding, No axillary or supraclavicular adenopathy, Consistent with bilateral reduction.  Heart: regular rate and rhythm Abdomen: soft, non-tender; bowel sounds normal; no masses,  no organomegaly Extremities: extremities normal, atraumatic, no cyanosis or edema Skin: Skin color, texture, turgor normal. No rashes or lesions Lymph nodes: Cervical, supraclavicular, and axillary nodes normal. No abnormal inguinal nodes palpated Neurologic: Grossly normal   Pelvic: External genitalia:  no lesions              Urethra:  normal appearing urethra with no masses, tenderness or lesions              Bartholins and Skenes: normal                 Vagina: normal appearing vagina with normal color and discharge, no lesions              Cervix: no lesions              Pap taken: Yes.   Bimanual Exam:  Uterus:  normal size, contour, position, consistency, mobility, non-tender              Adnexa: no mass, fullness, tenderness               Rectovaginal: Confirms               Anus:  normal sphincter tone, no lesions  Chaperone was present for exam.  A:  Well Woman with normal exam Bereavement.  Microscopic hematuria.   P:   Mammogram yearly.  pap smear and HR HPV performed.  Support given.  Urine micro and culture.  return annually or prn

## 2015-02-09 NOTE — Telephone Encounter (Signed)
LMOM for pt to return call. 

## 2015-02-09 NOTE — Telephone Encounter (Signed)
Can we find out what dose she is on? Has she tried [place]?

## 2015-02-10 ENCOUNTER — Encounter: Payer: Self-pay | Admitting: Physician Assistant

## 2015-02-10 LAB — URINALYSIS, MICROSCOPIC ONLY
BACTERIA UA: NONE SEEN
Casts: NONE SEEN
Squamous Epithelial / LPF: NONE SEEN

## 2015-02-10 NOTE — Telephone Encounter (Signed)
Pt called back & has never tried [place].  Her ins expires today so she's concerned with cost.

## 2015-02-11 ENCOUNTER — Other Ambulatory Visit: Payer: Self-pay | Admitting: *Deleted

## 2015-02-11 LAB — URINE CULTURE
COLONY COUNT: NO GROWTH
Organism ID, Bacteria: NO GROWTH

## 2015-02-11 MED ORDER — PRAVASTATIN SODIUM 40 MG PO TABS: 40.0000 mg | ORAL_TABLET | Freq: Every day | ORAL | Status: DC

## 2015-02-11 MED ORDER — TRAZODONE HCL 50 MG PO TABS: 25.0000 mg | ORAL_TABLET | Freq: Every evening | ORAL | Status: DC | PRN

## 2015-02-11 NOTE — Progress Notes (Signed)
  Subjective:     Rebekah [name] is a 60 y.o. female and is here for a comprehensive physical exam. The patient reports no problems that she wants any treatment for today. her husband died in February 05, 2023 from infection complications. she is losing insurance and has a lot going on. she wants CPE before she loses insurance. Marland Kitchen  History   Social History  . Marital Status: Widowed    Spouse Name: N/A  . Number of Children: 2  . Years of Education: N/A   Occupational History  . Not on file.   Social History Main Topics  . Smoking status: Never Smoker   . Smokeless tobacco: Never Used  . Alcohol Use: 1.8 - 2.4 oz/week    3-4 Standard drinks or equivalent per week  . Drug Use: No  . Sexual Activity: No   Other Topics Concern  . Not on file   Social History Narrative   Health Maintenance  Topic Date Due  . COLONOSCOPY  04/11/2006  . INFLUENZA VACCINE  08/01/2015  . PAP SMEAR  01/17/2016  . MAMMOGRAM  04/09/2016  . TETANUS/TDAP  02/08/2025    The following portions of the patient's history were reviewed and updated as appropriate: allergies, current medications, past family history, past medical history, past social history, past surgical history and problem list.  Review of Systems A comprehensive review of systems was negative.   Objective:    BP 143/69 mmHg  Pulse 55  Ht 5' (1.524 m)  Wt 189 lb (85.73 kg)  BMI 36.91 kg/m2 General appearance: alert, cooperative, appears stated age and moderately obese Head: Normocephalic, without obvious abnormality, atraumatic Eyes: conjunctivae/corneas clear. PERRL, EOM's intact. Fundi benign. Ears: normal TM's and external ear canals both ears Nose: Nares normal. Septum midline. Mucosa normal. No drainage or sinus tenderness. Throat: lips, mucosa, and tongue normal; teeth and gums normal Neck: no adenopathy, no carotid bruit, no JVD, supple, symmetrical, trachea midline and thyroid not enlarged, symmetric, no  tenderness/mass/nodules Back: symmetric, no curvature. ROM normal. No CVA tenderness. Lungs: clear to auscultation bilaterally Heart: regular rate and rhythm, S1, S2 normal, no murmur, click, rub or gallop Abdomen: soft, non-tender; bowel sounds normal; no masses,  no organomegaly Extremities: extremities normal, atraumatic, no cyanosis or edema Pulses: 2+ and symmetric Skin: Skin color, texture, turgor normal. No rashes or lesions Lymph nodes: Cervical, supraclavicular, and axillary nodes normal. Neurologic: Grossly normal    Assessment:    Healthy female exam.      Plan:    CPE- labs ordered. Discussed need for weight loss. Exercise encouraged. Tdap given. Flu shot given. Pap scheduled with GYN and will manage mammogram. Needs colonoscopy but pt declines today.  See After Visit Summary for Counseling Recommendations

## 2015-02-11 NOTE — Telephone Encounter (Signed)
Trazodone would problem be most affordable have you tried this.

## 2015-02-11 NOTE — Progress Notes (Signed)
Pt has not tried trazodone.  Sent to [facility].

## 2015-02-13 ENCOUNTER — Other Ambulatory Visit: Payer: Self-pay | Admitting: Physician Assistant

## 2015-02-13 MED ORDER — PRAVASTATIN SODIUM 20 MG PO TABS: ORAL_TABLET | ORAL | Status: DC

## 2015-02-15 ENCOUNTER — Encounter: Payer: Self-pay | Admitting: Obstetrics and Gynecology

## 2015-02-15 LAB — IPS PAP TEST WITH HPV

## 2015-02-16 ENCOUNTER — Encounter: Payer: Self-pay | Admitting: Obstetrics and Gynecology

## 2015-03-10 ENCOUNTER — Other Ambulatory Visit: Payer: Self-pay | Admitting: Physician Assistant

## 2015-03-10 ENCOUNTER — Encounter: Payer: Self-pay | Admitting: Physician Assistant

## 2015-03-10 ENCOUNTER — Encounter: Payer: Self-pay | Admitting: Obstetrics and Gynecology

## 2015-03-10 MED ORDER — LOVASTATIN 40 MG PO TABS: 40.0000 mg | ORAL_TABLET | Freq: Every day | ORAL | Status: DC

## 2015-09-23 IMAGING — CT CT ABD-PEL WO/W CM
1 of 5 series · 9 of 32 positions shown, 15 images · IV contrast (omnipaque)
Comparison: None.

CLINICAL DATA: Right lower quadrant pain.  Trace hematuria.

EXAM:
CT ABDOMEN AND PELVIS WITHOUT AND WITH CONTRAST
TECHNIQUE: Multidetector CT imaging of the abdomen and pelvis was performed
following the standard protocol before and following the bolus
administration of intravenous contrast.
CONTRAST:  100mL OMNIPAQUE IOHEXOL 300 MG/ML  SOLN

[Series 2: stone · axial · 0.90mm/px · z∈[-445,-90]mm · 9 of 89 slices shown, 15 images]
[im 9/89  soft-tissue]
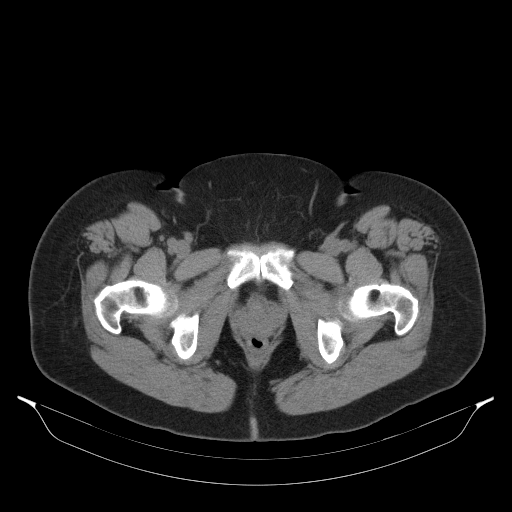
[im 9/89  bone]
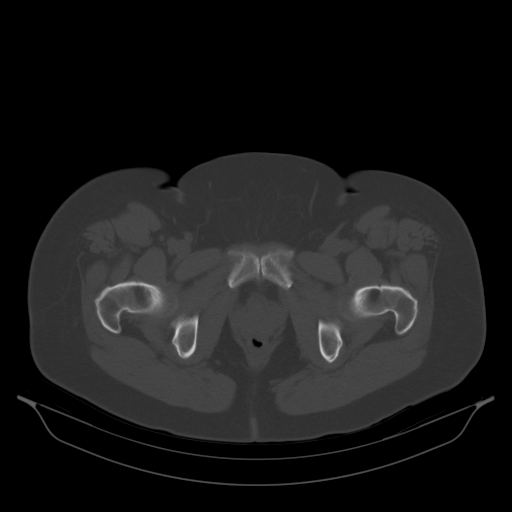
[im 18/89  soft-tissue]
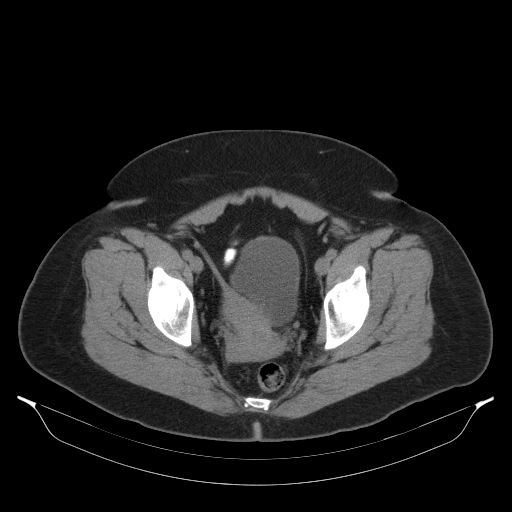
[im 27/89  soft-tissue]
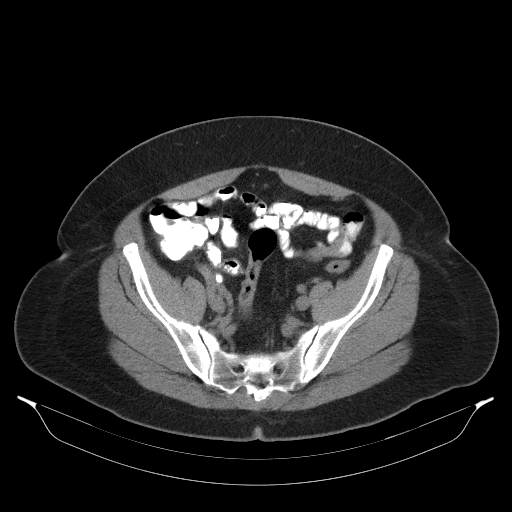
[im 36/89  soft-tissue]
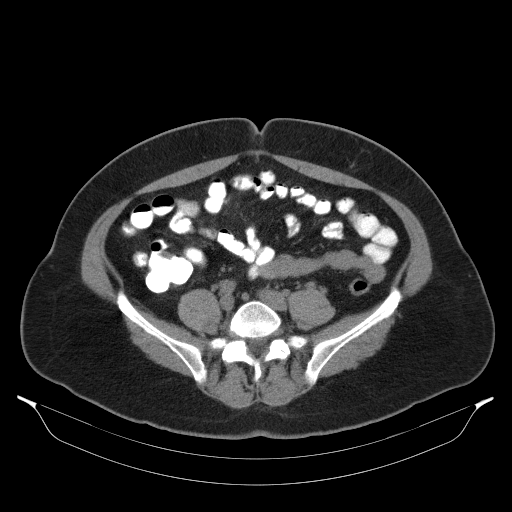
[im 45/89  soft-tissue]
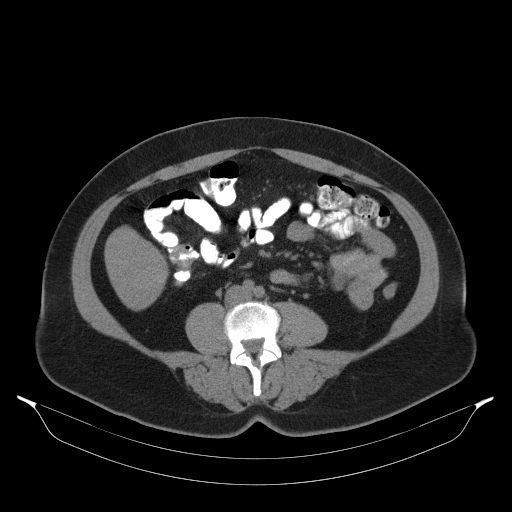
[im 53/89  soft-tissue]
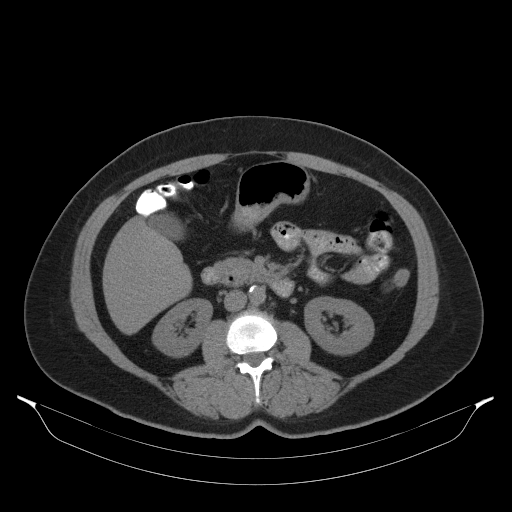
[im 53/89  lung]
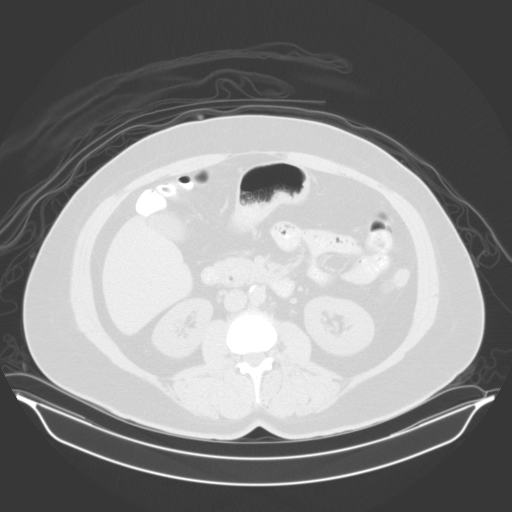
[im 62/89  soft-tissue]
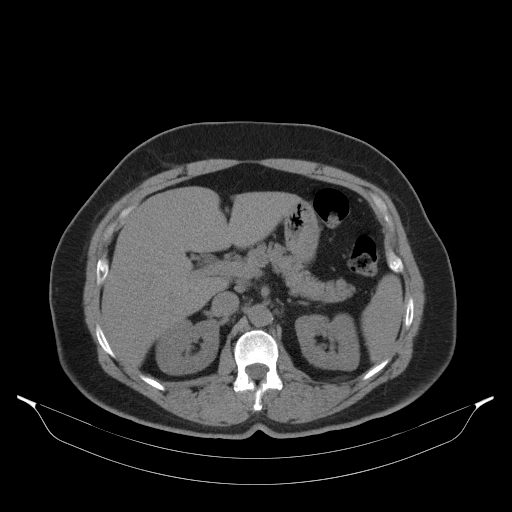
[im 62/89  lung]
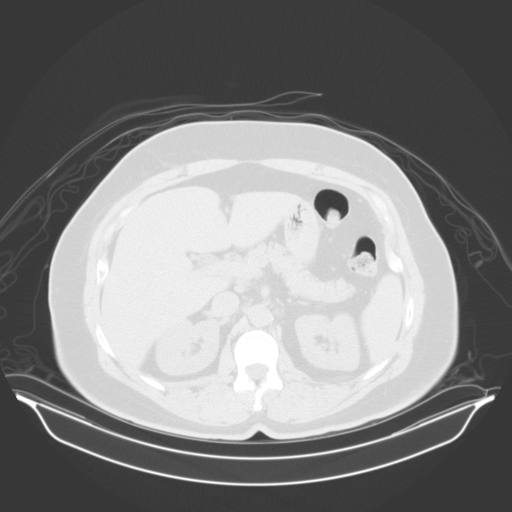
[im 71/89  soft-tissue]
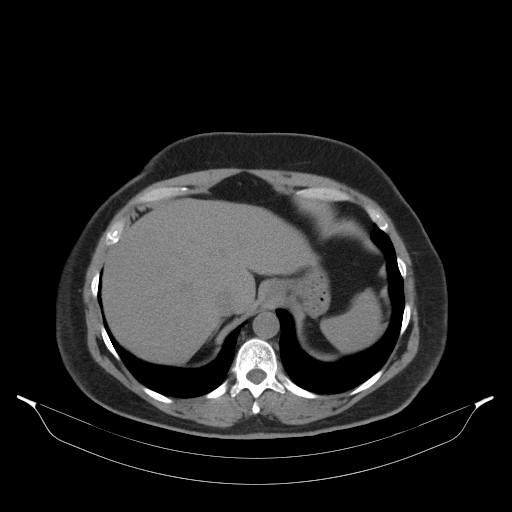
[im 71/89  lung]
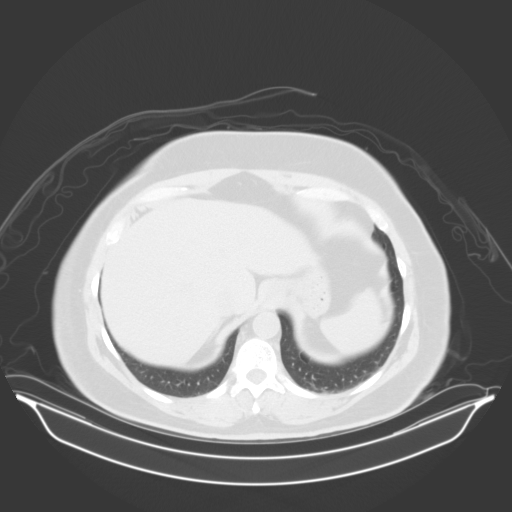
[im 80/89  soft-tissue]
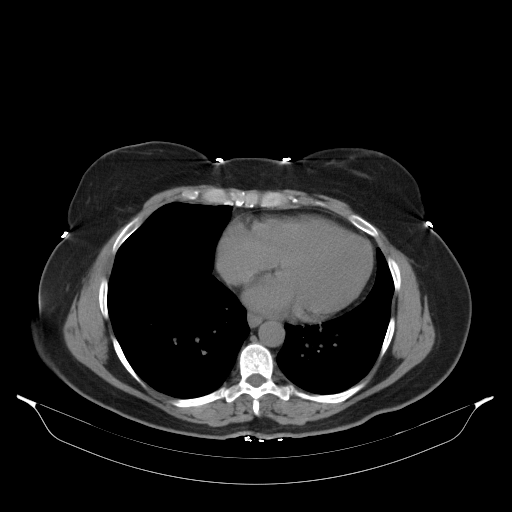
[im 80/89  lung]
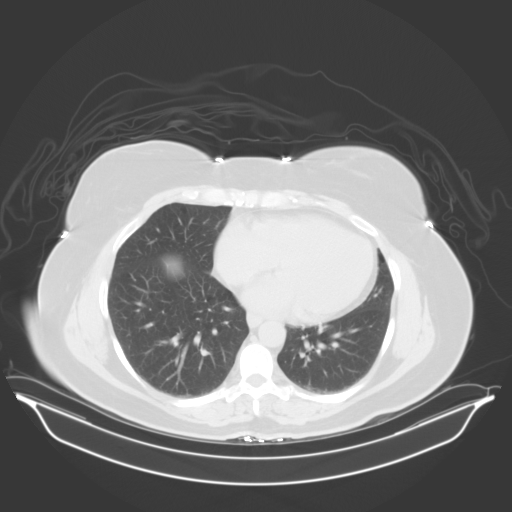
[im 80/89  bone]
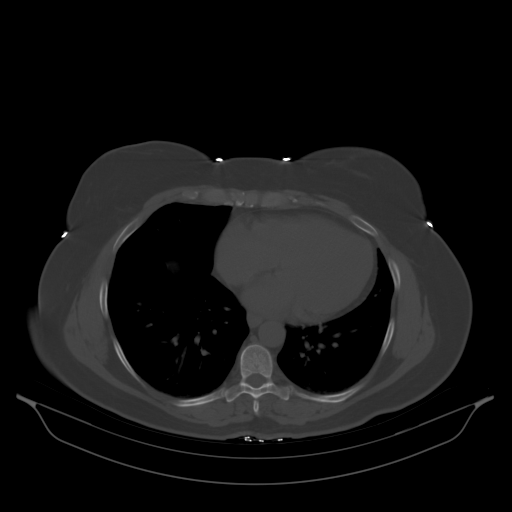

[9 of 32 positions shown; findings below may reference images not displayed]

FINDINGS: Lower chest: No evidnce for pulmonary nodule or mass. No focal
consolidation. No evidence for pleural effusion.

Hepatobiliary: No focal abnormality within the liver parenchyma. No
evidence for gallstones. No intra or extrahepatic biliary duct
dilatation.

Spleen: No splenomegaly. No focal mass lesion.

Pancreas: No focal mass lesion. No dilatation of the main duct. No
intraparenchymal cyst. No peripancreatic edema.

Stomach/Bowel: Nondistended. No gastric wall thickening. No evidence
of outlet obstruction. Duodenum is normally positioned as is the
ligament of [place]. Duodenum diverticulum noted. No small bowel wall
thickening. No small bowel dilatation. Terminal ileum and appendix
are normal. In diverticular change noted in the left colon without
diverticulitis.

Adrenals/urinary tract: No adrenal nodule or mass. No renal,
ureteral, or bladder stones. No enhancing lesion in either kidney.
No hydronephrosis.

Vascular/Lymphatic: Atherosclerotic calcification is noted in the
wall of the abdominal aorta without aneurysm. No pelvic sidewall
lymphadenopathy. No abdominal lymphadenopathy. Portal vein and
superior mesenteric vein are patent.

Reproductive: Uterus is unremarkable.  No adnexal mass.

Musculoskeletal: Bone windows reveal no worrisome lytic or sclerotic
osseous lesions. Gas in the subcutaneous fat of the left flank
region is presumably secondary to an injection site.

Other: No intraperitoneal free fluid.
IMPRESSION: Unremarkable CT evaluation of the abdomen and pelvis. Specifically
a, no findings to explain the patient's history of right lower
quadrant pain.

Left colonic diverticulosis without diverticulitis.

## 2016-01-06 ENCOUNTER — Other Ambulatory Visit: Payer: Self-pay

## 2016-01-06 ENCOUNTER — Encounter: Payer: Self-pay | Admitting: Physician Assistant

## 2016-01-06 MED ORDER — MONTELUKAST SODIUM 10 MG PO TABS: ORAL_TABLET | ORAL | Status: DC

## 2016-01-11 ENCOUNTER — Ambulatory Visit (INDEPENDENT_AMBULATORY_CARE_PROVIDER_SITE_OTHER): Payer: BLUE CROSS/BLUE SHIELD | Admitting: Physician Assistant

## 2016-01-11 ENCOUNTER — Encounter: Payer: Self-pay | Admitting: Physician Assistant

## 2016-01-11 VITALS — BP 132/61 | HR 61 | Ht 60.0 in | Wt 185.0 lb

## 2016-01-11 DIAGNOSIS — Z114 Encounter for screening for human immunodeficiency virus [HIV]: Secondary | ICD-10-CM

## 2016-01-11 DIAGNOSIS — Z1211 Encounter for screening for malignant neoplasm of colon: Secondary | ICD-10-CM

## 2016-01-11 DIAGNOSIS — Z23 Encounter for immunization: Secondary | ICD-10-CM | POA: Diagnosis not present

## 2016-01-11 DIAGNOSIS — J301 Allergic rhinitis due to pollen: Secondary | ICD-10-CM

## 2016-01-11 DIAGNOSIS — Z131 Encounter for screening for diabetes mellitus: Secondary | ICD-10-CM

## 2016-01-11 DIAGNOSIS — E669 Obesity, unspecified: Secondary | ICD-10-CM

## 2016-01-11 DIAGNOSIS — E6609 Other obesity due to excess calories: Secondary | ICD-10-CM | POA: Insufficient documentation

## 2016-01-11 DIAGNOSIS — Z1239 Encounter for other screening for malignant neoplasm of breast: Secondary | ICD-10-CM

## 2016-01-11 DIAGNOSIS — N3281 Overactive bladder: Secondary | ICD-10-CM

## 2016-01-11 DIAGNOSIS — Z1159 Encounter for screening for other viral diseases: Secondary | ICD-10-CM | POA: Diagnosis not present

## 2016-01-11 DIAGNOSIS — E785 Hyperlipidemia, unspecified: Secondary | ICD-10-CM | POA: Diagnosis not present

## 2016-01-11 LAB — COMPLETE METABOLIC PANEL WITH GFR
ALT: 19 U/L (ref 6–29)
AST: 18 U/L (ref 10–35)
Albumin: 4.2 g/dL (ref 3.6–5.1)
Alkaline Phosphatase: 59 U/L (ref 33–130)
BUN: 12 mg/dL (ref 7–25)
CHLORIDE: 106 mmol/L (ref 98–110)
CO2: 27 mmol/L (ref 20–31)
CREATININE: 0.66 mg/dL (ref 0.50–1.05)
Calcium: 9.1 mg/dL (ref 8.6–10.4)
GFR, Est African American: >89 mL/min (ref >=60)
GFR, Est Non African American: >89 mL/min (ref >=60)
Glucose, Bld: 83 mg/dL (ref 65–99)
Potassium: 4 mmol/L (ref 3.5–5.3)
Sodium: 142 mmol/L (ref 135–146)
Total Bilirubin: 0.4 mg/dL (ref 0.2–1.2)
Total Protein: 6.7 g/dL (ref 6.1–8.1)

## 2016-01-11 LAB — LIPID PANEL
Cholesterol: 158 mg/dL (ref 125–200)
HDL: 51 mg/dL (ref >=46)
LDL CALC: 90 mg/dL (ref <130)
Total CHOL/HDL Ratio: 3.1 Ratio (ref <=5.0)
Triglycerides: 86 mg/dL (ref <150)
VLDL: 17 mg/dL (ref <30)

## 2016-01-11 MED ORDER — LEVOCETIRIZINE DIHYDROCHLORIDE 5 MG PO TABS: ORAL_TABLET | ORAL | Status: DC

## 2016-01-11 MED ORDER — MONTELUKAST SODIUM 10 MG PO TABS: ORAL_TABLET | ORAL | Status: DC

## 2016-01-11 NOTE — Progress Notes (Signed)
   Subjective:    Patient ID: Rebekah [name], female    DOB: [birth year], 61 y.o.   MRN: [id number]  HPI Patient is a 61 year old female who presents to the clinic for 6 month follow-up on hyperlipidemia and she needs refills on medications.  Hyperlipidemia-patient started on lovastatin approximately 6 months ago. She admits she has not changed her diet and is not regularly exercising. We do not have a repeat lipid on her. She is tolerating the medication well.  Chronic allergic rhinitis-she does not need refills on Astelin she has plenty. She uses this regularly. She does need refills on Xyzal and Singulair. For the most part her allergies are under control.   Overactive bladder-patient is not currently taken near be checked. She just has stopped out of habit. She continues to have some increased urinary leakage and urgency. She plans to restart this medication.     Review of Systems  All other systems reviewed and are negative.      Objective:   Physical Exam  Constitutional: She is oriented to person, place, and time. She appears well-developed and well-nourished.  HENT:  Head: Normocephalic and atraumatic.  Eyes: Conjunctivae are normal. Right eye exhibits no discharge. Left eye exhibits no discharge.  Cardiovascular: Normal rate, regular rhythm and normal heart sounds.   Pulmonary/Chest: Effort normal and breath sounds normal.  Neurological: She is alert and oriented to person, place, and time.  Psychiatric: She has a normal mood and affect. Her behavior is normal.          Assessment & Plan:  Hyperlipidemia-we'll recheck lipid level today. Will refill medication accordingly. Encouraged low-fat diet and regular exercise.  Chronic allergic rhinitis-continue Astelin. Xyzal and Singulair or refill today and sent to mail order pharmacy.  Obesity- discussed weight loss diet and exercise. Discussed phentermine and other weight loss drugs. Pt would like to start her own exercise  and diet and then consider medications.   OAB-patient does not need refills to restart Mrybetriq. Follow-up in 2-3 months to make sure decrease in symptoms by at least 50%.  Mammogram was ordered today. Order was also sent for colonoscopy. Flu shot given today.   Fasting labs along with hep C and HIV screening ordered today.  Patient does need her annual women's exam as well as complete physical.

## 2016-01-12 LAB — HIV ANTIBODY (ROUTINE TESTING W REFLEX): HIV: NONREACTIVE

## 2016-01-12 LAB — HEPATITIS C ANTIBODY: HCV Ab: NEGATIVE

## 2016-01-13 MED ORDER — LOVASTATIN 40 MG PO TABS: 40.0000 mg | ORAL_TABLET | Freq: Every day | ORAL | Status: DC

## 2016-01-13 NOTE — Addendum Note (Signed)
Addended by: Donella Stade on: 01/13/2016 10:33 AM   Modules accepted: Orders

## 2016-02-10 ENCOUNTER — Telehealth: Payer: Self-pay | Admitting: Obstetrics and Gynecology

## 2016-02-10 NOTE — Telephone Encounter (Signed)
Spoke with patient. Advised per pap smear results from 02/09/2015 she will not need a pap smear this year. Advised she will still need to have a annual pelvic and breast exam. She is agreeable. Appointment is scheduled for her aex on 02/15/2016 at 11 am with Dr.Silva. She is agreeable to date and time.  Routing to provider for final review. Patient agreeable to disposition. Will close encounter.

## 2016-02-10 NOTE — Telephone Encounter (Signed)
Patient is not sure if she should have a pap smear this year. Does not remember what was discussed with Dr Quincy Simmonds last year.

## 2016-02-15 ENCOUNTER — Ambulatory Visit (INDEPENDENT_AMBULATORY_CARE_PROVIDER_SITE_OTHER): Payer: BLUE CROSS/BLUE SHIELD | Admitting: Obstetrics and Gynecology

## 2016-02-15 ENCOUNTER — Encounter: Payer: Self-pay | Admitting: Obstetrics and Gynecology

## 2016-02-15 VITALS — BP 138/82 | HR 56 | Resp 14 | Ht 60.75 in | Wt 185.0 lb

## 2016-02-15 DIAGNOSIS — R319 Hematuria, unspecified: Secondary | ICD-10-CM

## 2016-02-15 DIAGNOSIS — Z01419 Encounter for gynecological examination (general) (routine) without abnormal findings: Secondary | ICD-10-CM | POA: Diagnosis not present

## 2016-02-15 LAB — POCT URINALYSIS DIPSTICK
Bilirubin, UA: NEGATIVE
Glucose, UA: NEGATIVE
KETONES UA: NEGATIVE
Leukocytes, UA: NEGATIVE
Nitrite, UA: NEGATIVE
PH UA: 5
PROTEIN UA: NEGATIVE
Urobilinogen, UA: NEGATIVE

## 2016-02-15 NOTE — Patient Instructions (Addendum)

## 2016-02-15 NOTE — Progress Notes (Signed)
Patient ID: Rebekah [name], female   DOB: 1955/06/02, 61 y.o.   MRN: [id number] 61 y.o. [id number] Widowed Caucasian female here for annual exam.    Feeling a little down.  Husband is deceased.  Wants to restart Vit B12. Declines medication for depression.  Denies suicidal ideation.   Doing some home remodeling.  Son in Sports coach is a Chief Strategy Officer.   PCP:  Iran Planas, PA-C.  Labs with PCP.   No LMP recorded. Patient is postmenopausal.          Sexually active: No. female The current method of family planning is post menopausal status.    Exercising: No.   Smoker:  no  Health Maintenance: Pap:  02-09-15 Neg:Neg HR HPV History of abnormal Pap:  [id number] colposcopy/cryotherapy to cervix.  Paps normal since.  MMG:  04-09-14 3D/Density Cat.C/Neg/BiRads1:[facility]  Appointment 02-16-16. Colonoscopy:  01-25-16 tubular adenomatous with [facility] in [place] due 12/2020. BMD:   n/a  Result  n/a TDaP:  02-08-15 Screening Labs:  Hb today: PCP, Urine today: Trace RBCs--asymptomatic.  Does have some odor.   reports that she has never smoked. She has never used smokeless tobacco. She reports that she drinks alcohol. She reports that she does not use illicit drugs.  Past Medical History  Diagnosis Date  . Perimenopause   . Hyperlipidemia   . Allergy     --takes allergy injections    Past Surgical History  Procedure Laterality Date  . Breast surgery    . Knee surgery Bilateral   . Refractive surgery Bilateral     Current Outpatient Prescriptions  Medication Sig Dispense Refill  . azelastine (ASTELIN) 0.1 % nasal spray   5  . Beclomethasone Dipropionate 80 MCG/ACT AERS Place 2 sprays into the nose daily as needed. 26.1 g 1  . ibuprofen (ADVIL,MOTRIN) 800 MG tablet Take 1 tablet (800 mg total) by mouth every 8 (eight) hours as needed. 30 tablet 0  . levocetirizine (XYZAL) 5 MG tablet TAKE 1 TABLET (5 MG TOTAL) BY MOUTH EVERY EVENING. 90 tablet 4  . lovastatin (MEVACOR) 40  MG tablet Take 1 tablet (40 mg total) by mouth at bedtime. (Patient taking differently: Take 40 mg by mouth at bedtime. Takes 1/2 tablet daily) 90 tablet 4  . metroNIDAZOLE (METROGEL) 1 % gel Apply topically as needed.    . mirabegron ER (MYRBETRIQ) 50 MG TB24 tablet Take 1 tablet (50 mg total) by mouth daily. 90 tablet 1  . montelukast (SINGULAIR) 10 MG tablet TAKE 1 TABLET (10 MG TOTAL) BY MOUTH AT BEDTIME. 90 tablet 4  . Olopatadine HCl (PATADAY) 0.2 % SOLN Place 1 drop into both eyes 2 (two) times daily.    Marland Kitchen OVER THE COUNTER MEDICATION 2 (two) times daily. Zyflamend    . PROAIR RESPICLICK 123XX123 (90 BASE) MCG/ACT AEPB   3  . traZODone (DESYREL) 50 MG tablet Take 0.5-1 tablets (25-50 mg total) by mouth at bedtime as needed for sleep. 30 tablet 1  . UNABLE TO FIND Med Name: Allergy Shots     No current facility-administered medications for this visit.    Family History  Problem Relation Age of Onset  . Hypertension Mother   . Stroke Mother   . Cancer Mother 83    ovarian ca, hx of TTP  . Hyperlipidemia Mother   . Ovarian cancer Mother   . Hypertension Father   . Cancer Father 90    bladder ca  . Hyperlipidemia Father   . Anemia  Sister   . Diabetes Brother   . Mental illness Sister     mentally disabled--lives in group home in [place]  . Asthma Brother     allergy induced    ROS:  Pertinent items are noted in HPI.  Otherwise, a comprehensive ROS was negative.  Exam:   BP 138/82 mmHg  Pulse 56  Resp 14  Ht 5' 0.75" (1.543 m)  Wt 185 lb (83.915 kg)  BMI 35.25 kg/m2    General appearance: alert, cooperative and appears stated age Head: Normocephalic, without obvious abnormality, atraumatic Neck: no adenopathy, supple, symmetrical, trachea midline and thyroid normal to inspection and palpation Lungs: clear to auscultation bilaterally Breasts: normal appearance, no masses or tenderness, Inspection negative, No nipple retraction or dimpling, No nipple discharge or bleeding,  No axillary or supraclavicular adenopathy Heart: regular rate and rhythm Abdomen: soft, non-tender; bowel sounds normal; no masses,  no organomegaly Extremities: extremities normal, atraumatic, no cyanosis or edema Skin: Skin color, texture, turgor normal. No rashes or lesions Lymph nodes: Cervical, supraclavicular, and axillary nodes normal. No abnormal inguinal nodes palpated Neurologic: Grossly normal  Pelvic: External genitalia:  no lesions              Urethra:  normal appearing urethra with no masses, tenderness or lesions              Bartholins and Skenes: normal                 Vagina: normal appearing vagina with normal color and discharge, no lesions              Cervix: no lesions              Pap taken: No. Bimanual Exam:  Uterus:  normal size, contour, position, consistency, mobility, non-tender              Adnexa: normal adnexa and no mass, fullness, tenderness              Rectovaginal: Yes.  .  Confirms.              Anus:  normal sphincter tone, no lesions  Chaperone was present for exam.  Assessment:   Well woman visit with normal exam. Urine odor and microscopic hematuria.  Bereavement.   Plan: Yearly mammogram recommended after age 78.  Recommended self breast exam.  Pap and HR HPV as above. Discussed Calcium, Vitamin D, regular exercise program including cardiovascular and weight bearing exercise. I encouraged a regular exercise program and increasing her social circle. Labs performed.   Urine micro and culture.  Refills given on medications.  No..   Consider St. Jon's wort.  Support given for loss of husband.  Referred to [facility].  Patient will call  Follow up annually and prn.     After visit summary provided.

## 2016-02-16 ENCOUNTER — Ambulatory Visit (INDEPENDENT_AMBULATORY_CARE_PROVIDER_SITE_OTHER): Payer: BLUE CROSS/BLUE SHIELD

## 2016-02-16 DIAGNOSIS — Z1231 Encounter for screening mammogram for malignant neoplasm of breast: Secondary | ICD-10-CM

## 2016-02-16 DIAGNOSIS — Z1239 Encounter for other screening for malignant neoplasm of breast: Secondary | ICD-10-CM

## 2016-02-16 LAB — URINALYSIS, MICROSCOPIC ONLY
BACTERIA UA: NONE SEEN [HPF]
CASTS: NONE SEEN [LPF]
CRYSTALS: NONE SEEN [HPF]
Squamous Epithelial / LPF: NONE SEEN [HPF] (ref <=5)
WBC, UA: NONE SEEN WBC/HPF (ref <=5)
Yeast: NONE SEEN [HPF]

## 2016-02-16 LAB — URINE CULTURE
Colony Count: NO GROWTH
Organism ID, Bacteria: NO GROWTH

## 2016-02-17 ENCOUNTER — Ambulatory Visit (INDEPENDENT_AMBULATORY_CARE_PROVIDER_SITE_OTHER): Payer: BLUE CROSS/BLUE SHIELD | Admitting: Physician Assistant

## 2016-02-17 ENCOUNTER — Encounter: Payer: Self-pay | Admitting: Physician Assistant

## 2016-02-17 VITALS — BP 143/67 | HR 81 | Ht 60.0 in | Wt 186.0 lb

## 2016-02-17 DIAGNOSIS — Z20818 Contact with and (suspected) exposure to other bacterial communicable diseases: Secondary | ICD-10-CM

## 2016-02-17 DIAGNOSIS — Z2089 Contact with and (suspected) exposure to other communicable diseases: Secondary | ICD-10-CM

## 2016-02-17 DIAGNOSIS — J0100 Acute maxillary sinusitis, unspecified: Secondary | ICD-10-CM | POA: Diagnosis not present

## 2016-02-17 DIAGNOSIS — R05 Cough: Secondary | ICD-10-CM | POA: Diagnosis not present

## 2016-02-17 DIAGNOSIS — R059 Cough, unspecified: Secondary | ICD-10-CM

## 2016-02-17 LAB — POCT RAPID STREP A (OFFICE): RAPID STREP A SCREEN: NEGATIVE

## 2016-02-17 MED ORDER — BENZONATATE 200 MG PO CAPS: 200.0000 mg | ORAL_CAPSULE | Freq: Two times a day (BID) | ORAL | Status: DC | PRN

## 2016-02-17 MED ORDER — AMOXICILLIN-POT CLAVULANATE 875-125 MG PO TABS: 1.0000 | ORAL_TABLET | Freq: Two times a day (BID) | ORAL | Status: DC

## 2016-02-17 MED ORDER — METHYLPREDNISOLONE SODIUM SUCC 125 MG IJ SOLR
125.0000 mg | Freq: Once | INTRAMUSCULAR | Status: AC
  Administered 2016-02-17: 125 mg via INTRAMUSCULAR

## 2016-02-17 MED ORDER — HYDROCOD POLST-CPM POLST ER 10-8 MG/5ML PO SUER: 5.0000 mL | Freq: Two times a day (BID) | ORAL | Status: DC | PRN

## 2016-02-17 NOTE — Progress Notes (Addendum)
   Subjective:    Patient ID: Rebekah [name], female    DOB: [birth year], 61 y.o.   MRN: [id number]  HPI  Patient is a 60 year old female that presents with non-productive cough since yesterday. Patient states that she has also experienced congestion, rhinorrhea with trace blood and headache. She does ave an ongoing history of allergies. She gets regular allergy shots monthly.Patient denies nausea, vomiting, diarrhea, constipation, abdominal pain and shortness of breath. Patient has been afebrile without chills. Patient has taken alka seltzer, robitussin and Mucinex, which has not relieved her symptoms.   Review of Systems Please see HPI.     Objective:   Physical Exam  Constitutional: She is oriented to person, place, and time. She appears well-developed and well-nourished.  HENT:  Head: Normocephalic and atraumatic.  Nose: Nose normal.  Mouth/Throat: Oropharynx is clear and moist.  Patient's uvula is erythematous with ulceration visualized. Patient's posterior pharynx is erythematous without tonsillar exudate or petechia.   Patient's nasal turbinates are very edematous with copious rhinorrhea.   Patient has maxillary and sinus tenderness.   Patient's TMs are pearly with bony landmarks visualized.   Eyes: Conjunctivae and EOM are normal. Pupils are equal, round, and reactive to light. Right eye exhibits no discharge. Left eye exhibits no discharge.  Neck: Normal range of motion. Neck supple. No thyromegaly present.  Cardiovascular: Normal rate, regular rhythm, normal heart sounds and intact distal pulses.   No murmur heard. Pulmonary/Chest: Effort normal and breath sounds normal. No respiratory distress. She has no wheezes. She has no rales. She exhibits no tenderness.  Abdominal: Soft. Bowel sounds are normal. She exhibits no distension. There is no tenderness. There is no rebound and no guarding.  Musculoskeletal: Normal range of motion. She exhibits no edema.  Lymphadenopathy:   She has no cervical adenopathy.  Neurological: She is alert and oriented to person, place, and time.  Skin: Skin is warm and dry.  Psychiatric: She has a normal mood and affect. Her behavior is normal. Judgment and thought content normal.      Assessment & Plan:   1. Acute Sinusitis  Differential diagnosis includes streptococcal pharyngitis. Patient's in office rapid strep test was negative. Patient experienced profound congestion, rhinorrhea, and cough after an upper respiratory tract infection last week, making secondary bacterial infection of the sinuses likely. Certainly allergies did exacerbate problem. Patient was prescribed Augmentin for 10 days. Patient was also given an injfection of solumedrol in the office.   2. Cough Patient states that dry cough keeps her from resting at night. Patient was prescribed Tussionex to manage cough symptoms at night and tessalon capsules during the day.

## 2016-02-17 NOTE — Patient Instructions (Signed)

## 2016-02-20 MED ORDER — AZITHROMYCIN 250 MG PO TABS: ORAL_TABLET | ORAL | Status: DC

## 2016-02-21 ENCOUNTER — Other Ambulatory Visit: Payer: Self-pay | Admitting: *Deleted

## 2016-04-17 DIAGNOSIS — J301 Allergic rhinitis due to pollen: Secondary | ICD-10-CM | POA: Diagnosis not present

## 2016-04-17 DIAGNOSIS — J3089 Other allergic rhinitis: Secondary | ICD-10-CM | POA: Diagnosis not present

## 2016-05-08 DIAGNOSIS — J3089 Other allergic rhinitis: Secondary | ICD-10-CM | POA: Diagnosis not present

## 2016-05-08 DIAGNOSIS — J301 Allergic rhinitis due to pollen: Secondary | ICD-10-CM | POA: Diagnosis not present

## 2016-05-30 DIAGNOSIS — J3089 Other allergic rhinitis: Secondary | ICD-10-CM | POA: Diagnosis not present

## 2016-05-30 DIAGNOSIS — J301 Allergic rhinitis due to pollen: Secondary | ICD-10-CM | POA: Diagnosis not present

## 2016-06-11 ENCOUNTER — Ambulatory Visit (INDEPENDENT_AMBULATORY_CARE_PROVIDER_SITE_OTHER): Payer: BLUE CROSS/BLUE SHIELD | Admitting: Physician Assistant

## 2016-06-11 ENCOUNTER — Encounter: Payer: Self-pay | Admitting: Physician Assistant

## 2016-06-11 ENCOUNTER — Telehealth: Payer: Self-pay | Admitting: *Deleted

## 2016-06-11 VITALS — BP 134/51 | HR 53 | Ht 60.0 in | Wt 181.0 lb

## 2016-06-11 DIAGNOSIS — Z79899 Other long term (current) drug therapy: Secondary | ICD-10-CM

## 2016-06-11 DIAGNOSIS — R8290 Unspecified abnormal findings in urine: Secondary | ICD-10-CM

## 2016-06-11 DIAGNOSIS — R319 Hematuria, unspecified: Secondary | ICD-10-CM

## 2016-06-11 DIAGNOSIS — E785 Hyperlipidemia, unspecified: Secondary | ICD-10-CM

## 2016-06-11 DIAGNOSIS — E669 Obesity, unspecified: Secondary | ICD-10-CM | POA: Diagnosis not present

## 2016-06-11 DIAGNOSIS — J309 Allergic rhinitis, unspecified: Secondary | ICD-10-CM

## 2016-06-11 DIAGNOSIS — Z0000 Encounter for general adult medical examination without abnormal findings: Secondary | ICD-10-CM

## 2016-06-11 LAB — POCT URINALYSIS DIPSTICK
Bilirubin, UA: NEGATIVE
GLUCOSE UA: NEGATIVE
Ketones, UA: NEGATIVE
LEUKOCYTES UA: NEGATIVE
NITRITE UA: NEGATIVE
Protein, UA: NEGATIVE
Spec Grav, UA: >=1.03
UROBILINOGEN UA: 0.2
pH, UA: 5

## 2016-06-11 LAB — LIPID PANEL
CHOLESTEROL: 175 mg/dL (ref 125–200)
HDL: 62 mg/dL (ref >=46)
LDL Cholesterol: 98 mg/dL (ref <130)
Total CHOL/HDL Ratio: 2.8 Ratio (ref <=5.0)
Triglycerides: 76 mg/dL (ref <150)
VLDL: 15 mg/dL (ref <30)

## 2016-06-11 MED ORDER — LORCASERIN HCL 10 MG PO TABS: ORAL_TABLET | ORAL | Status: DC

## 2016-06-11 NOTE — Patient Instructions (Signed)

## 2016-06-11 NOTE — Addendum Note (Signed)
Addended by: Beatris Ship L on: 06/11/2016 09:05 AM   Modules accepted: Orders

## 2016-06-11 NOTE — Progress Notes (Signed)
Subjective:     Rebekah [name] is a 61 y.o. female and is here for a comprehensive physical exam. The patient reports she did cut statin in half for the last 6 months and wants level checked today. she has been working on diet and exercise as well. she has lost 5lbs but would really like to lose more. not tried any medications. .  Social History   Social History  . Marital Status: Widowed    Spouse Name: N/A  . Number of Children: 2  . Years of Education: N/A   Occupational History  . Not on file.   Social History Main Topics  . Smoking status: Never Smoker   . Smokeless tobacco: Never Used  . Alcohol Use: 0.0 oz/week    0 Standard drinks or equivalent per week     Comment: 1-2 weekly  . Drug Use: No  . Sexual Activity: No   Other Topics Concern  . Not on file   Social History Narrative   Health Maintenance  Topic Date Due  . INFLUENZA VACCINE  07/31/2016  . PAP SMEAR  02/09/2018  . MAMMOGRAM  02/15/2018  . TETANUS/TDAP  02/08/2025  . COLONOSCOPY  01/24/2026  . ZOSTAVAX  Addressed  . Hepatitis C Screening  Completed  . HIV Screening  Completed    The following portions of the patient's history were reviewed and updated as appropriate: allergies, current medications, past family history, past medical history, past social history, past surgical history and problem list.  Review of Systems Pertinent items noted in HPI and remainder of comprehensive ROS otherwise negative.   Objective:    BP 134/51 mmHg  Pulse 53  Ht 5' (1.524 m)  Wt 181 lb (82.101 kg)  BMI 35.35 kg/m2 General appearance: alert, cooperative and appears stated age Head: Normocephalic, without obvious abnormality, atraumatic Eyes: conjunctivae/corneas clear. PERRL, EOM's intact. Fundi benign. Ears: normal TM's and external ear canals both ears Nose: Nares normal. Septum midline. Mucosa normal. No drainage or sinus tenderness., turbinates red, swollen, grade 3 out of 4 this is a chronic condition.   Throat: lips, mucosa, and tongue normal; teeth and gums normal Neck: no adenopathy, no carotid bruit, no JVD, supple, symmetrical, trachea midline and thyroid not enlarged, symmetric, no tenderness/mass/nodules Back: symmetric, no curvature. ROM normal. No CVA tenderness. Lungs: clear to auscultation bilaterally Heart: regular rate and rhythm, S1, S2 normal, no murmur, click, rub or gallop Abdomen: soft, non-tender; bowel sounds normal; no masses,  no organomegaly Extremities: extremities normal, atraumatic, no cyanosis or edema Pulses: 2+ and symmetric Skin: Skin color, texture, turgor normal. No rashes or lesions Lymph nodes: Cervical, supraclavicular, and axillary nodes normal. Neurologic: Alert and oriented X 3, normal strength and tone. Normal symmetric reflexes. Normal coordination and gait    Assessment:    Healthy female exam.      Plan:    CPE- immunizations up to date. Pap smear up to date. Discussed ASA 81mg  daily. Encouraged calcium 1500mg  and vitamin d [phone] units daily.   Hyperlipidemia/medication management- cuff statin in half. Wants to see if can come off completely. Recheck lipid.   Obesity- discussed medication options. Concern with sleeping difficultly/sleep apnea to start phentermine. Will start belviq. MOA's and SE's discussed. Follow up in 3 months. Continue with diet and exercise.   Allergic rhinitis-  Chronic condition. On patanol, xyzal, singulair and qnasl. She admits not using nasal spray regularly.   OAB- controlled. Does not need refill.  See After Visit Summary for  Counseling Recommendations

## 2016-06-11 NOTE — Telephone Encounter (Signed)
PA submitted through covermymeds for Belviq Key: [id number]

## 2016-06-13 LAB — URINE CULTURE
COLONY COUNT: NO GROWTH
ORGANISM ID, BACTERIA: NO GROWTH

## 2016-06-14 NOTE — Telephone Encounter (Signed)
PLEASE ROUT THIS BACK TO AMBER AS I AM LEAVING AT 12 AND WILL NOT BE HERE FRIDAY:Approved through insurance; patient wants to know if she can take just one pill of the Belviq in the am instead of twice a day. She states she could immediately tell a difference after taking the medication once. If she just takes it once a day she can already have a two month supply which in the long run would be cheaper for her. With the coupon card it cost her  $82. I did tell her it may not be as effective but it probably would not hurt her. She wanted to confirm with Luvenia Starch before starting to take the medicine in this way. Pt is aware that she may not receive a call until tomorrow as Luvenia Starch is out of the office today

## 2016-06-15 NOTE — Telephone Encounter (Signed)
Call pt: obviously it is not indicated to once a day. So efficacy could not be as good. But it will not hurt you.

## 2016-06-20 DIAGNOSIS — J3089 Other allergic rhinitis: Secondary | ICD-10-CM | POA: Diagnosis not present

## 2016-06-20 DIAGNOSIS — J301 Allergic rhinitis due to pollen: Secondary | ICD-10-CM | POA: Diagnosis not present

## 2016-06-20 DIAGNOSIS — R05 Cough: Secondary | ICD-10-CM | POA: Diagnosis not present

## 2016-06-20 NOTE — Telephone Encounter (Signed)
Pt.notified

## 2016-07-09 DIAGNOSIS — J3089 Other allergic rhinitis: Secondary | ICD-10-CM | POA: Diagnosis not present

## 2016-07-09 DIAGNOSIS — J301 Allergic rhinitis due to pollen: Secondary | ICD-10-CM | POA: Diagnosis not present

## 2016-07-19 DIAGNOSIS — J301 Allergic rhinitis due to pollen: Secondary | ICD-10-CM | POA: Diagnosis not present

## 2016-07-19 DIAGNOSIS — J3089 Other allergic rhinitis: Secondary | ICD-10-CM | POA: Diagnosis not present

## 2016-08-02 DIAGNOSIS — J3089 Other allergic rhinitis: Secondary | ICD-10-CM | POA: Diagnosis not present

## 2016-08-02 DIAGNOSIS — J301 Allergic rhinitis due to pollen: Secondary | ICD-10-CM | POA: Diagnosis not present

## 2016-08-15 DIAGNOSIS — J301 Allergic rhinitis due to pollen: Secondary | ICD-10-CM | POA: Diagnosis not present

## 2016-08-15 DIAGNOSIS — J3089 Other allergic rhinitis: Secondary | ICD-10-CM | POA: Diagnosis not present

## 2016-08-28 ENCOUNTER — Encounter: Payer: Self-pay | Admitting: Family Medicine

## 2016-08-28 ENCOUNTER — Ambulatory Visit (INDEPENDENT_AMBULATORY_CARE_PROVIDER_SITE_OTHER): Payer: BLUE CROSS/BLUE SHIELD | Admitting: Family Medicine

## 2016-08-28 VITALS — BP 138/85 | HR 65 | Temp 98.7°F | Resp 18 | Wt 184.0 lb

## 2016-08-28 DIAGNOSIS — Z23 Encounter for immunization: Secondary | ICD-10-CM | POA: Diagnosis not present

## 2016-08-28 DIAGNOSIS — S338XXA Sprain of other parts of lumbar spine and pelvis, initial encounter: Secondary | ICD-10-CM | POA: Diagnosis not present

## 2016-08-28 DIAGNOSIS — S39012A Strain of muscle, fascia and tendon of lower back, initial encounter: Secondary | ICD-10-CM

## 2016-08-28 NOTE — Patient Instructions (Signed)
Thank you for coming in today. Come back or go to the emergency room if you notice new weakness new numbness problems walking or bowel or bladder problems. Attend PT.    Lumbosacral Strain Lumbosacral strain is a strain of any of the parts that make up your lumbosacral vertebrae. Your lumbosacral vertebrae are the bones that make up the lower third of your backbone. Your lumbosacral vertebrae are held together by muscles and tough, fibrous tissue (ligaments).  CAUSES  A sudden blow to your back can cause lumbosacral strain. Also, anything that causes an excessive stretch of the muscles in the low back can cause this strain. This is typically seen when people exert themselves strenuously, fall, lift heavy objects, bend, or crouch repeatedly. RISK FACTORS  Physically demanding work.  Participation in pushing or pulling sports or sports that require a sudden twist of the back (tennis, golf, baseball).  Weight lifting.  Excessive lower back curvature.  Forward-tilted pelvis.  Weak back or abdominal muscles or both.  Tight hamstrings. SIGNS AND SYMPTOMS  Lumbosacral strain may cause pain in the area of your injury or pain that moves (radiates) down your leg.  DIAGNOSIS Your health care provider can often diagnose lumbosacral strain through a physical exam. In some cases, you may need tests such as X-ray exams.  TREATMENT  Treatment for your lower back injury depends on many factors that your clinician will have to evaluate. However, most treatment will include the use of anti-inflammatory medicines. HOME CARE INSTRUCTIONS   Avoid hard physical activities (tennis, racquetball, waterskiing) if you are not in proper physical condition for it. This may aggravate or create problems.  If you have a back problem, avoid sports requiring sudden body movements. Swimming and walking are generally safer activities.  Maintain good posture.  Maintain a healthy weight.  For acute conditions, you  may put ice on the injured area.  Put ice in a plastic bag.  Place a towel between your skin and the bag.  Leave the ice on for 20 minutes, 2-3 times a day.  When the low back starts healing, stretching and strengthening exercises may be recommended. SEEK MEDICAL CARE IF:  Your back pain is getting worse.  You experience severe back pain not relieved with medicines. SEEK IMMEDIATE MEDICAL CARE IF:   You have numbness, tingling, weakness, or problems with the use of your arms or legs.  There is a change in bowel or bladder control.  You have increasing pain in any area of the body, including your belly (abdomen).  You notice shortness of breath, dizziness, or feel faint.  You feel sick to your stomach (nauseous), are throwing up (vomiting), or become sweaty.  You notice discoloration of your toes or legs, or your feet get very cold. MAKE SURE YOU:   Understand these instructions.  Will watch your condition.  Will get help right away if you are not doing well or get worse.   This information is not intended to replace advice given to you by your health care provider. Make sure you discuss any questions you have with your health care provider.   Document Released: 09/26/2005 Document Revised: 01/07/2015 Document Reviewed: 08/05/2013 Elsevier Interactive Patient Education [employer].
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
## 2016-08-29 DIAGNOSIS — J3089 Other allergic rhinitis: Secondary | ICD-10-CM | POA: Diagnosis not present

## 2016-08-29 DIAGNOSIS — J301 Allergic rhinitis due to pollen: Secondary | ICD-10-CM | POA: Diagnosis not present

## 2016-08-31 ENCOUNTER — Ambulatory Visit: Payer: BLUE CROSS/BLUE SHIELD | Attending: Family Medicine

## 2016-08-31 ENCOUNTER — Ambulatory Visit: Payer: BLUE CROSS/BLUE SHIELD

## 2016-08-31 DIAGNOSIS — M545 Low back pain, unspecified: Secondary | ICD-10-CM

## 2016-08-31 DIAGNOSIS — R252 Cramp and spasm: Secondary | ICD-10-CM

## 2016-08-31 DIAGNOSIS — M542 Cervicalgia: Secondary | ICD-10-CM

## 2016-08-31 DIAGNOSIS — R293 Abnormal posture: Secondary | ICD-10-CM | POA: Diagnosis not present

## 2016-08-31 NOTE — Patient Instructions (Signed)
Issued from cabinet posture exercise and Neck ROM not extension and pelvic tilt and Knee to chest

## 2016-08-31 NOTE — Therapy (Signed)
[place], [place], [address] Phone: [phone]   Fax:  [phone]  Physical Therapy Evaluation  Patient Details  Name: Rebekah [name] MRN: [id number] Date of Birth: January 23, 1955 Referring Provider: Lynne Leader MD  Encounter Date: 08/31/2016      PT End of Session - 08/31/16 1034    Visit Number 1   Number of Visits 12   Date for PT Re-Evaluation 10/12/16   PT Start Time 0935   PT Stop Time 1025   PT Time Calculation (min) 50 min   Activity Tolerance Patient tolerated treatment well   Behavior During Therapy [facility] for tasks assessed/performed      Past Medical History:  Diagnosis Date  . Allergy    --takes allergy injections  . Hyperlipidemia   . Perimenopause     Past Surgical History:  Procedure Laterality Date  . BREAST SURGERY    . KNEE SURGERY Bilateral   . REFRACTIVE SURGERY Bilateral     There were no vitals filed for this visit.       Subjective Assessment - 08/31/16 0946    Multiple Pain Sites Yes   Pain Score 3   Pain Location Neck   Pain Orientation Right;Left   Pain Descriptors / Indicators Aching;Sore   Pain Type Acute pain   Pain Onset 1 to 4 weeks ago   Pain Frequency Constant   Aggravating Factors  Not sure    Pain Relieving Factors meds   Pain Score 2   Pain Location Back   Pain Orientation Posterior;Right;Left   Pain Descriptors / Indicators Aching;Shooting;Sore;Dull   Pain Type Acute pain   Pain Radiating Towards reports some parasthesias  and tingling   Pain Onset In the past 7 days   Pain Frequency Constant   Aggravating Factors  Not sure    Pain Relieving Factors meds            [facility] PT Assessment - 08/31/16 0935      Assessment   Medical Diagnosis Lumbosacral strain   Referring Provider Lynne Leader MD   Next MD Visit as needed   Prior Therapy No     Precautions   Precautions None;Other (comment)   Precaution Comments No strenuous exercise      Restrictions   Weight Bearing Restrictions No     Balance Screen   Has the patient fallen in the past 6 months No   Has the patient had a decrease in activity level because of a fear of falling?  No   Is the patient reluctant to leave their home because of a fear of falling?  No     Prior Function   Level of Independence Independent   Vocation Full time employment   Vocation Requirements sitting work, Journalist, newspaper   Leisure play with grand kids and , refinish furniture and creafts     Cognition   Overall Cognitive Status Within Functional Limits for tasks assessed     Observation/Other Assessments   Focus on Therapeutic Outcomes (FOTO)  37% limited     ROM / Strength   AROM / PROM / Strength AROM;Strength     AROM   AROM Assessment Site Cervical;Lumbar   Cervical Flexion 60   Cervical Extension 60   Cervical - Right Side Bend 40   Cervical - Left Side Bend 38   Cervical - Right Rotation 72   Cervical - Left Rotation 70   Lumbar Flexion WFL   Lumbar  Extension WNL   Lumbar - Right Side Bend WFL   Lumbar - Left Side [facility]     Strength   Overall Strength Comments WNL both UE and LE     Palpation   Palpation comment soft tissue tnederness lower lumbar and neck to traps and suboccipital areas RT and LT     Ambulation/Gait   Gait Comments WNL                   [facility] Adult PT Treatment/Exercise - 08/31/16 0935      Exercises   Exercises Lumbar;Neck     Modalities   Modalities Cryotherapy     Cryotherapy   Number Minutes Cryotherapy 12 Minutes   Cryotherapy Location Cervical   Type of Cryotherapy --  cold pack     Manual Therapy   Manual Therapy Joint mobilization;Soft tissue mobilization;Manual Traction   Joint Mobilization Gr 2-3 to C1  PA   Soft tissue mobilization mostly to sub occiptital area   Manual Traction cervical x 10                  PT Short Term Goals - 08/31/16 1041      PT SHORT TERM GOAL #1   Title She will be  indendent with inital HEP   Time 3   Period Weeks   Status New     PT SHORT TERM GOAL #2   Title She will report headaches decreased 50% or more   Time 3   Period Weeks   Status New     PT SHORT TERM GOAL #3   Title LBP decreased 50% or more    Time 3   Period Weeks   Status New           PT Long Term Goals - 08/31/16 1042      PT LONG TERM GOAL #1   Title She will demo awarenesss of good posture   Time 3   Period Weeks   Status New     PT LONG TERM GOAL #2   Title She will be independent with all HEP issued   Time 6   Period Weeks   Status New     PT LONG TERM GOAL #3   Title She will report pain decreased 80% or more and be intermittant   Time 6   Period Weeks     PT LONG TERM GOAL #4   Title headaches will be eliminated    Time 6   Period Weeks   Status New     PT LONG TERM GOAL #5   Title neck pain decreased 80% and intermittant   Time 6   Period Weeks   Status New     Additional Long Term Goals   Additional Long Term Goals Yes     PT LONG TERM GOAL #6   Title She will perform all normal home and work activity with normal pace and frequency   Time 6   Period Weeks   Status New               Plan - 08/31/16 1036    Clinical Impression Statement Ms Pruneau presents for moderate complexity eval with back and neck pain and associated headaches. She is tender in soft tissues of neck/shoulders and lower back.  Mild forward head posture. limiting tolerance to activity .    Rehab Potential Good   PT Frequency 2x / week   PT Duration 6  weeks   PT Treatment/Interventions Cryotherapy;Electrical Stimulation;Iontophoresis 4mg /ml Dexamethasone;Moist Heat;Traction;Ultrasound;Passive range of motion;Patient/family education;Manual techniques;Taping;Dry needling;Therapeutic exercise   PT Next Visit Plan MAnual to back and neck , add to HEP , modalities as needed.    PT Home Exercise Plan posture and stretching neck and back   Consulted and Agree with  Plan of Care Patient      Patient will benefit from skilled therapeutic intervention in order to improve the following deficits and impairments:  Decreased activity tolerance, Pain, Postural dysfunction, Increased muscle spasms  Visit Diagnosis: Bilateral low back pain without sciatica  Abnormal posture  Cramp and spasm  Cervicalgia     Problem List Patient Active Problem List   Diagnosis Date Noted  . Rhinitis, allergic 06/11/2016  . Obesity 01/11/2016  . Squamous cell carcinoma ([place]) 09/21/2014  . Chronic allergic rhinitis 05/30/2014  . Elevated LDL cholesterol level 04/14/2014  . Allergic rhinitis 04/14/2014  . Seasonal allergies 04/14/2014  . OAB (overactive bladder) 04/14/2014    Darrel Hoover  PT 08/31/2016, 10:53 AM  [facility] [address] [place], [place], [address] Phone: [phone]   Fax:  [phone]  Name: ASTYN BRITTEN MRN: [id number] Date of Birth: [birth year]

## 2016-09-04 ENCOUNTER — Ambulatory Visit: Payer: BLUE CROSS/BLUE SHIELD | Admitting: Physical Therapy

## 2016-09-04 DIAGNOSIS — M545 Low back pain, unspecified: Secondary | ICD-10-CM

## 2016-09-04 DIAGNOSIS — M542 Cervicalgia: Secondary | ICD-10-CM | POA: Diagnosis not present

## 2016-09-04 DIAGNOSIS — R293 Abnormal posture: Secondary | ICD-10-CM

## 2016-09-04 DIAGNOSIS — R252 Cramp and spasm: Secondary | ICD-10-CM | POA: Diagnosis not present

## 2016-09-04 NOTE — Patient Instructions (Signed)
Levator Stretch   Grasp seat or sit on hand on side to be stretched. Turn head toward other side and look down. Use hand on head to gently stretch neck in that position. Hold _20___ seconds. Repeat on other side. Repeat __2__ times. Do _2___ sessions per day.  http://gt2.exer.us/30   Copyright  VHI. All rights reserved.  Side-Bending   One hand on opposite side of head, pull head to side as far as is comfortable. Stop if there is pain. Hold ___20_ seconds. Repeat with other hand to other side. Repeat __2__ times. Do _2___ sessions per day.   Copyright  VHI. All rights reserved.  Scapular Retraction (Standing)   With arms at sides, pinch shoulder blades together. Repeat __10__ times per set. Do _2___ sets per session. Do __2__ sessions per day.  Stretch Break - Chin Tuck    Looking straight forward, tuck chin and hold _5___ seconds. Relax and return to starting position. Repeat _10___ times every _3___ hours.  Copyright  VHI. All rights reserved.

## 2016-09-04 NOTE — Therapy (Addendum)
[place], [place], [address] Phone: [phone]   Fax:  [phone]  Physical Therapy Treatment  Patient Details  Name: CHERINE DRUMGOOLE MRN: [id number] Date of Birth: 1955/06/19 Referring Provider: Lynne Leader MD  Encounter Date: 09/04/2016      PT End of Session - 09/04/16 1118    Visit Number 2   Number of Visits 12   Date for PT Re-Evaluation 10/12/16   PT Start Time 1100   PT Stop Time 1145   PT Time Calculation (min) 45 min      Past Medical History:  Diagnosis Date  . Allergy    --takes allergy injections  . Hyperlipidemia   . Perimenopause     Past Surgical History:  Procedure Laterality Date  . BREAST SURGERY    . KNEE SURGERY Bilateral   . REFRACTIVE SURGERY Bilateral     There were no vitals filed for this visit.      Subjective Assessment - 09/04/16 1112    Subjective i feel like i have a string pulling through my shoulder and my low back. It feels like it needs to pop   Pain Score 0-No pain   Pain Location Head   Pain Score 0   Pain Location Neck   Pain Score 3   Pain Location Back   Pain Orientation Lower                         [facility] Adult PT Treatment/Exercise - 09/04/16 0001      Neck Exercises: Seated   Neck Retraction 10 reps   Other Seated Exercise scap squeeze x 10     Neck Exercises: Supine   Neck Retraction 10 reps     Lumbar Exercises: Stretches   Single Knee to Chest Stretch 3 reps;30 seconds   Lower Trunk Rotation 5 reps;10 seconds   Pelvic Tilt 1 rep;5 reps;10 seconds     Lumbar Exercises: Supine   Bridge 20 reps   Bridge Limitations with pelvic tilt     Neck Exercises: Stretches   Upper Trapezius Stretch 2 reps;20 seconds   Levator Stretch 2 reps;20 seconds                  PT Short Term Goals - 09/04/16 1615      PT SHORT TERM GOAL #1   Title She will be indendent with inital HEP   Time 3   Period Weeks   Status Partially Met     PT SHORT TERM GOAL #2   Title She will report headaches decreased 50% or more   Time 3   Period Weeks   Status On-going     PT SHORT TERM GOAL #3   Title LBP decreased 50% or more    Time 3   Period Weeks   Status On-going           PT Long Term Goals - 08/31/16 1042      PT LONG TERM GOAL #1   Title She will demo awarenesss of good posture   Time 3   Period Weeks   Status New     PT LONG TERM GOAL #2   Title She will be independent with all HEP issued   Time 6   Period Weeks   Status New     PT LONG TERM GOAL #3   Title She will report pain decreased 80% or more and be  intermittant   Time 6   Period Weeks     PT LONG TERM GOAL #4   Title headaches will be eliminated    Time 6   Period Weeks   Status New     PT LONG TERM GOAL #5   Title neck pain decreased 80% and intermittant   Time 6   Period Weeks   Status New     Additional Long Term Goals   Additional Long Term Goals Yes     PT LONG TERM GOAL #6   Title She will perform all normal home and work activity with normal pace and frequency   Time 6   Period Weeks   Status New               Plan - 09/04/16 1613    Clinical Impression Statement Pt reports HEP increases her back pain. We reviewed the pelvic tilt/bridge exercises and cued her to keep in a comfortable ROM. We began neck stretches with pt reporting pulling in neck and shoulder. Updated HEP to include neck tension HEP.    PT Next Visit Plan MAnual to back and neck , check new neck HEP- add core to HEP , modalities as needed.       Patient will benefit from skilled therapeutic intervention in order to improve the following deficits and impairments:  Decreased activity tolerance, Pain, Postural dysfunction, Increased muscle spasms  Visit Diagnosis: Bilateral low back pain without sciatica  Abnormal posture  Cramp and spasm  Cervicalgia     Problem List Patient Active Problem List   Diagnosis Date  Noted  . Rhinitis, allergic 06/11/2016  . Obesity 01/11/2016  . Squamous cell carcinoma ([place]) 09/21/2014  . Chronic allergic rhinitis 05/30/2014  . Elevated LDL cholesterol level 04/14/2014  . Allergic rhinitis 04/14/2014  . Seasonal allergies 04/14/2014  . OAB (overactive bladder) 04/14/2014    Dorene Ar , PTA 09/04/2016, 4:20 PM  [facility] [address] [place], [place], [address] Phone: [phone]   Fax:  [phone]  Name: TATIJANA BIERLY MRN: [id number] Date of Birth: December 12, 1955   PHYSICAL THERAPY DISCHARGE SUMMARY  Visits from Start of Care: 2  Current functional level related to goals / functional outcomes: She canceled her appointments and opted to see a chiropractor   Remaining deficits: Unknown   Education / Equipment: NA Plan: Patient agrees to discharge.  Patient goals were not met. Patient is being discharged due to                                                     ?????    She has gone to see a chiropractor                                     Darrel Hoover   PT   10/15/16  2:01 PM

## 2016-09-11 ENCOUNTER — Ambulatory Visit: Payer: BLUE CROSS/BLUE SHIELD | Admitting: Physical Therapy

## 2016-09-11 DIAGNOSIS — J301 Allergic rhinitis due to pollen: Secondary | ICD-10-CM | POA: Diagnosis not present

## 2016-09-11 DIAGNOSIS — J3089 Other allergic rhinitis: Secondary | ICD-10-CM | POA: Diagnosis not present

## 2016-09-18 ENCOUNTER — Encounter: Payer: BLUE CROSS/BLUE SHIELD | Admitting: Physical Therapy

## 2016-09-18 DIAGNOSIS — J301 Allergic rhinitis due to pollen: Secondary | ICD-10-CM | POA: Diagnosis not present

## 2016-09-18 DIAGNOSIS — J3089 Other allergic rhinitis: Secondary | ICD-10-CM | POA: Diagnosis not present

## 2016-09-25 ENCOUNTER — Encounter: Payer: BLUE CROSS/BLUE SHIELD | Admitting: Physical Therapy

## 2016-09-27 DIAGNOSIS — J301 Allergic rhinitis due to pollen: Secondary | ICD-10-CM | POA: Diagnosis not present

## 2016-09-27 DIAGNOSIS — J3089 Other allergic rhinitis: Secondary | ICD-10-CM | POA: Diagnosis not present

## 2016-10-10 DIAGNOSIS — J301 Allergic rhinitis due to pollen: Secondary | ICD-10-CM | POA: Diagnosis not present

## 2016-10-10 DIAGNOSIS — J3089 Other allergic rhinitis: Secondary | ICD-10-CM | POA: Diagnosis not present

## 2016-10-24 DIAGNOSIS — J301 Allergic rhinitis due to pollen: Secondary | ICD-10-CM | POA: Diagnosis not present

## 2016-10-24 DIAGNOSIS — J3089 Other allergic rhinitis: Secondary | ICD-10-CM | POA: Diagnosis not present

## 2016-11-07 DIAGNOSIS — J3089 Other allergic rhinitis: Secondary | ICD-10-CM | POA: Diagnosis not present

## 2016-11-07 DIAGNOSIS — J301 Allergic rhinitis due to pollen: Secondary | ICD-10-CM | POA: Diagnosis not present

## 2016-11-21 DIAGNOSIS — J3089 Other allergic rhinitis: Secondary | ICD-10-CM | POA: Diagnosis not present

## 2016-11-21 DIAGNOSIS — J301 Allergic rhinitis due to pollen: Secondary | ICD-10-CM | POA: Diagnosis not present

## 2016-12-05 DIAGNOSIS — J301 Allergic rhinitis due to pollen: Secondary | ICD-10-CM | POA: Diagnosis not present

## 2016-12-05 DIAGNOSIS — J3089 Other allergic rhinitis: Secondary | ICD-10-CM | POA: Diagnosis not present

## 2016-12-19 DIAGNOSIS — J3089 Other allergic rhinitis: Secondary | ICD-10-CM | POA: Diagnosis not present

## 2016-12-19 DIAGNOSIS — J301 Allergic rhinitis due to pollen: Secondary | ICD-10-CM | POA: Diagnosis not present

## 2017-01-01 DIAGNOSIS — J3089 Other allergic rhinitis: Secondary | ICD-10-CM | POA: Diagnosis not present

## 2017-01-01 DIAGNOSIS — J301 Allergic rhinitis due to pollen: Secondary | ICD-10-CM | POA: Diagnosis not present

## 2017-01-03 DIAGNOSIS — J3089 Other allergic rhinitis: Secondary | ICD-10-CM | POA: Diagnosis not present

## 2017-01-03 DIAGNOSIS — J301 Allergic rhinitis due to pollen: Secondary | ICD-10-CM | POA: Diagnosis not present

## 2017-01-04 DIAGNOSIS — G4733 Obstructive sleep apnea (adult) (pediatric): Secondary | ICD-10-CM | POA: Diagnosis not present

## 2017-01-14 ENCOUNTER — Other Ambulatory Visit: Payer: Self-pay | Admitting: *Deleted

## 2017-01-14 DIAGNOSIS — J301 Allergic rhinitis due to pollen: Secondary | ICD-10-CM | POA: Diagnosis not present

## 2017-01-14 DIAGNOSIS — J3089 Other allergic rhinitis: Secondary | ICD-10-CM | POA: Diagnosis not present

## 2017-01-14 MED ORDER — MONTELUKAST SODIUM 10 MG PO TABS: ORAL_TABLET | ORAL | 4 refills | Status: DC

## 2017-01-14 MED ORDER — LOVASTATIN 40 MG PO TABS
40.0000 mg | ORAL_TABLET | Freq: Every day | ORAL | 4 refills | Status: DC
Start: 2017-01-14 — End: 2018-02-14

## 2017-01-29 DIAGNOSIS — J301 Allergic rhinitis due to pollen: Secondary | ICD-10-CM | POA: Diagnosis not present

## 2017-01-29 DIAGNOSIS — J3089 Other allergic rhinitis: Secondary | ICD-10-CM | POA: Diagnosis not present

## 2017-02-06 DIAGNOSIS — J3089 Other allergic rhinitis: Secondary | ICD-10-CM | POA: Diagnosis not present

## 2017-02-06 DIAGNOSIS — J301 Allergic rhinitis due to pollen: Secondary | ICD-10-CM | POA: Diagnosis not present

## 2017-02-18 ENCOUNTER — Ambulatory Visit: Payer: BLUE CROSS/BLUE SHIELD | Admitting: Obstetrics and Gynecology

## 2017-02-20 DIAGNOSIS — J3089 Other allergic rhinitis: Secondary | ICD-10-CM | POA: Diagnosis not present

## 2017-02-20 DIAGNOSIS — J301 Allergic rhinitis due to pollen: Secondary | ICD-10-CM | POA: Diagnosis not present

## 2017-02-27 DIAGNOSIS — L820 Inflamed seborrheic keratosis: Secondary | ICD-10-CM | POA: Diagnosis not present

## 2017-02-27 DIAGNOSIS — D225 Melanocytic nevi of trunk: Secondary | ICD-10-CM | POA: Diagnosis not present

## 2017-02-27 DIAGNOSIS — Z85828 Personal history of other malignant neoplasm of skin: Secondary | ICD-10-CM | POA: Diagnosis not present

## 2017-02-27 DIAGNOSIS — L821 Other seborrheic keratosis: Secondary | ICD-10-CM | POA: Diagnosis not present

## 2017-02-27 DIAGNOSIS — L720 Epidermal cyst: Secondary | ICD-10-CM | POA: Diagnosis not present

## 2017-02-28 ENCOUNTER — Ambulatory Visit: Payer: BLUE CROSS/BLUE SHIELD | Admitting: Obstetrics and Gynecology

## 2017-03-01 ENCOUNTER — Other Ambulatory Visit: Payer: Self-pay | Admitting: *Deleted

## 2017-03-01 MED ORDER — LEVOCETIRIZINE DIHYDROCHLORIDE 5 MG PO TABS: ORAL_TABLET | ORAL | 4 refills | Status: DC

## 2017-03-06 ENCOUNTER — Ambulatory Visit (INDEPENDENT_AMBULATORY_CARE_PROVIDER_SITE_OTHER): Payer: BLUE CROSS/BLUE SHIELD | Admitting: Obstetrics and Gynecology

## 2017-03-06 ENCOUNTER — Encounter: Payer: Self-pay | Admitting: Obstetrics and Gynecology

## 2017-03-06 VITALS — BP 142/80 | HR 56 | Resp 20 | Ht 60.0 in | Wt 189.0 lb

## 2017-03-06 DIAGNOSIS — R8290 Unspecified abnormal findings in urine: Secondary | ICD-10-CM | POA: Diagnosis not present

## 2017-03-06 DIAGNOSIS — N898 Other specified noninflammatory disorders of vagina: Secondary | ICD-10-CM | POA: Diagnosis not present

## 2017-03-06 DIAGNOSIS — Z01419 Encounter for gynecological examination (general) (routine) without abnormal findings: Secondary | ICD-10-CM | POA: Diagnosis not present

## 2017-03-06 DIAGNOSIS — Z1151 Encounter for screening for human papillomavirus (HPV): Secondary | ICD-10-CM | POA: Diagnosis not present

## 2017-03-06 LAB — POCT URINALYSIS DIPSTICK
Bilirubin, UA: NEGATIVE
Glucose, UA: NEGATIVE
Ketones, UA: NEGATIVE
Leukocytes, UA: NEGATIVE
Nitrite, UA: NEGATIVE
PROTEIN UA: NEGATIVE
Urobilinogen, UA: NEGATIVE
pH, UA: 5

## 2017-03-06 NOTE — Progress Notes (Signed)
62 y.o. G41P2011 Widowed Caucasian female here for annual exam.     Denies vaginal bleeding.   Patient had a normal CT of abdomen and pelvis and a normal pelvic ultrasound in 2015 for symptoms of right lower quadrant pain.  Notes urine odor/vaginal odor.   Daughter will move in next door.  Patient just did her own house remodeling.  Mother having ovarian cancer recurrence  PCP: Iran Planas, PA-C    Patient's last menstrual period was 12/31/2009 (approximate).     Urine PYP:PJKDT RBCs        Sexually active: No. female The current method of family planning is post menopausal status.    Exercising: No.   Smoker:  no  Health Maintenance: Pap: 02-09-15 Neg:Neg HR HPV; 01-16-13 Neg:Neg HR HPV  History of abnormal Pap:  OIZ,1245 colposcopy/cryotherapy to cervix.  Paps normal since.   MMG: 02-16-16 Density B/Neg/BiRads1:[facility]. Colonoscopy:  01-25-16 tubular adenomatous with [facility] in [place] due 12/2020. BMD: n/a Result  n/a TDaP:  02-08-15 Gardasil:   N/A HIV:  Donates blood. Hep C:  Donates blood. Screening Labs:  Hb today: PCP, Urine today:    reports that she has never smoked. She has never used smokeless tobacco. She reports that she does not drink alcohol or use drugs.  Past Medical History:  Diagnosis Date  . Allergy    --takes allergy injections  . Hyperlipidemia   . Perimenopause     Past Surgical History:  Procedure Laterality Date  . BREAST SURGERY    . KNEE SURGERY Bilateral   . REFRACTIVE SURGERY Bilateral     Current Outpatient Prescriptions  Medication Sig Dispense Refill  . azelastine (ASTELIN) 0.1 % nasal spray   5  . Beclomethasone Dipropionate 80 MCG/ACT AERS Place 2 sprays into the nose daily as needed. 26.1 g 1  . ibuprofen (ADVIL,MOTRIN) 800 MG tablet Take 1 tablet (800 mg total) by mouth every 8 (eight) hours as needed. 30 tablet 0  . levocetirizine (XYZAL) 5 MG tablet TAKE 1 TABLET (5 MG TOTAL) BY MOUTH  EVERY EVENING. 90 tablet 4  . lovastatin (MEVACOR) 40 MG tablet Take 1 tablet (40 mg total) by mouth at bedtime. 90 tablet 4  . metroNIDAZOLE (METROGEL) 1 % gel Apply topically as needed.    . mirabegron ER (MYRBETRIQ) 50 MG TB24 tablet Take 1 tablet (50 mg total) by mouth daily. (Patient taking differently: Take 50 mg by mouth daily. Takes as needed) 90 tablet 1  . montelukast (SINGULAIR) 10 MG tablet TAKE 1 TABLET (10 MG TOTAL) BY MOUTH AT BEDTIME. 90 tablet 4  . Olopatadine HCl (PATADAY) 0.2 % SOLN Place 1 drop into both eyes 2 (two) times daily.    Marland Kitchen OVER THE COUNTER MEDICATION 2 (two) times daily. Zyflamend    . PROAIR RESPICLICK 809 (90 BASE) MCG/ACT AEPB   3  . traZODone (DESYREL) 50 MG tablet Take 0.5-1 tablets (25-50 mg total) by mouth at bedtime as needed for sleep. 30 tablet 1  . UNABLE TO FIND Med Name: Allergy Shots     No current facility-administered medications for this visit.     Family History  Problem Relation Age of Onset  . Hypertension Mother   . Stroke Mother   . Cancer Mother 73    ovarian ca, hx of TTP  . Hyperlipidemia Mother   . Ovarian cancer Mother   . Hypertension Father   . Cancer Father 92    bladder ca  . Hyperlipidemia  Father   . Dementia Father   . Anemia Sister   . Diabetes Brother   . Mental illness Sister     mentally disabled--lives in group home in [place]  . Asthma Brother     allergy induced    ROS:  Pertinent items are noted in HPI.  Otherwise, a comprehensive ROS was negative.  Exam:   BP (!) 142/80 (BP Location: Right Arm, Patient Position: Sitting, Cuff Size: Normal)   Pulse (!) 56   Resp 20   Ht 5' (1.524 m)   Wt 189 lb (85.7 kg)   LMP 12/31/2009 (Approximate)   BMI 36.91 kg/m     General appearance: alert, cooperative and appears stated age Head: Normocephalic, without obvious abnormality, atraumatic Neck: no adenopathy, supple, symmetrical, trachea midline and thyroid normal to inspection and palpation Lungs: clear to  auscultation bilaterally Breasts: consistent with bilateral breast reduction, no masses or tenderness, No nipple retraction or dimpling, No nipple discharge or bleeding, No axillary or supraclavicular adenopathy Heart: regular rate and rhythm Abdomen: soft, non-tender; no masses, no organomegaly Extremities: extremities normal, atraumatic, no cyanosis or edema Skin: Skin color, texture, turgor normal. No rashes or lesions Lymph nodes: Cervical, supraclavicular, and axillary nodes normal. No abnormal inguinal nodes palpated Neurologic: Grossly normal  Pelvic: External genitalia:  no lesions              Urethra:  normal appearing urethra with no masses, tenderness or lesions              Bartholins and Skenes: normal                 Vagina: normal appearing vagina with normal color and discharge, no lesions              Cervix: no lesions              Pap taken:  Yes. Bimanual Exam:  Uterus:  normal size, contour, position, consistency, mobility, non-tender              Adnexa: no mass, fullness, tenderness              Rectal exam: Yes.  .  Confirms.              Anus:  normal sphincter tone, no lesions  Chaperone was present for exam.  Assessment:   Well woman visit with normal exam. FH ovarian cancer.  Patient with normal CT and ultrasound in 2015. Status post bilateral breast reduction.  Vaginal/urine odor.  Plan: Mammogram screening discussed.  We discussed 3 D mammogram, risks and benefits. Recommended self breast awareness. Pap and HR HPV as above. Guidelines for Calcium, Vitamin D, regular exercise program including cardiovascular and weight bearing exercise. Urine culture and Affirm taken. Labs through PCP. Follow up annually and prn.    After visit summary provided.

## 2017-03-06 NOTE — Patient Instructions (Signed)

## 2017-03-07 ENCOUNTER — Encounter: Payer: Self-pay | Admitting: Obstetrics and Gynecology

## 2017-03-07 LAB — WET PREP BY MOLECULAR PROBE
CANDIDA SPECIES: NOT DETECTED
Gardnerella vaginalis: NOT DETECTED
TRICHOMONAS VAG: NOT DETECTED

## 2017-03-07 LAB — URINE CULTURE: Organism ID, Bacteria: NO GROWTH

## 2017-03-08 LAB — IPS PAP TEST WITH HPV

## 2017-03-11 DIAGNOSIS — J301 Allergic rhinitis due to pollen: Secondary | ICD-10-CM | POA: Diagnosis not present

## 2017-03-11 DIAGNOSIS — J3089 Other allergic rhinitis: Secondary | ICD-10-CM | POA: Diagnosis not present

## 2017-03-20 ENCOUNTER — Ambulatory Visit (INDEPENDENT_AMBULATORY_CARE_PROVIDER_SITE_OTHER): Payer: BLUE CROSS/BLUE SHIELD | Admitting: Physician Assistant

## 2017-03-20 VITALS — BP 128/74 | HR 71 | Temp 100.7°F | Wt 188.0 lb

## 2017-03-20 DIAGNOSIS — R6889 Other general symptoms and signs: Secondary | ICD-10-CM | POA: Diagnosis not present

## 2017-03-20 LAB — POCT INFLUENZA A/B
INFLUENZA A, POC: NEGATIVE
Influenza B, POC: NEGATIVE

## 2017-03-20 MED ORDER — OSELTAMIVIR PHOSPHATE 75 MG PO CAPS: 75.0000 mg | ORAL_CAPSULE | Freq: Two times a day (BID) | ORAL | 0 refills | Status: DC

## 2017-03-20 NOTE — Progress Notes (Signed)
HPI:                                                                Rebekah [name] is a 62 y.o. female who presents to [place]: [place] today for fever and flu-like symptoms  Patient with history of chronic allergic rhinitis reports sudden onset fever, malaise, and myalgias x 2 days. Fever was 101 F. Endorses associated congested, sore throat, cough and headache. She had one brief episode of nausea today. Denies vomiting, diarrhea or abdominal pain. She has been taking Mucinex and Aleve. Nonsmoker. No history of lung disease.   Past Medical History:  Diagnosis Date  . Allergy    --takes allergy injections  . Hyperlipidemia   . Perimenopause    Past Surgical History:  Procedure Laterality Date  . BREAST SURGERY    . KNEE SURGERY Bilateral   . REFRACTIVE SURGERY Bilateral    Social History  Substance Use Topics  . Smoking status: Never Smoker  . Smokeless tobacco: Never Used  . Alcohol use No   family history includes Anemia in her sister; Asthma in her brother; Cancer (age of onset: 14) in her father; Cancer (age of onset: 41) in her mother; Dementia in her father; Diabetes in her brother; Hyperlipidemia in her father and mother; Hypertension in her father and mother; Mental illness in her sister; Ovarian cancer in her mother; Stroke in her mother.  ROS: negative except as noted in the HPI  Medications: Current Outpatient Prescriptions  Medication Sig Dispense Refill  . azelastine (ASTELIN) 0.1 % nasal spray   5  . Beclomethasone Dipropionate 80 MCG/ACT AERS Place 2 sprays into the nose daily as needed. 26.1 g 1  . ibuprofen (ADVIL,MOTRIN) 800 MG tablet Take 1 tablet (800 mg total) by mouth every 8 (eight) hours as needed. 30 tablet 0  . levocetirizine (XYZAL) 5 MG tablet TAKE 1 TABLET (5 MG TOTAL) BY MOUTH EVERY EVENING. 90 tablet 4  . lovastatin (MEVACOR) 40 MG tablet Take 1 tablet (40 mg total) by mouth at bedtime. 90 tablet 4  .  metroNIDAZOLE (METROGEL) 1 % gel Apply topically as needed.    . mirabegron ER (MYRBETRIQ) 50 MG TB24 tablet Take 1 tablet (50 mg total) by mouth daily. (Patient taking differently: Take 50 mg by mouth daily. Takes as needed) 90 tablet 1  . montelukast (SINGULAIR) 10 MG tablet TAKE 1 TABLET (10 MG TOTAL) BY MOUTH AT BEDTIME. 90 tablet 4  . Olopatadine HCl (PATADAY) 0.2 % SOLN Place 1 drop into both eyes 2 (two) times daily.    Marland Kitchen OVER THE COUNTER MEDICATION 2 (two) times daily. Zyflamend    . PROAIR RESPICLICK 202 (90 BASE) MCG/ACT AEPB   3  . traZODone (DESYREL) 50 MG tablet Take 0.5-1 tablets (25-50 mg total) by mouth at bedtime as needed for sleep. 30 tablet 1  . UNABLE TO FIND Med Name: Allergy Shots     No current facility-administered medications for this visit.    Allergies  Allergen Reactions  . Dust Mite Extract Other (See Comments)    Patient taking shots for dust mite and trees       Objective:  BP 128/74   Pulse 71   Temp (!) 100.7  F (38.2 C) (Oral)   Wt 188 lb (85.3 kg)   LMP 12/31/2009 (Approximate)   BMI 36.72 kg/m  Gen: well-groomed, cooperative,  ill-appearing, not toxic-appearing, no distress HEENT: normal conjunctiva, right TM occluded by cerumen, leftTM's clear, nasal mucosa edematous, there is rhinorrhea, oropharynx clear, no exudates, moist mucus membranes, no frontal or maxillary sinus tenderness Pulm: Normal work of breathing, normal phonation, clear to auscultation bilaterally, no wheezes, rales or rhonchi CV: Normal rate, regular rhythm, s1 and s2 distinct, no murmurs, clicks or rubs  Neuro: alert and oriented x 3, EOM's intact Lymph: no cervical or tonsillar adenopathy Skin: warm and dry, no rashes or lesions on exposed skin, no cyanosis   No results found for this or any previous visit (from the past 72 hour(s)). No results found.    Assessment and Plan: 62 y.o. female with   1. Flu-like symptoms - POCT Influenza A/B negative. High clinical  suspicion; she is febrile today. She is within the window for Tamiflu. No history of renal disease. - Alternate Tylenol and Aleve as needed for fever and body aches - Drink at least 1 liter of fluids daily - Rest - No work or being around elderly/childreen until symptom-free for at least 48-hours (no fever, body aches, etc.) - Return for worsening wheezing or shortness of breath - oseltamivir (TAMIFLU) 75 MG capsule; Take 1 capsule (75 mg total) by mouth 2 (two) times daily.  Dispense: 10 capsule; Refill: 0  Patient education and anticipatory guidance given Patient agrees with treatment plan Follow-up as needed if symptoms worsen or fail to improve  Darlyne Russian PA-C

## 2017-03-20 NOTE — Patient Instructions (Addendum)
- Alternate Tylenol and Aleve as needed for fever and body aches - Drink at least 1 liter of fluids daily - Rest - No work or being around elderly/childreen until symptom-free for at least 48-hours (no fever, body aches, etc.) - Return for worsening wheezing or shortness of breath  Influenza, Adult Influenza, more commonly known as "the flu," is a viral infection that primarily affects the respiratory tract. The respiratory tract includes organs that help you breathe, such as the lungs, nose, and throat. The flu causes many common cold symptoms, as well as a high fever and body aches. The flu spreads easily from person to person (is contagious). Getting a flu shot (influenza vaccination) every year is the best way to prevent influenza. What are the causes? Influenza is caused by a virus. You can catch the virus by:  Breathing in droplets from an infected person's cough or sneeze.  Touching something that was recently contaminated with the virus and then touching your mouth, nose, or eyes. What increases the risk? The following factors may make you more likely to get the flu:  Not cleaning your hands frequently with soap and water or alcohol-based hand sanitizer.  Having close contact with many people during cold and flu season.  Touching your mouth, eyes, or nose without washing or sanitizing your hands first.  Not drinking enough fluids or not eating a healthy diet.  Not getting enough sleep or exercise.  Being under a high amount of stress.  Not getting a yearly (annual) flu shot. You may be at a higher risk of complications from the flu, such as a severe lung infection (pneumonia), if you:  Are over the age of 21.  Are pregnant.  Have a weakened disease-fighting system (immune system). You may have a weakened immune system if you:  Have HIV or AIDS.  Are undergoing chemotherapy.  Aretaking medicines that reduce the activity of (suppress) the immune system.  Have a  long-term (chronic) illness, such as heart disease, kidney disease, diabetes, or lung disease.  Have a liver disorder.  Are obese.  Have anemia. What are the signs or symptoms? Symptoms of this condition typically last 4-10 days and may include:  Fever.  Chills.  Headache, body aches, or muscle aches.  Sore throat.  Cough.  Runny or congested nose.  Chest discomfort and cough.  Poor appetite.  Weakness or tiredness (fatigue).  Dizziness.  Nausea or vomiting. How is this diagnosed? This condition may be diagnosed based on your medical history and a physical exam. Your health care provider may do a nose or throat swab test to confirm the diagnosis. How is this treated? If influenza is detected early, you can be treated with antiviral medicine that can reduce the length of your illness and the severity of your symptoms. This medicine may be given by mouth (orally) or through an IV tube that is inserted in one of your veins. The goal of treatment is to relieve symptoms by taking care of yourself at home. This may include taking over-the-counter medicines, drinking plenty of fluids, and adding humidity to the air in your home. In some cases, influenza goes away on its own. Severe influenza or complications from influenza may be treated in a hospital. Follow these instructions at home:  Take over-the-counter and prescription medicines only as told by your health care provider.  Use a cool mist humidifier to add humidity to the air in your home. This can make breathing easier.  Rest as needed.  Drink enough fluid to keep your urine clear or pale yellow.  Cover your mouth and nose when you cough or sneeze.  Wash your hands with soap and water often, especially after you cough or sneeze. If soap and water are not available, use hand sanitizer.  Stay home from work or school as told by your health care provider. Unless you are visiting your health care provider, try to avoid  leaving home until your fever has been gone for 24 hours without the use of medicine.  Keep all follow-up visits as told by your health care provider. This is important. How is this prevented?  Getting an annual flu shot is the best way to avoid getting the flu. You may get the flu shot in late summer, fall, or winter. Ask your health care provider when you should get your flu shot.  Wash your hands often or use hand sanitizer often.  Avoid contact with people who are sick during cold and flu season.  Eat a healthy diet, drink plenty of fluids, get enough sleep, and exercise regularly. Contact a health care provider if:  You develop new symptoms.  You have:  Chest pain.  Diarrhea.  A fever.  Your cough gets worse.  You produce more mucus.  You feel nauseous or you vomit. Get help right away if:  You develop shortness of breath or difficulty breathing.  Your skin or nails turn a bluish color.  You have severe pain or stiffness in your neck.  You develop a sudden headache or sudden pain in your face or ear.  You cannot stop vomiting. This information is not intended to replace advice given to you by your health care provider. Make sure you discuss any questions you have with your health care provider. Document Released: 12/14/2000 Document Revised: 05/24/2016 Document Reviewed: 10/11/2015 Elsevier Interactive Patient Education  2017 [employer].

## 2017-04-08 DIAGNOSIS — J3089 Other allergic rhinitis: Secondary | ICD-10-CM | POA: Diagnosis not present

## 2017-04-08 DIAGNOSIS — J301 Allergic rhinitis due to pollen: Secondary | ICD-10-CM | POA: Diagnosis not present

## 2017-04-18 ENCOUNTER — Other Ambulatory Visit: Payer: Self-pay | Admitting: Obstetrics and Gynecology

## 2017-04-18 DIAGNOSIS — Z1239 Encounter for other screening for malignant neoplasm of breast: Secondary | ICD-10-CM

## 2017-05-06 DIAGNOSIS — J3089 Other allergic rhinitis: Secondary | ICD-10-CM | POA: Diagnosis not present

## 2017-05-06 DIAGNOSIS — J301 Allergic rhinitis due to pollen: Secondary | ICD-10-CM | POA: Diagnosis not present

## 2017-05-14 ENCOUNTER — Ambulatory Visit (INDEPENDENT_AMBULATORY_CARE_PROVIDER_SITE_OTHER): Payer: BLUE CROSS/BLUE SHIELD

## 2017-05-14 DIAGNOSIS — Z1231 Encounter for screening mammogram for malignant neoplasm of breast: Secondary | ICD-10-CM | POA: Diagnosis not present

## 2017-05-14 DIAGNOSIS — J301 Allergic rhinitis due to pollen: Secondary | ICD-10-CM | POA: Diagnosis not present

## 2017-05-14 DIAGNOSIS — Z1239 Encounter for other screening for malignant neoplasm of breast: Secondary | ICD-10-CM

## 2017-05-14 DIAGNOSIS — J3089 Other allergic rhinitis: Secondary | ICD-10-CM | POA: Diagnosis not present

## 2017-06-03 DIAGNOSIS — J3089 Other allergic rhinitis: Secondary | ICD-10-CM | POA: Diagnosis not present

## 2017-06-03 DIAGNOSIS — J301 Allergic rhinitis due to pollen: Secondary | ICD-10-CM | POA: Diagnosis not present

## 2017-06-25 DIAGNOSIS — J301 Allergic rhinitis due to pollen: Secondary | ICD-10-CM | POA: Diagnosis not present

## 2017-06-25 DIAGNOSIS — J3089 Other allergic rhinitis: Secondary | ICD-10-CM | POA: Diagnosis not present

## 2017-07-13 DIAGNOSIS — J301 Allergic rhinitis due to pollen: Secondary | ICD-10-CM | POA: Diagnosis not present

## 2017-07-13 DIAGNOSIS — J3089 Other allergic rhinitis: Secondary | ICD-10-CM | POA: Diagnosis not present

## 2017-07-17 DIAGNOSIS — J3089 Other allergic rhinitis: Secondary | ICD-10-CM | POA: Diagnosis not present

## 2017-07-17 DIAGNOSIS — J301 Allergic rhinitis due to pollen: Secondary | ICD-10-CM | POA: Diagnosis not present

## 2017-08-08 DIAGNOSIS — J301 Allergic rhinitis due to pollen: Secondary | ICD-10-CM | POA: Diagnosis not present

## 2017-08-08 DIAGNOSIS — J3089 Other allergic rhinitis: Secondary | ICD-10-CM | POA: Diagnosis not present

## 2017-08-29 DIAGNOSIS — J3089 Other allergic rhinitis: Secondary | ICD-10-CM | POA: Diagnosis not present

## 2017-08-29 DIAGNOSIS — J301 Allergic rhinitis due to pollen: Secondary | ICD-10-CM | POA: Diagnosis not present

## 2017-09-24 DIAGNOSIS — J301 Allergic rhinitis due to pollen: Secondary | ICD-10-CM | POA: Diagnosis not present

## 2017-09-24 DIAGNOSIS — J3089 Other allergic rhinitis: Secondary | ICD-10-CM | POA: Diagnosis not present

## 2017-10-14 DIAGNOSIS — J3089 Other allergic rhinitis: Secondary | ICD-10-CM | POA: Diagnosis not present

## 2017-10-14 DIAGNOSIS — J301 Allergic rhinitis due to pollen: Secondary | ICD-10-CM | POA: Diagnosis not present

## 2017-10-28 ENCOUNTER — Ambulatory Visit (INDEPENDENT_AMBULATORY_CARE_PROVIDER_SITE_OTHER): Payer: BLUE CROSS/BLUE SHIELD | Admitting: Physician Assistant

## 2017-10-28 ENCOUNTER — Encounter: Payer: BLUE CROSS/BLUE SHIELD | Admitting: Physician Assistant

## 2017-10-28 ENCOUNTER — Encounter: Payer: Self-pay | Admitting: Physician Assistant

## 2017-10-28 VITALS — BP 120/68 | HR 50 | Ht 60.0 in | Wt 183.0 lb

## 2017-10-28 DIAGNOSIS — J301 Allergic rhinitis due to pollen: Secondary | ICD-10-CM | POA: Diagnosis not present

## 2017-10-28 DIAGNOSIS — J302 Other seasonal allergic rhinitis: Secondary | ICD-10-CM

## 2017-10-28 DIAGNOSIS — E6609 Other obesity due to excess calories: Secondary | ICD-10-CM

## 2017-10-28 DIAGNOSIS — Z6835 Body mass index (BMI) 35.0-35.9, adult: Secondary | ICD-10-CM

## 2017-10-28 DIAGNOSIS — Z23 Encounter for immunization: Secondary | ICD-10-CM | POA: Diagnosis not present

## 2017-10-28 DIAGNOSIS — E7800 Pure hypercholesterolemia, unspecified: Secondary | ICD-10-CM | POA: Diagnosis not present

## 2017-10-28 DIAGNOSIS — Z0000 Encounter for general adult medical examination without abnormal findings: Secondary | ICD-10-CM

## 2017-10-28 DIAGNOSIS — J3089 Other allergic rhinitis: Secondary | ICD-10-CM | POA: Diagnosis not present

## 2017-10-28 DIAGNOSIS — Z13220 Encounter for screening for lipoid disorders: Secondary | ICD-10-CM | POA: Diagnosis not present

## 2017-10-28 DIAGNOSIS — R05 Cough: Secondary | ICD-10-CM | POA: Diagnosis not present

## 2017-10-28 DIAGNOSIS — Z131 Encounter for screening for diabetes mellitus: Secondary | ICD-10-CM

## 2017-10-28 DIAGNOSIS — E66812 Obesity, class 2: Secondary | ICD-10-CM

## 2017-10-28 LAB — LIPID PANEL W/REFLEX DIRECT LDL
CHOL/HDL RATIO: 3.4 (calc) (ref <5.0)
CHOLESTEROL: 190 mg/dL (ref <200)
HDL: 56 mg/dL (ref >50)
LDL CHOLESTEROL (CALC): 114 mg/dL — AB
Non-HDL Cholesterol (Calc): 134 mg/dL (calc) — ABNORMAL HIGH (ref <130)
TRIGLYCERIDES: 97 mg/dL (ref <150)

## 2017-10-28 LAB — COMPLETE METABOLIC PANEL WITH GFR
AG RATIO: 1.6 (calc) (ref 1.0–2.5)
ALBUMIN MSPROF: 4.4 g/dL (ref 3.6–5.1)
ALKALINE PHOSPHATASE (APISO): 69 U/L (ref 33–130)
ALT: 20 U/L (ref 6–29)
AST: 20 U/L (ref 10–35)
BILIRUBIN TOTAL: 0.4 mg/dL (ref 0.2–1.2)
BUN: 12 mg/dL (ref 7–25)
CO2: 27 mmol/L (ref 20–32)
CREATININE: 0.7 mg/dL (ref 0.50–0.99)
Calcium: 9 mg/dL (ref 8.6–10.4)
Chloride: 108 mmol/L (ref 98–110)
GFR, Est African American: 108 mL/min/{1.73_m2} (ref >=60)
GFR, Est Non African American: 94 mL/min/{1.73_m2} (ref >=60)
GLOBULIN: 2.7 g/dL (ref 1.9–3.7)
Glucose, Bld: 94 mg/dL (ref 65–99)
Potassium: 4 mmol/L (ref 3.5–5.3)
Sodium: 142 mmol/L (ref 135–146)
Total Protein: 7.1 g/dL (ref 6.1–8.1)

## 2017-10-28 LAB — TSH: TSH: 1.22 m[IU]/L (ref 0.40–4.50)

## 2017-10-28 NOTE — Patient Instructions (Addendum)

## 2017-10-28 NOTE — Progress Notes (Signed)
Subjective:     Rebekah [name] is a 62 y.o. female and is here for a comprehensive physical exam. The patient reports problems - she wants to lose weight. she is not exercising or dieting. .  Social History   Social History  . Marital status: Widowed    Spouse name: N/A  . Number of children: 2  . Years of education: N/A   Occupational History  . Not on file.   Social History Main Topics  . Smoking status: Never Smoker  . Smokeless tobacco: Never Used  . Alcohol use No  . Drug use: No  . Sexual activity: No   Other Topics Concern  . Not on file   Social History Narrative  . No narrative on file   Health Maintenance  Topic Date Due  . MAMMOGRAM  05/15/2019  . PAP SMEAR  03/06/2020  . TETANUS/TDAP  02/08/2025  . COLONOSCOPY  01/24/2026  . INFLUENZA VACCINE  Completed  . Hepatitis C Screening  Completed  . HIV Screening  Completed    The following portions of the patient's history were reviewed and updated as appropriate: allergies, current medications, past family history, past medical history, past social history, past surgical history and problem list.  Review of Systems Pertinent items noted in HPI and remainder of comprehensive ROS otherwise negative.   Objective:    BP 120/68   Pulse (!) 50   Ht 5' (1.524 m)   Wt 183 lb (83 kg)   LMP 12/31/2009 (Approximate)   BMI 35.74 kg/m  General appearance: alert, cooperative, appears stated age and moderately obese Head: Normocephalic, without obvious abnormality, atraumatic Eyes: conjunctivae/corneas clear. PERRL, EOM's intact. Fundi benign. Ears: normal TM's and external ear canals both ears Nose: Nares normal. Septum midline. Mucosa normal. No drainage or sinus tenderness. Throat: lips, mucosa, and tongue normal; teeth and gums normal Neck: no adenopathy, no carotid bruit, no JVD, supple, symmetrical, trachea midline and thyroid not enlarged, symmetric, no tenderness/mass/nodules Back: symmetric, no curvature.  ROM normal. No CVA tenderness. Lungs: clear to auscultation bilaterally Heart: regular rate and rhythm, S1, S2 normal, no murmur, click, rub or gallop Abdomen: soft, non-tender; bowel sounds normal; no masses,  no organomegaly Extremities: extremities normal, atraumatic, no cyanosis or edema Pulses: 2+ and symmetric Skin: Skin color, texture, turgor normal. No rashes or lesions Lymph nodes: Cervical, supraclavicular, and axillary nodes normal. Neurologic: Alert and oriented X 3, normal strength and tone. Normal symmetric reflexes. Normal coordination and gait    Assessment:    Healthy female exam.      Plan:  Marland KitchenMarland KitchenShaheen was seen today for annual exam.  Diagnoses and all orders for this visit:  Routine physical examination -     Lipid Panel w/reflex Direct LDL -     COMPLETE METABOLIC PANEL WITH GFR -     TSH  Seasonal allergies  Class 2 obesity due to excess calories without serious comorbidity with body mass index (BMI) of 35.0 to 35.9 in adult -     TSH  Screening for lipid disorders -     Lipid Panel w/reflex Direct LDL  Screening for diabetes mellitus -     COMPLETE METABOLIC PANEL WITH GFR  Elevated LDL cholesterol level  OAB (overactive bladder)  Need for shingles vaccine -     Varicella-zoster vaccine IM (Shingrix)   .Marland Kitchen Depression screen PHQ 2/9 10/28/2017  Decreased Interest 0  Down, Depressed, Hopeless 0  PHQ - 2 Score 0  Altered sleeping 2  Tired, decreased energy 3  Change in appetite 3  Feeling bad or failure about yourself  0  Trouble concentrating 1  Moving slowly or fidgety/restless 0  Suicidal thoughts 0  PHQ-9 Score 9   Pt is taking care of her mother whose health is failing. She denies any counseling or medication.   Pt has GYN and up to date mammogram and pap.   Colonoscopy up to date.   .. Discussed 150 minutes of exercise a week.  Encouraged vitamin D 1000 units and Calcium 1300mg  or 4 servings of dairy a day.   Marland Kitchen.Discussed low carb  diet with 1500 calories and 80g of protein.  Exercising at least 150 minutes a week.  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  Discussed medications. She does not want to start any at this times. Follow up as needed.     See After Visit Summary for Counseling Recommendations

## 2017-10-30 NOTE — Progress Notes (Signed)
Call pt: cholesterol looks good. Goal is under 100 but I do not want to change any meds at this time. Continue current dose. Do you need refill? HDL is great.  Kidney, liver, glucose looks great.  Thyroid normal.

## 2017-11-12 DIAGNOSIS — J301 Allergic rhinitis due to pollen: Secondary | ICD-10-CM | POA: Diagnosis not present

## 2017-11-12 DIAGNOSIS — J3089 Other allergic rhinitis: Secondary | ICD-10-CM | POA: Diagnosis not present

## 2017-12-16 DIAGNOSIS — J301 Allergic rhinitis due to pollen: Secondary | ICD-10-CM | POA: Diagnosis not present

## 2017-12-16 DIAGNOSIS — J3089 Other allergic rhinitis: Secondary | ICD-10-CM | POA: Diagnosis not present

## 2017-12-25 ENCOUNTER — Ambulatory Visit (INDEPENDENT_AMBULATORY_CARE_PROVIDER_SITE_OTHER): Payer: BLUE CROSS/BLUE SHIELD | Admitting: Physician Assistant

## 2017-12-25 ENCOUNTER — Encounter: Payer: Self-pay | Admitting: Physician Assistant

## 2017-12-25 VITALS — BP 132/72 | HR 74 | Temp 98.1°F | Resp 16 | Ht 61.5 in | Wt 190.0 lb

## 2017-12-25 DIAGNOSIS — J0140 Acute pansinusitis, unspecified: Secondary | ICD-10-CM

## 2017-12-25 DIAGNOSIS — H6983 Other specified disorders of Eustachian tube, bilateral: Secondary | ICD-10-CM | POA: Diagnosis not present

## 2017-12-25 MED ORDER — METHYLPREDNISOLONE SODIUM SUCC 125 MG IJ SOLR
125.0000 mg | Freq: Once | INTRAMUSCULAR | Status: AC
  Administered 2017-12-25: 125 mg via INTRAMUSCULAR

## 2017-12-25 MED ORDER — AMOXICILLIN-POT CLAVULANATE 875-125 MG PO TABS: 1.0000 | ORAL_TABLET | Freq: Two times a day (BID) | ORAL | 0 refills | Status: DC

## 2017-12-25 NOTE — Progress Notes (Signed)
   Subjective:    Patient ID: Rebekah [name], female    DOB: 1955-03-06, 62 y.o.   MRN: [id number]  HPI  Pt is a 62 yo female with hx of environmental allergies, allergic rhinitis who presents to the clinic with 1 week of "ears being completely stopped up" and sinus pressure. No fever, chills, SOB, wheezing. She feels lots of pressure in ears. Dry coughing. She has taken hyquial, dayquil, flonase, astelin with little relief. No recent travel.   .. Active Ambulatory Problems    Diagnosis Date Noted  . Elevated LDL cholesterol level 04/14/2014  . Allergic rhinitis 04/14/2014  . Seasonal allergies 04/14/2014  . OAB (overactive bladder) 04/14/2014  . Chronic allergic rhinitis 05/30/2014  . Squamous cell carcinoma 09/21/2014  . Obesity 01/11/2016  . Rhinitis, allergic 06/11/2016   Resolved Ambulatory Problems    Diagnosis Date Noted  . No Resolved Ambulatory Problems   Past Medical History:  Diagnosis Date  . Allergy   . Hyperlipidemia   . Perimenopause       Review of Systems  All other systems reviewed and are negative.      Objective:   Physical Exam  Constitutional: She is oriented to person, place, and time. She appears well-developed and well-nourished.  HENT:  Head: Normocephalic and atraumatic.  Right Ear: External ear normal.  Left Ear: External ear normal.  TM's clear bilaterally. Good light reflex. No cerumen. TM's a little erythematous.  Tenderness over maxillary sinuses to palpation.  Nasal turbinates red and swollen.   Eyes: Conjunctivae are normal. Right eye exhibits no discharge. Left eye exhibits no discharge.  Cardiovascular: Normal rate, regular rhythm and normal heart sounds.  Pulmonary/Chest: Effort normal and breath sounds normal. She has no wheezes.  Lymphadenopathy:    She has cervical adenopathy.  Neurological: She is alert and oriented to person, place, and time.  Psychiatric: She has a normal mood and affect. Her behavior is normal.           Assessment & Plan:  Marland KitchenMarland KitchenCyril was seen today for bilateral stopped up ears and sore throat.  Diagnoses and all orders for this visit:  ETD (Eustachian tube dysfunction), bilateral -     methylPREDNISolone sodium succinate (SOLU-MEDROL) 125 mg/2 mL injection 125 mg  Acute non-recurrent pansinusitis -     amoxicillin-clavulanate (AUGMENTIN) 875-125 MG tablet; Take 1 tablet by mouth 2 (two) times daily. -     methylPREDNISolone sodium succinate (SOLU-MEDROL) 125 mg/2 mL injection 125 mg   reassured patient I did not see in ear infections. Solumedrol was given to see if will open up ears and relieve some of the pressure. Continue with symptomatic care. Get humidifer. If not improving in 2 days start augmentin for sinus infection.

## 2017-12-25 NOTE — Patient Instructions (Signed)
Eustachian Tube Dysfunction The eustachian tube connects the middle ear to the back of the nose. It regulates air pressure in the middle ear by allowing air to move between the ear and nose. It also helps to drain fluid from the middle ear space. When the eustachian tube does not function properly, air pressure, fluid, or both can build up in the middle ear. Eustachian tube dysfunction can affect one or both ears. What are the causes? This condition happens when the eustachian tube becomes blocked or cannot open normally. This may result from:  Ear infections.  Colds and other upper respiratory infections.  Allergies.  Irritation, such as from cigarette smoke or acid from the stomach coming up into the esophagus (gastroesophageal reflux).  Sudden changes in air pressure, such as from descending in an airplane.  Abnormal growths in the nose or throat, such as nasal polyps, tumors, or enlarged tissue at the back of the throat (adenoids).  What increases the risk? This condition may be more likely to develop in people who smoke and people who are overweight. Eustachian tube dysfunction may also be more likely to develop in children, especially children who have:  Certain birth defects of the mouth, such as cleft palate.  Large tonsils and adenoids.  What are the signs or symptoms? Symptoms of this condition may include:  A feeling of fullness in the ear.  Ear pain.  Clicking or popping noises in the ear.  Ringing in the ear.  Hearing loss.  Loss of balance.  Symptoms may get worse when the air pressure around you changes, such as when you travel to an area of high elevation or fly on an airplane. How is this diagnosed? This condition may be diagnosed based on:  Your symptoms.  A physical exam of your ear, nose, and throat.  Tests, such as those that measure: ? The movement of your eardrum (tympanogram). ? Your hearing (audiometry).  How is this treated? Treatment  depends on the cause and severity of your condition. If your symptoms are mild, you may be able to relieve your symptoms by moving air into ("popping") your ears. If you have symptoms of fluid in your ears, treatment may include:  Decongestants.  Antihistamines.  Nasal sprays or ear drops that contain medicines that reduce swelling (steroids).  In some cases, you may need to have a procedure to drain the fluid in your eardrum (myringotomy). In this procedure, a small tube is placed in the eardrum to:  Drain the fluid.  Restore the air in the middle ear space.  Follow these instructions at home:  Take over-the-counter and prescription medicines only as told by your health care provider.  Use techniques to help pop your ears as recommended by your health care provider. These may include: ? Chewing gum. ? Yawning. ? Frequent, forceful swallowing. ? Closing your mouth, holding your nose closed, and gently blowing as if you are trying to blow air out of your nose.  Do not do any of the following until your health care provider approves: ? Travel to high altitudes. ? Fly in airplanes. ? Work in a pressurized cabin or room. ? Scuba dive.  Keep your ears dry. Dry your ears completely after showering or bathing.  Do not smoke.  Keep all follow-up visits as told by your health care provider. This is important. Contact a health care provider if:  Your symptoms do not go away after treatment.  Your symptoms come back after treatment.  You are   unable to pop your ears.  You have: ? A fever. ? Pain in your ear. ? Pain in your head or neck. ? Fluid draining from your ear.  Your hearing suddenly changes.  You become very dizzy.  You lose your balance. This information is not intended to replace advice given to you by your health care provider. Make sure you discuss any questions you have with your health care provider. Document Released: 01/13/2016 Document Revised: 05/24/2016  Document Reviewed: 01/05/2015 Elsevier Interactive Patient Education  2018 [employer]  

## 2017-12-27 ENCOUNTER — Telehealth: Payer: Self-pay | Admitting: Physician Assistant

## 2017-12-27 NOTE — Telephone Encounter (Signed)
Patient advised.

## 2017-12-27 NOTE — Telephone Encounter (Signed)
I called patient and she states the day after the injection she had redness and swelling in the face. Denies shortness of breath or oral swelling. The redness and swelling is better today. She is going to go ahead and take the antibiotic. I advised patient if the redness and swelling doesn't resolve to go to the Urgent Care.

## 2017-12-27 NOTE — Telephone Encounter (Signed)
Pt called and left voicemail stating she was seen on Wenesday by Luvenia Starch and got a shot. She stated that her face got hot and flush yesterday and wanted to know if this was a side effect to the shot. She also stated her ear was still clogged and was wanting to know if she should go ahead and start her medication. Thanks

## 2017-12-27 NOTE — Telephone Encounter (Signed)
Start antibotic. If not improving by Monday will consider sending over medrol dose pack for symptoms.

## 2018-01-16 DIAGNOSIS — J3089 Other allergic rhinitis: Secondary | ICD-10-CM | POA: Diagnosis not present

## 2018-01-16 DIAGNOSIS — J301 Allergic rhinitis due to pollen: Secondary | ICD-10-CM | POA: Diagnosis not present

## 2018-01-28 ENCOUNTER — Encounter: Payer: Self-pay | Admitting: Physician Assistant

## 2018-01-28 ENCOUNTER — Ambulatory Visit (INDEPENDENT_AMBULATORY_CARE_PROVIDER_SITE_OTHER): Payer: BLUE CROSS/BLUE SHIELD | Admitting: Physician Assistant

## 2018-01-28 VITALS — BP 132/78 | HR 62 | Wt 185.0 lb

## 2018-01-28 DIAGNOSIS — E6609 Other obesity due to excess calories: Secondary | ICD-10-CM | POA: Diagnosis not present

## 2018-01-28 DIAGNOSIS — Z6835 Body mass index (BMI) 35.0-35.9, adult: Secondary | ICD-10-CM

## 2018-01-28 DIAGNOSIS — Z2911 Encounter for prophylactic immunotherapy for respiratory syncytial virus (RSV): Secondary | ICD-10-CM

## 2018-01-28 DIAGNOSIS — Z23 Encounter for immunization: Secondary | ICD-10-CM | POA: Diagnosis not present

## 2018-01-28 MED ORDER — PHENTERMINE HCL 37.5 MG PO TABS: 37.5000 mg | ORAL_TABLET | Freq: Every day | ORAL | 0 refills | Status: DC

## 2018-01-28 NOTE — Progress Notes (Signed)
   Subjective:    Patient ID: Rebekah [name], female    DOB: 1955-01-20, 63 y.o.   MRN: [id number]  HPI  Pt is a 63 yo female who presents to the clinic to follow up on weight. For the last 3 months she has tried to diet. She admits she is not exercising. She tried belviq in the past and seemed to work at first but then seemed to not work after a while. She is interested in phentermine. She denies any arrhythmia. She is getting a Wii to exercise with.  .. Active Ambulatory Problems    Diagnosis Date Noted  . Elevated LDL cholesterol level 04/14/2014  . Allergic rhinitis 04/14/2014  . Seasonal allergies 04/14/2014  . OAB (overactive bladder) 04/14/2014  . Chronic allergic rhinitis 05/30/2014  . Squamous cell carcinoma 09/21/2014  . Obesity 01/11/2016  . Rhinitis, allergic 06/11/2016   Resolved Ambulatory Problems    Diagnosis Date Noted  . No Resolved Ambulatory Problems   Past Medical History:  Diagnosis Date  . Allergy   . Hyperlipidemia   . Perimenopause       Review of Systems  All other systems reviewed and are negative.      Objective:   Physical Exam  Constitutional: She is oriented to person, place, and time. She appears well-developed and well-nourished.  Obese.   HENT:  Head: Normocephalic and atraumatic.  Cardiovascular: Normal rate, regular rhythm and normal heart sounds.  Pulmonary/Chest: Effort normal and breath sounds normal.  Neurological: She is alert and oriented to person, place, and time.  Psychiatric: She has a normal mood and affect. Her behavior is normal.          Assessment & Plan:  Marland KitchenMarland KitchenElie was seen today for follow-up.  Diagnoses and all orders for this visit:  Class 2 obesity due to excess calories without serious comorbidity with body mass index (BMI) of 35.0 to 35.9 in adult -     phentermine (ADIPEX-P) 37.5 MG tablet; Take 1 tablet (37.5 mg total) by mouth daily before breakfast.  Need for shingles vaccine -      Varicella-zoster vaccine subcutaneous  Other orders -     Cancel: Varicella-zoster vaccine subcutaneous -     Cancel: Varicella-zoster vaccine subcutaneous   .Marland KitchenDiscussed low carb diet with 1500 calories and 80g of protein.  Exercising at least 150 minutes a week.  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  Agreed to do a round of phentermine. Discussed side effects and typical duration of 3-6 months.  Follow up nurse visit in one month.

## 2018-01-31 ENCOUNTER — Encounter: Payer: Self-pay | Admitting: Physician Assistant

## 2018-02-02 ENCOUNTER — Encounter: Payer: Self-pay | Admitting: Physician Assistant

## 2018-02-05 DIAGNOSIS — J3089 Other allergic rhinitis: Secondary | ICD-10-CM | POA: Diagnosis not present

## 2018-02-05 DIAGNOSIS — J301 Allergic rhinitis due to pollen: Secondary | ICD-10-CM | POA: Diagnosis not present

## 2018-02-14 ENCOUNTER — Other Ambulatory Visit: Payer: Self-pay | Admitting: Physician Assistant

## 2018-02-19 DIAGNOSIS — J301 Allergic rhinitis due to pollen: Secondary | ICD-10-CM | POA: Diagnosis not present

## 2018-02-19 DIAGNOSIS — J3089 Other allergic rhinitis: Secondary | ICD-10-CM | POA: Diagnosis not present

## 2018-02-22 DIAGNOSIS — J3089 Other allergic rhinitis: Secondary | ICD-10-CM | POA: Diagnosis not present

## 2018-02-22 DIAGNOSIS — J301 Allergic rhinitis due to pollen: Secondary | ICD-10-CM | POA: Diagnosis not present

## 2018-02-24 ENCOUNTER — Ambulatory Visit: Payer: BLUE CROSS/BLUE SHIELD

## 2018-03-10 ENCOUNTER — Encounter: Payer: Self-pay | Admitting: Obstetrics and Gynecology

## 2018-03-10 ENCOUNTER — Other Ambulatory Visit: Payer: Self-pay

## 2018-03-10 ENCOUNTER — Ambulatory Visit (INDEPENDENT_AMBULATORY_CARE_PROVIDER_SITE_OTHER): Payer: BLUE CROSS/BLUE SHIELD | Admitting: Obstetrics and Gynecology

## 2018-03-10 VITALS — BP 138/80 | HR 64 | Resp 16 | Ht 60.5 in | Wt 177.0 lb

## 2018-03-10 DIAGNOSIS — Z01419 Encounter for gynecological examination (general) (routine) without abnormal findings: Secondary | ICD-10-CM

## 2018-03-10 NOTE — Patient Instructions (Signed)

## 2018-03-10 NOTE — Progress Notes (Signed)
63 y.o. G74P2011 Widowed Caucasian female here for annual exam.    Trying to loose weight.  Craving sweets.  Night urination - 2 x per hs. No change in this.  Tried Myrbetriq again, and it was helpful, but she stopped this.   Constipation.  Drinking coffee in the am.  Using Metamucil.  Doing a lot of dental work.   PCP:  Iran Planas, PAC   Patient's last menstrual period was 12/31/2009 (approximate).           Sexually active: No.  The current method of family planning is post menopausal status.    Exercising: Yes.    walking Smoker:  no  Health Maintenance: Pap:  03/06/17 pap and HR HPV negative History of abnormal Pap:  MGQ,6761 colposcopy/cryotherapy to cervix. Paps normal since MMG:  05/14/17 BIRADS 2 benign/density b Colonoscopy:  01-25-16 tubular adenomatous with [facility] in [place] due 12/2020. BMD:   n/a  Result  n/a TDaP:  02/08/15 Gardasil:   n/a HIV and Hep C: 01/11/16 Negative Screening Labs:   PCP.    reports that  has never smoked. she has never used smokeless tobacco. She reports that she drinks about 1.2 oz of alcohol per week. She reports that she does not use drugs.  Past Medical History:  Diagnosis Date  . Allergy    --takes allergy injections  . Hyperlipidemia   . Perimenopause     Past Surgical History:  Procedure Laterality Date  . BREAST SURGERY    . KNEE SURGERY Bilateral   . REFRACTIVE SURGERY Bilateral     Current Outpatient Medications  Medication Sig Dispense Refill  . albuterol (PROVENTIL HFA;VENTOLIN HFA) 108 (90 Base) MCG/ACT inhaler Inhale into the lungs.    Marland Kitchen azelastine (ASTELIN) 0.1 % nasal spray   5  . Beclomethasone Dipropionate 80 MCG/ACT AERS Place 2 sprays into the nose daily as needed. 26.1 g 1  . ibuprofen (ADVIL,MOTRIN) 800 MG tablet Take 1 tablet (800 mg total) by mouth every 8 (eight) hours as needed. 30 tablet 0  . levocetirizine (XYZAL) 5 MG tablet TAKE 1 TABLET (5 MG TOTAL) BY MOUTH EVERY  EVENING. 90 tablet 4  . lovastatin (MEVACOR) 40 MG tablet TAKE 1 TABLET (40 MG TOTAL) BY MOUTH AT BEDTIME. 90 tablet 1  . montelukast (SINGULAIR) 10 MG tablet TAKE 1 TABLET (10 MG TOTAL) BY MOUTH AT BEDTIME. 90 tablet 4  . Olopatadine HCl (PATADAY) 0.2 % SOLN Place 1 drop into both eyes 2 (two) times daily.    . Olopatadine HCl (PATADAY) 0.2 % SOLN Pataday 0.2 % eye drops  PLACE 1 DROP INTO BOTH EYES 2 (TWO) TIMES DAILY.    Marland Kitchen phentermine (ADIPEX-P) 37.5 MG tablet Take 1 tablet (37.5 mg total) by mouth daily before breakfast. 30 tablet 0  . PROAIR RESPICLICK 950 (90 BASE) MCG/ACT AEPB   3  . traZODone (DESYREL) 50 MG tablet Take 0.5-1 tablets (25-50 mg total) by mouth at bedtime as needed for sleep. 30 tablet 1  . UNABLE TO FIND Med Name: Allergy Shots     No current facility-administered medications for this visit.     Family History  Problem Relation Age of Onset  . Hypertension Mother   . Stroke Mother   . Cancer Mother 15       ovarian ca, hx of TTP  . Hyperlipidemia Mother   . Ovarian cancer Mother   . Hypertension Father   . Cancer Father 34  bladder ca  . Hyperlipidemia Father   . Dementia Father        lewy body dementia  . Anemia Sister   . Diabetes Brother   . Mental illness Sister        mentally disabled--lives in group home in [place]  . Asthma Brother        allergy induced    ROS:  Pertinent items are noted in HPI.  Otherwise, a comprehensive ROS was negative.  Exam:   BP 138/80 (BP Location: Right Arm, Patient Position: Sitting, Cuff Size: Normal)   Pulse 64   Resp 16   Ht 5' 0.5" (1.537 m)   Wt 177 lb (80.3 kg)   LMP 12/31/2009 (Approximate)   BMI 34.00 kg/m     General appearance: alert, cooperative and appears stated age Head: Normocephalic, without obvious abnormality, atraumatic Neck: no adenopathy, supple, symmetrical, trachea midline and thyroid normal to inspection and palpation Lungs: clear to auscultation bilaterally Breasts:  consistent with bilateral reduction, no masses or tenderness, No nipple retraction or dimpling, No nipple discharge or bleeding, No axillary or supraclavicular adenopathy Heart: regular rate and rhythm Abdomen: soft, non-tender; no masses, no organomegaly Extremities: extremities normal, atraumatic, no cyanosis or edema Skin: Skin color, texture, turgor normal. No rashes or lesions Lymph nodes: Cervical, supraclavicular, and axillary nodes normal. No abnormal inguinal nodes palpated Neurologic: Grossly normal  Pelvic: External genitalia:  no lesions              Urethra:  normal appearing urethra with no masses, tenderness or lesions              Bartholins and Skenes: normal                 Vagina: normal appearing vagina with normal color and discharge, no lesions              Cervix: no lesions              Pap taken: No. Bimanual Exam:  Uterus:  normal size, contour, position, consistency, mobility, non-tender              Adnexa: no mass, fullness, tenderness              Rectal exam: Yes.  .  Confirms.              Anus:  normal sphincter tone, no lesions  Chaperone was present for exam.  Assessment:   Well woman visit with normal exam. FH ovarian cancer.  Patient with normal CT and ultrasound in 2015. Status post bilateral breast reduction.  Doing weight loss.  On Phentermine.   Plan: Mammogram screening discussed. Recommended self breast awareness. Pap and HR HPV as above. Guidelines for Calcium, Vitamin D, regular exercise program including cardiovascular and weight bearing exercise. Follow up annually and prn.   After visit summary provided.

## 2018-03-12 DIAGNOSIS — J3089 Other allergic rhinitis: Secondary | ICD-10-CM | POA: Diagnosis not present

## 2018-03-12 DIAGNOSIS — J301 Allergic rhinitis due to pollen: Secondary | ICD-10-CM | POA: Diagnosis not present

## 2018-03-26 ENCOUNTER — Telehealth: Payer: Self-pay | Admitting: Obstetrics and Gynecology

## 2018-03-26 ENCOUNTER — Ambulatory Visit (INDEPENDENT_AMBULATORY_CARE_PROVIDER_SITE_OTHER): Payer: BLUE CROSS/BLUE SHIELD | Admitting: Physician Assistant

## 2018-03-26 ENCOUNTER — Encounter: Payer: Self-pay | Admitting: Obstetrics and Gynecology

## 2018-03-26 VITALS — BP 141/65 | HR 54 | Temp 98.0°F | Resp 16 | Wt 172.0 lb

## 2018-03-26 DIAGNOSIS — E6609 Other obesity due to excess calories: Secondary | ICD-10-CM

## 2018-03-26 DIAGNOSIS — Z6835 Body mass index (BMI) 35.0-35.9, adult: Secondary | ICD-10-CM

## 2018-03-26 MED ORDER — PHENTERMINE HCL 37.5 MG PO TABS
37.5000 mg | ORAL_TABLET | Freq: Every day | ORAL | 0 refills | Status: DC
Start: 2018-03-26 — End: 2018-04-25

## 2018-03-26 NOTE — Telephone Encounter (Signed)
Patient sent the following correspondence through [place]. Routing to triage to assist patient with request.  ----- Message from [place], Generic sent at 03/26/2018 8:17 AM EDT -----    Were there any test that we did during my visit that I need results from? Sorry, I could not remember.    PS> My weight today was 171!!!

## 2018-03-26 NOTE — Progress Notes (Signed)
HPI: Patient is here for a blood pressure and weight check. Patient denies chest pains, palpitations, shortness of breath or  medication problems. Patient advised that she did not begin taking medication until 02/25/18. Patient also advised that blood pressure may be elevated due her father falling at the nursing home and stress at her job.  Assessment and Plan: Patient has lost weight. 13lbs this month.  Blood pressure readings are not within normal range.  Per provider - patient advised to schedule follow up monthly nurse visit for weight and blood pressure check - if blood pressure readings remain elevated, strength of medication will be divided. Patient stated she understood.  Agree with above plan. Iran Planas PA-C

## 2018-04-03 DIAGNOSIS — J3089 Other allergic rhinitis: Secondary | ICD-10-CM | POA: Diagnosis not present

## 2018-04-03 DIAGNOSIS — J301 Allergic rhinitis due to pollen: Secondary | ICD-10-CM | POA: Diagnosis not present

## 2018-04-06 ENCOUNTER — Other Ambulatory Visit: Payer: Self-pay | Admitting: Physician Assistant

## 2018-04-16 DIAGNOSIS — L738 Other specified follicular disorders: Secondary | ICD-10-CM | POA: Diagnosis not present

## 2018-04-16 DIAGNOSIS — Z85828 Personal history of other malignant neoplasm of skin: Secondary | ICD-10-CM | POA: Diagnosis not present

## 2018-04-16 DIAGNOSIS — J3089 Other allergic rhinitis: Secondary | ICD-10-CM | POA: Diagnosis not present

## 2018-04-16 DIAGNOSIS — L918 Other hypertrophic disorders of the skin: Secondary | ICD-10-CM | POA: Diagnosis not present

## 2018-04-16 DIAGNOSIS — J301 Allergic rhinitis due to pollen: Secondary | ICD-10-CM | POA: Diagnosis not present

## 2018-04-16 DIAGNOSIS — L820 Inflamed seborrheic keratosis: Secondary | ICD-10-CM | POA: Diagnosis not present

## 2018-04-16 DIAGNOSIS — L718 Other rosacea: Secondary | ICD-10-CM | POA: Diagnosis not present

## 2018-04-25 ENCOUNTER — Ambulatory Visit (INDEPENDENT_AMBULATORY_CARE_PROVIDER_SITE_OTHER): Payer: BLUE CROSS/BLUE SHIELD | Admitting: Family Medicine

## 2018-04-25 ENCOUNTER — Encounter: Payer: Self-pay | Admitting: Physician Assistant

## 2018-04-25 ENCOUNTER — Ambulatory Visit: Payer: BLUE CROSS/BLUE SHIELD

## 2018-04-25 VITALS — BP 136/61 | HR 58 | Wt 164.0 lb

## 2018-04-25 DIAGNOSIS — J301 Allergic rhinitis due to pollen: Secondary | ICD-10-CM

## 2018-04-25 DIAGNOSIS — Z6835 Body mass index (BMI) 35.0-35.9, adult: Secondary | ICD-10-CM

## 2018-04-25 DIAGNOSIS — E6609 Other obesity due to excess calories: Secondary | ICD-10-CM | POA: Diagnosis not present

## 2018-04-25 MED ORDER — BECLOMETHASONE DIPROPIONATE 80 MCG/ACT NA AERS: 2.0000 | INHALATION_SPRAY | Freq: Every day | NASAL | 1 refills | Status: DC | PRN

## 2018-04-25 MED ORDER — PHENTERMINE HCL 37.5 MG PO TABS
37.5000 mg | ORAL_TABLET | Freq: Every day | ORAL | 0 refills | Status: DC
Start: 2018-04-25 — End: 2018-05-27

## 2018-04-25 NOTE — Progress Notes (Signed)
Agree with documentation as above.   Catherine Metheney, MD  

## 2018-04-25 NOTE — Progress Notes (Signed)
Patient is here for blood pressure and weight check. Denies any trouble sleeping, palpitations, or any other medication problems. Patient has lost weight. A refill for Phentermine will be sent to patient preferred pharmacy. Patient advised to schedule a four week nurse visit and keep her upcoming appointment with her PCP. Verbalized understanding.  Pt also requesting refill on her Beclomethasone Dipropionate 80 MCG/ACT AERS Rx. Pt states the allergy season has made her asthma worse and this inhaler works well as a daily use inhaler. Will pend.

## 2018-05-05 DIAGNOSIS — J3089 Other allergic rhinitis: Secondary | ICD-10-CM | POA: Diagnosis not present

## 2018-05-05 DIAGNOSIS — J301 Allergic rhinitis due to pollen: Secondary | ICD-10-CM | POA: Diagnosis not present

## 2018-05-13 ENCOUNTER — Other Ambulatory Visit: Payer: Self-pay | Admitting: Physician Assistant

## 2018-05-14 ENCOUNTER — Other Ambulatory Visit: Payer: Self-pay | Admitting: *Deleted

## 2018-05-14 MED ORDER — LEVOCETIRIZINE DIHYDROCHLORIDE 5 MG PO TABS: 5.0000 mg | ORAL_TABLET | Freq: Every evening | ORAL | 4 refills | Status: DC

## 2018-05-19 DIAGNOSIS — J3089 Other allergic rhinitis: Secondary | ICD-10-CM | POA: Diagnosis not present

## 2018-05-19 DIAGNOSIS — J301 Allergic rhinitis due to pollen: Secondary | ICD-10-CM | POA: Diagnosis not present

## 2018-05-27 ENCOUNTER — Ambulatory Visit (INDEPENDENT_AMBULATORY_CARE_PROVIDER_SITE_OTHER): Payer: BLUE CROSS/BLUE SHIELD | Admitting: Physician Assistant

## 2018-05-27 ENCOUNTER — Encounter: Payer: Self-pay | Admitting: Physician Assistant

## 2018-05-27 VITALS — BP 134/56 | HR 52 | Ht 65.0 in | Wt 162.0 lb

## 2018-05-27 DIAGNOSIS — R8290 Unspecified abnormal findings in urine: Secondary | ICD-10-CM

## 2018-05-27 DIAGNOSIS — R109 Unspecified abdominal pain: Secondary | ICD-10-CM | POA: Diagnosis not present

## 2018-05-27 DIAGNOSIS — E663 Overweight: Secondary | ICD-10-CM

## 2018-05-27 DIAGNOSIS — R319 Hematuria, unspecified: Secondary | ICD-10-CM | POA: Diagnosis not present

## 2018-05-27 LAB — POCT URINALYSIS DIPSTICK
BILIRUBIN UA: NEGATIVE
GLUCOSE UA: NEGATIVE
Ketones, UA: NEGATIVE
Leukocytes, UA: NEGATIVE
Nitrite, UA: NEGATIVE
Protein, UA: NEGATIVE
Spec Grav, UA: 1.025 (ref 1.010–1.025)
Urobilinogen, UA: 0.2 E.U./dL
pH, UA: 6 (ref 5.0–8.0)

## 2018-05-27 MED ORDER — PHENAZOPYRIDINE HCL 200 MG PO TABS: 200.0000 mg | ORAL_TABLET | Freq: Three times a day (TID) | ORAL | 0 refills | Status: AC

## 2018-05-27 MED ORDER — PHENTERMINE HCL 15 MG PO CAPS: 15.0000 mg | ORAL_CAPSULE | ORAL | 0 refills | Status: DC

## 2018-05-27 NOTE — Progress Notes (Signed)
Subjective:    Patient ID: Rebekah [name], female    DOB: Oct 04, 1955, 63 y.o.   MRN: [id number]  HPI  Patient is a 63 year old female who presents to the clinic for weight follow-up.  She has been on phentermine for approximately 3 months.  She often forgets to take it but on average taking it every other day.  She denies any side effects.  She has noticed a lot more energy and great appetite suppressant.  Her starting weight was 189 and she is now 162.  She has started tracking her walking habits.  During the week she does get around 4000 steps and over the weekend she gets almost 6,000.  Overall she feels like this is been very successful.  She does complain of some abnormal urine odor and lower abdominal pressure.  She has experienced this over the weekend.  She feels like she has not drink as much water she should be.  She denies any fever, chills, flank pain.  She has not tried anything to make better.  .. Active Ambulatory Problems    Diagnosis Date Noted  . Elevated LDL cholesterol level 04/14/2014  . Allergic rhinitis 04/14/2014  . Seasonal allergies 04/14/2014  . OAB (overactive bladder) 04/14/2014  . Chronic allergic rhinitis 05/30/2014  . Squamous cell carcinoma 09/21/2014  . Obesity 01/11/2016  . Rhinitis, allergic 06/11/2016   Resolved Ambulatory Problems    Diagnosis Date Noted  . No Resolved Ambulatory Problems   Past Medical History:  Diagnosis Date  . Allergy   . Hyperlipidemia   . Perimenopause      Review of Systems  All other systems reviewed and are negative.      Objective:   Physical Exam  Constitutional: She is oriented to person, place, and time. She appears well-developed and well-nourished.  HENT:  Head: Normocephalic and atraumatic.  Cardiovascular: Normal rate and regular rhythm.  Pulmonary/Chest: Effort normal and breath sounds normal.  Abdominal: Soft. Bowel sounds are normal. She exhibits no distension. There is no tenderness. There is no  guarding.  Neurological: She is alert and oriented to person, place, and time.  Psychiatric: She has a normal mood and affect. Her behavior is normal.          Assessment & Plan:  Marland KitchenMarland KitchenSamariya was seen today for obesity.  Diagnoses and all orders for this visit:  Abnormal urine odor -     POCT urinalysis dipstick -     Urine Culture -     phenazopyridine (PYRIDIUM) 200 MG tablet; Take 1 tablet (200 mg total) by mouth 3 (three) times daily for 2 days.  Hematuria, unspecified type -     Urine Culture  Overweight (BMI 25.0-29.9) -     phentermine 15 MG capsule; Take 1 capsule (15 mg total) by mouth every morning.  Abdominal pressure -     phenazopyridine (PYRIDIUM) 200 MG tablet; Take 1 tablet (200 mg total) by mouth 3 (three) times daily for 2 days.   .. Results for orders placed or performed in visit on 05/27/18  POCT urinalysis dipstick  Result Value Ref Range   Color, UA dark yellow    Clarity, UA clear    Glucose, UA Negative Negative   Bilirubin, UA neg    Ketones, UA neg    Spec Grav, UA 1.025 1.010 - 1.025   Blood, UA trace-lysed    pH, UA 6.0 5.0 - 8.0   Protein, UA Negative Negative   Urobilinogen, UA 0.2 0.2  or 1.0 E.U./dL   Nitrite, UA neg    Leukocytes, UA Negative Negative   Appearance     Odor     Will culture. Due to blood will recheck urine dipstick in 2-3 weeks to make sure resolved. pyridum given today. Encouraged to increase water intake.   Goal to lose another 10-15lbs. Cut back on phentermine. Follow up in 2 months. Increase steps to goal over [id number].

## 2018-05-28 LAB — URINE CULTURE
MICRO NUMBER:: 90642515
RESULT: NO GROWTH
SPECIMEN QUALITY:: ADEQUATE

## 2018-05-29 NOTE — Progress Notes (Signed)
Call pt: no growth on urine culture.

## 2018-06-10 DIAGNOSIS — J3089 Other allergic rhinitis: Secondary | ICD-10-CM | POA: Diagnosis not present

## 2018-06-10 DIAGNOSIS — J301 Allergic rhinitis due to pollen: Secondary | ICD-10-CM | POA: Diagnosis not present

## 2018-06-16 ENCOUNTER — Ambulatory Visit: Payer: BLUE CROSS/BLUE SHIELD

## 2018-06-17 DIAGNOSIS — J3089 Other allergic rhinitis: Secondary | ICD-10-CM | POA: Diagnosis not present

## 2018-06-17 DIAGNOSIS — J301 Allergic rhinitis due to pollen: Secondary | ICD-10-CM | POA: Diagnosis not present

## 2018-07-01 ENCOUNTER — Ambulatory Visit (INDEPENDENT_AMBULATORY_CARE_PROVIDER_SITE_OTHER): Payer: BLUE CROSS/BLUE SHIELD | Admitting: Family Medicine

## 2018-07-01 VITALS — BP 140/66 | HR 52 | Temp 98.1°F | Wt 161.0 lb

## 2018-07-01 DIAGNOSIS — R319 Hematuria, unspecified: Secondary | ICD-10-CM

## 2018-07-01 DIAGNOSIS — E663 Overweight: Secondary | ICD-10-CM | POA: Diagnosis not present

## 2018-07-01 LAB — POCT URINALYSIS DIPSTICK
BILIRUBIN UA: NEGATIVE
GLUCOSE UA: NEGATIVE
Ketones, UA: NEGATIVE
Leukocytes, UA: NEGATIVE
Nitrite, UA: NEGATIVE
Protein, UA: NEGATIVE
RBC UA: NEGATIVE
SPEC GRAV UA: 1.025 (ref 1.010–1.025)
UROBILINOGEN UA: 1 U/dL
pH, UA: 7 (ref 5.0–8.0)

## 2018-07-01 MED ORDER — PHENTERMINE HCL 15 MG PO CAPS: 15.0000 mg | ORAL_CAPSULE | ORAL | 0 refills | Status: DC

## 2018-07-01 NOTE — Progress Notes (Signed)
   Subjective:    Patient ID: Rebekah [name], female    DOB: 1955-06-11, 63 y.o.   MRN: [id number]  HPI  Patient is here for blood pressure and weight check. Denies trouble sleeping, palpitations, or medication problems.   Pt also comes to have urine dipstick rechecked to see if she is still having some hematuria  Review of Systems     Objective:   Physical Exam        Assessment & Plan:   Patient has lost 1 pound since last visit. States she has not been diligent on taking phentermine daily. Encouraged pt to take medication as directed by provider. Initial blood pressure reading was slightly elevated at 140/63 and after 10 minutes it was 140/66. Dr Madilyn Fireman made aware since PCP was not in office. Pt advised to watch BP over next couple days if possible and call if elevated.   A refill for phentermine has been pended. Patient advised to schedule a follow up with nurse in 30 days.   Patients dipstick also came back Negative for Blood.

## 2018-07-01 NOTE — Progress Notes (Signed)
Call pt: negative for blood. Great news.

## 2018-07-01 NOTE — Progress Notes (Signed)
Agree with documentation as above.  Did refill phentermine.  Encouraged her to continue to work diligently on diet and exercise.  In addition repeat UA was negative for blood today.  Beatrice Lecher, MD

## 2018-07-02 DIAGNOSIS — J301 Allergic rhinitis due to pollen: Secondary | ICD-10-CM | POA: Diagnosis not present

## 2018-07-02 DIAGNOSIS — J3089 Other allergic rhinitis: Secondary | ICD-10-CM | POA: Diagnosis not present

## 2018-07-17 DIAGNOSIS — J301 Allergic rhinitis due to pollen: Secondary | ICD-10-CM | POA: Diagnosis not present

## 2018-07-17 DIAGNOSIS — J3089 Other allergic rhinitis: Secondary | ICD-10-CM | POA: Diagnosis not present

## 2018-08-01 ENCOUNTER — Ambulatory Visit: Payer: BLUE CROSS/BLUE SHIELD

## 2018-08-04 DIAGNOSIS — J3089 Other allergic rhinitis: Secondary | ICD-10-CM | POA: Diagnosis not present

## 2018-08-04 DIAGNOSIS — J301 Allergic rhinitis due to pollen: Secondary | ICD-10-CM | POA: Diagnosis not present

## 2018-08-09 DIAGNOSIS — J301 Allergic rhinitis due to pollen: Secondary | ICD-10-CM | POA: Diagnosis not present

## 2018-08-09 DIAGNOSIS — J3089 Other allergic rhinitis: Secondary | ICD-10-CM | POA: Diagnosis not present

## 2018-08-13 DIAGNOSIS — J301 Allergic rhinitis due to pollen: Secondary | ICD-10-CM | POA: Diagnosis not present

## 2018-08-13 DIAGNOSIS — J3089 Other allergic rhinitis: Secondary | ICD-10-CM | POA: Diagnosis not present

## 2018-09-09 DIAGNOSIS — J301 Allergic rhinitis due to pollen: Secondary | ICD-10-CM | POA: Diagnosis not present

## 2018-09-09 DIAGNOSIS — J3089 Other allergic rhinitis: Secondary | ICD-10-CM | POA: Diagnosis not present

## 2018-10-03 ENCOUNTER — Other Ambulatory Visit: Payer: Self-pay | Admitting: Obstetrics and Gynecology

## 2018-10-03 DIAGNOSIS — Z1231 Encounter for screening mammogram for malignant neoplasm of breast: Secondary | ICD-10-CM

## 2018-10-06 DIAGNOSIS — J301 Allergic rhinitis due to pollen: Secondary | ICD-10-CM | POA: Diagnosis not present

## 2018-10-06 DIAGNOSIS — J3089 Other allergic rhinitis: Secondary | ICD-10-CM | POA: Diagnosis not present

## 2018-10-08 ENCOUNTER — Ambulatory Visit (INDEPENDENT_AMBULATORY_CARE_PROVIDER_SITE_OTHER): Payer: BLUE CROSS/BLUE SHIELD

## 2018-10-08 DIAGNOSIS — Z1231 Encounter for screening mammogram for malignant neoplasm of breast: Secondary | ICD-10-CM | POA: Diagnosis not present

## 2018-10-09 ENCOUNTER — Encounter: Payer: Self-pay | Admitting: Physician Assistant

## 2018-10-29 DIAGNOSIS — J301 Allergic rhinitis due to pollen: Secondary | ICD-10-CM | POA: Diagnosis not present

## 2018-10-29 DIAGNOSIS — J3089 Other allergic rhinitis: Secondary | ICD-10-CM | POA: Diagnosis not present

## 2018-11-01 ENCOUNTER — Encounter: Payer: Self-pay | Admitting: Emergency Medicine

## 2018-11-01 ENCOUNTER — Other Ambulatory Visit: Payer: Self-pay

## 2018-11-01 ENCOUNTER — Emergency Department (INDEPENDENT_AMBULATORY_CARE_PROVIDER_SITE_OTHER)
Admission: EM | Admit: 2018-11-01 | Discharge: 2018-11-01 | Disposition: A | Discharge status: Home / Self Care | Payer: BLUE CROSS/BLUE SHIELD | Source: Home / Self Care | Attending: Family Medicine | Admitting: Family Medicine

## 2018-11-01 DIAGNOSIS — J069 Acute upper respiratory infection, unspecified: Secondary | ICD-10-CM

## 2018-11-01 DIAGNOSIS — B9789 Other viral agents as the cause of diseases classified elsewhere: Secondary | ICD-10-CM

## 2018-11-01 MED ORDER — FLUTICASONE PROPIONATE 50 MCG/ACT NA SUSP: 2.0000 | Freq: Every day | NASAL | 2 refills | Status: DC

## 2018-11-01 MED ORDER — BENZONATATE 100 MG PO CAPS: 100.0000 mg | ORAL_CAPSULE | Freq: Three times a day (TID) | ORAL | 0 refills | Status: DC

## 2018-11-01 NOTE — ED Provider Notes (Signed)
[name]    CSN: [id number] Arrival date & time: 11/01/18  1008     History   Chief Complaint Chief Complaint  Patient presents with  . URI    sinus sx's    HPI Rebekah [name] is a 63 y.o. female.   HPI Rebekah [name] is a 63 y.o. female presenting to UC with c/o 3 days of nasal congestion, mild intermittent dry cough, scratchy throat.  Nasal drainage is clear. Bilateral ear fullness. She has taken her prescribed allergy medication as needed but she missed her allergy shot last week. She has taken Zyrtec with mild relief. Denies fever, chills, n/v/d.    Past Medical History:  Diagnosis Date  . Allergy    --takes allergy injections  . Hyperlipidemia   . Perimenopause     Patient Active Problem List   Diagnosis Date Noted  . Hematuria 05/27/2018  . Overweight (BMI 25.0-29.9) 05/27/2018  . Rhinitis, allergic 06/11/2016  . Obesity 01/11/2016  . Squamous cell carcinoma 09/21/2014  . Chronic allergic rhinitis 05/30/2014  . Elevated LDL cholesterol level 04/14/2014  . Allergic rhinitis 04/14/2014  . Seasonal allergies 04/14/2014  . OAB (overactive bladder) 04/14/2014    Past Surgical History:  Procedure Laterality Date  . BREAST SURGERY    . KNEE SURGERY Bilateral   . REFRACTIVE SURGERY Bilateral     OB History    Gravida  3   Para  2   Term  2   Preterm      AB  1   Living  1     SAB      TAB      Ectopic      Multiple      Live Births               Home Medications    Prior to Admission medications   Medication Sig Start Date End Date Taking? Authorizing Provider  azelastine (ASTELIN) 0.1 % nasal spray  12/29/14  Yes [provider]  Beclomethasone Dipropionate 80 MCG/ACT AERS Place 2 sprays into the nose daily as needed. 04/25/18  Yes Hali Marry, MD  levocetirizine (XYZAL) 5 MG tablet Take 1 tablet (5 mg total) by mouth every evening. 05/14/18  Yes Breeback, Jade L, PA-C  lovastatin (MEVACOR) 40 MG  tablet TAKE 1 TABLET (40 MG TOTAL) BY MOUTH AT BEDTIME. 02/14/18  Yes Breeback, Jade L, PA-C  montelukast (SINGULAIR) 10 MG tablet TAKE 1 TABLET (10 MG TOTAL) BY MOUTH AT BEDTIME. 04/07/18  Yes Emeterio Reeve, DO  PROAIR RESPICLICK 409 (90 BASE) MCG/ACT AEPB  01/30/15  Yes [provider]  traZODone (DESYREL) 50 MG tablet Take 0.5-1 tablets (25-50 mg total) by mouth at bedtime as needed for sleep. 02/11/15  Yes Breeback, Jade L, PA-C  benzonatate (TESSALON) 100 MG capsule Take 1-2 capsules (100-200 mg total) by mouth every 8 (eight) hours. 11/01/18   Noe Gens, PA-C  fluticasone (FLONASE) 50 MCG/ACT nasal spray Place 2 sprays into both nostrils daily. 11/01/18   Noe Gens, PA-C  ibuprofen (ADVIL,MOTRIN) 800 MG tablet Take 1 tablet (800 mg total) by mouth every 8 (eight) hours as needed. 09/22/14   Nunzio Cobbs, MD  Olopatadine HCl (PATADAY) 0.2 % SOLN Pataday 0.2 % eye drops  PLACE 1 DROP INTO BOTH EYES 2 (TWO) TIMES DAILY. 11/26/12   [provider]  phentermine 15 MG capsule Take 1 capsule (15 mg total) by mouth every morning.  07/01/18   Hali Marry, MD  UNABLE TO FIND Med Name: Allergy Shots    [provider]    Family History Family History  Problem Relation Age of Onset  . Hypertension Mother   . Stroke Mother   . Cancer Mother 55       ovarian ca, hx of TTP  . Hyperlipidemia Mother   . Ovarian cancer Mother   . Hypertension Father   . Cancer Father 41       bladder ca  . Hyperlipidemia Father   . Dementia Father        lewy body dementia  . Anemia Sister   . Diabetes Brother   . Mental illness Sister        mentally disabled--lives in group home in [place]  . Asthma Brother        allergy induced    Social History Social History   Tobacco Use  . Smoking status: Never Smoker  . Smokeless tobacco: Never Used  Substance Use Topics  . Alcohol use: Yes    Alcohol/week: 2.0 standard drinks    Types: 2 Glasses of  wine per week  . Drug use: No     Allergies   Dust mite extract and Tree extract   Review of Systems Review of Systems  Constitutional: Negative for chills and fever.  HENT: Positive for congestion, rhinorrhea and sore throat (scratchy). Negative for sinus pressure and sinus pain.   Respiratory: Positive for cough. Negative for shortness of breath and wheezing.   Neurological: Negative for dizziness, light-headedness and headaches.     Physical Exam Triage Vital Signs ED Triage Vitals  Enc Vitals Group     BP 11/01/18 1024 (!) 152/89     Pulse Rate 11/01/18 1024 63     Resp --      Temp 11/01/18 1024 98.1 F (36.7 C)     Temp Source 11/01/18 1024 Oral     SpO2 11/01/18 1024 99 %     Weight 11/01/18 1026 170 lb 6.4 oz (77.3 kg)     Height 11/01/18 1026 5\' 1"  (1.549 m)     Head Circumference --      Peak Flow --      Pain Score 11/01/18 1026 0     Pain Loc --      Pain Edu? --      Excl. in [place]? --    No data found.  Updated Vital Signs BP (!) 152/89 (BP Location: Right Arm)   Pulse 63   Temp 98.1 F (36.7 C) (Oral)   Resp 16   Ht 5\' 1"  (1.549 m)   Wt 170 lb 6.4 oz (77.3 kg)   LMP 12/31/2009 (Approximate)   SpO2 99%   BMI 32.20 kg/m   Visual Acuity Right Eye Distance:   Left Eye Distance:   Bilateral Distance:    Right Eye Near:   Left Eye Near:    Bilateral Near:     Physical Exam  Constitutional: She is oriented to person, place, and time. She appears well-developed and well-nourished.  HENT:  Head: Normocephalic and atraumatic.  Right Ear: Tympanic membrane normal.  Left Ear: Tympanic membrane normal.  Nose: Nose normal. Right sinus exhibits no maxillary sinus tenderness and no frontal sinus tenderness. Left sinus exhibits no maxillary sinus tenderness and no frontal sinus tenderness.  Mouth/Throat: Uvula is midline, oropharynx is clear and moist and mucous membranes are normal.  Eyes: EOM are normal.  Neck:  Normal range of motion. Neck supple.    Cardiovascular: Normal rate and regular rhythm.  Pulmonary/Chest: Effort normal and breath sounds normal. No stridor. No respiratory distress. She has no wheezes. She has no rales.  Musculoskeletal: Normal range of motion.  Neurological: She is alert and oriented to person, place, and time.  Skin: Skin is warm and dry.  Psychiatric: She has a normal mood and affect. Her behavior is normal.  Nursing note and vitals reviewed.    UC Treatments / Results  Labs (all labs ordered are listed, but only abnormal results are displayed) Labs Reviewed - No data to display  EKG None  Radiology No results found.  Procedures Procedures (including critical care time)  Medications Ordered in UC Medications - No data to display  Initial Impression / Assessment and Plan / UC Course  I have reviewed the triage vital signs and the nursing notes.  Pertinent labs & imaging results that were available during my care of the patient were reviewed by me and considered in my medical decision making (see chart for details).     Hx and exam c/w viral illness Encouraged symptomatic treatment F/u with PCP as needed.  Final Clinical Impressions(s) / UC Diagnoses   Final diagnoses:  Viral URI with cough   Discharge Instructions   None    ED Prescriptions    Medication Sig Dispense Auth. Provider   fluticasone (FLONASE) 50 MCG/ACT nasal spray Place 2 sprays into both nostrils daily. 16 g Reagan Klemz O, PA-C   benzonatate (TESSALON) 100 MG capsule Take 1-2 capsules (100-200 mg total) by mouth every 8 (eight) hours. 21 capsule Noe Gens, PA-C     Controlled Substance Prescriptions [place] Controlled Substance Registry consulted? Not Applicable   Tyrell Antonio 11/01/18 1451

## 2018-11-01 NOTE — ED Triage Notes (Signed)
Herewith possible URI/ Sinus sx's that started 3 dys ago. Dry, hacky cough, clear nasal drainage, and bilateral ear fullness. Taking allergy prescribed meds daily. Missed last week allergy shot. Taking Zyrtec as well.

## 2018-11-04 ENCOUNTER — Ambulatory Visit (INDEPENDENT_AMBULATORY_CARE_PROVIDER_SITE_OTHER): Payer: BLUE CROSS/BLUE SHIELD | Admitting: Physician Assistant

## 2018-11-04 ENCOUNTER — Encounter: Payer: Self-pay | Admitting: Physician Assistant

## 2018-11-04 VITALS — BP 127/70 | HR 59 | Temp 98.4°F | Ht 61.0 in | Wt 170.0 lb

## 2018-11-04 DIAGNOSIS — J329 Chronic sinusitis, unspecified: Secondary | ICD-10-CM | POA: Diagnosis not present

## 2018-11-04 DIAGNOSIS — J40 Bronchitis, not specified as acute or chronic: Secondary | ICD-10-CM

## 2018-11-04 MED ORDER — METHYLPREDNISOLONE SODIUM SUCC 125 MG IJ SOLR
125.0000 mg | Freq: Once | INTRAMUSCULAR | Status: AC
  Administered 2018-11-04: 125 mg via INTRAMUSCULAR

## 2018-11-04 MED ORDER — HYDROCOD POLST-CPM POLST ER 10-8 MG/5ML PO SUER: 5.0000 mL | Freq: Two times a day (BID) | ORAL | 0 refills | Status: DC | PRN

## 2018-11-04 MED ORDER — AZITHROMYCIN 250 MG PO TABS: ORAL_TABLET | ORAL | 0 refills | Status: DC

## 2018-11-04 NOTE — Progress Notes (Signed)
   Subjective:    Patient ID: Rebekah [name], female    DOB: [birth year], 63 y.o.   MRN: [id number]  HPI  Pt is a 63 yo female with chronic allergic rhinitis, seasonal and environmental allergies who prents to the clinic with over a week of sinus pressre, bilateral ear fullness, green to yellow sputum production. She has seen on 11/01/18 by UC and treated for a viral URI with bensonate and a nasal spray. She is not any better. infact she feels worse. She does note that she was late for her allergy shot when symptoms started. She denies any SOB or wheezing. She has not had to use her inhalers.her cough is productive. No fever, chills or body aches. Her mother is in the hospital right now and she is visiting her there a lot. She has a trip [place] and really wants to feel better.   .. Active Ambulatory Problems    Diagnosis Date Noted  . Elevated LDL cholesterol level 04/14/2014  . Allergic rhinitis 04/14/2014  . Seasonal allergies 04/14/2014  . OAB (overactive bladder) 04/14/2014  . Chronic allergic rhinitis 05/30/2014  . Squamous cell carcinoma 09/21/2014  . Obesity 01/11/2016  . Rhinitis, allergic 06/11/2016  . Hematuria 05/27/2018  . Overweight (BMI 25.0-29.9) 05/27/2018   Resolved Ambulatory Problems    Diagnosis Date Noted  . No Resolved Ambulatory Problems   Past Medical History:  Diagnosis Date  . Allergy   . Hyperlipidemia   . Perimenopause       Review of Systems  All other systems reviewed and are negative.      Objective:   Physical Exam  Constitutional: She is oriented to person, place, and time. She appears well-developed and well-nourished.  HENT:  Head: Normocephalic and atraumatic.  Right Ear: External ear normal.  Left Ear: External ear normal.  Mouth/Throat: No oropharyngeal exudate.  TM's clear.  Tenderness over maxillary sinuses to palpation.  Oropharynx erythematous with PND no tonsil swelling or exudate.  Very swollen nasal  turbinates that are very erythematous.   Eyes: Right eye exhibits no discharge. Left eye exhibits no discharge.  Injected right conjunctiva.   Neck: Normal range of motion. Neck supple.  Cardiovascular: Normal rate and regular rhythm.  Pulmonary/Chest: Effort normal and breath sounds normal. She has no wheezes.  Lymphadenopathy:    She has cervical adenopathy.  Neurological: She is alert and oriented to person, place, and time.  Skin: No rash noted.  Psychiatric: She has a normal mood and affect. Her behavior is normal.          Assessment & Plan:  Marland KitchenMarland KitchenDiagnoses and all orders for this visit:  Sinobronchitis -     azithromycin (ZITHROMAX) 250 MG tablet; Take 2 tablets now and then one tablet for 4 days. -     chlorpheniramine-HYDROcodone (TUSSIONEX PENNKINETIC ER) 10-8 MG/5ML SUER; Take 5 mLs by mouth every 12 (twelve) hours as needed for cough. -     methylPREDNISolone sodium succinate (SOLU-MEDROL) 125 mg/2 mL injection 125 mg   Likely allergy  Induced into sinobronchitis. Solumedrol 125mg  IM given today. zpak started. tussinonex for cough at bedtime. Rest and hydrate. Follow up if not improving.

## 2018-11-05 ENCOUNTER — Encounter: Payer: Self-pay | Admitting: Physician Assistant

## 2018-11-09 ENCOUNTER — Other Ambulatory Visit: Payer: Self-pay | Admitting: Physician Assistant

## 2018-11-12 DIAGNOSIS — J301 Allergic rhinitis due to pollen: Secondary | ICD-10-CM | POA: Diagnosis not present

## 2018-11-12 DIAGNOSIS — J3089 Other allergic rhinitis: Secondary | ICD-10-CM | POA: Diagnosis not present

## 2018-11-24 DIAGNOSIS — J3089 Other allergic rhinitis: Secondary | ICD-10-CM | POA: Diagnosis not present

## 2018-11-24 DIAGNOSIS — J301 Allergic rhinitis due to pollen: Secondary | ICD-10-CM | POA: Diagnosis not present

## 2018-12-04 DIAGNOSIS — J3089 Other allergic rhinitis: Secondary | ICD-10-CM | POA: Diagnosis not present

## 2018-12-04 DIAGNOSIS — J301 Allergic rhinitis due to pollen: Secondary | ICD-10-CM | POA: Diagnosis not present

## 2018-12-31 DIAGNOSIS — M8580 Other specified disorders of bone density and structure, unspecified site: Secondary | ICD-10-CM

## 2018-12-31 HISTORY — DX: Other specified disorders of bone density and structure, unspecified site: M85.80

## 2019-01-01 DIAGNOSIS — J3089 Other allergic rhinitis: Secondary | ICD-10-CM | POA: Diagnosis not present

## 2019-01-01 DIAGNOSIS — J301 Allergic rhinitis due to pollen: Secondary | ICD-10-CM | POA: Diagnosis not present

## 2019-01-06 ENCOUNTER — Encounter: Payer: Self-pay | Admitting: Family Medicine

## 2019-01-06 ENCOUNTER — Ambulatory Visit (INDEPENDENT_AMBULATORY_CARE_PROVIDER_SITE_OTHER): Payer: BLUE CROSS/BLUE SHIELD | Admitting: Family Medicine

## 2019-01-06 VITALS — BP 138/62 | HR 94 | Temp 100.2°F | Ht 61.0 in | Wt 181.0 lb

## 2019-01-06 DIAGNOSIS — R509 Fever, unspecified: Secondary | ICD-10-CM

## 2019-01-06 DIAGNOSIS — R69 Illness, unspecified: Secondary | ICD-10-CM | POA: Diagnosis not present

## 2019-01-06 DIAGNOSIS — J111 Influenza due to unidentified influenza virus with other respiratory manifestations: Secondary | ICD-10-CM

## 2019-01-06 LAB — POCT INFLUENZA A/B
INFLUENZA A, POC: NEGATIVE
INFLUENZA B, POC: NEGATIVE

## 2019-01-06 MED ORDER — OSELTAMIVIR PHOSPHATE 75 MG PO CAPS: 75.0000 mg | ORAL_CAPSULE | Freq: Two times a day (BID) | ORAL | 0 refills | Status: DC

## 2019-01-06 NOTE — Progress Notes (Signed)
Acute Office Visit  Subjective:    Patient ID: Rebekah [name], female    DOB: 11-07-1955, 64 y.o.   MRN: [id number]  Chief Complaint  Patient presents with  . Fever    x 1 day temp was 102 @ 10 AM  she has been taking Alka seltzer cold cough and mucous  . Cough  . Chills    HPI Patient is in today for body aches and chills and fever  And cough x 1 days.  Temp was 102 at 10 AM today.  Using a serous or cold and cough.  Throat only with cough.  Mostly clear runny nose.  She was able to wear her CPAP last night.  Past Medical History:  Diagnosis Date  . Allergy    --takes allergy injections  . Hyperlipidemia   . Perimenopause     Past Surgical History:  Procedure Laterality Date  . BREAST SURGERY    . KNEE SURGERY Bilateral   . REFRACTIVE SURGERY Bilateral     Family History  Problem Relation Age of Onset  . Hypertension Mother   . Stroke Mother   . Cancer Mother 51       ovarian ca, hx of TTP  . Hyperlipidemia Mother   . Ovarian cancer Mother   . Hypertension Father   . Cancer Father 74       bladder ca  . Hyperlipidemia Father   . Dementia Father        lewy body dementia  . Anemia Sister   . Diabetes Brother   . Mental illness Sister        mentally disabled--lives in group home in [place]  . Asthma Brother        allergy induced    Social History   Socioeconomic History  . Marital status: Widowed    Spouse name: Not on file  . Number of children: 2  . Years of education: Not on file  . Highest education level: Not on file  Occupational History  . Not on file  Social Needs  . Financial resource strain: Not on file  . Food insecurity:    Worry: Not on file    Inability: Not on file  . Transportation needs:    Medical: Not on file    Non-medical: Not on file  Tobacco Use  . Smoking status: Never Smoker  . Smokeless tobacco: Never Used  Substance and Sexual Activity  . Alcohol use: Yes    Alcohol/week: 2.0 standard drinks    Types: 2  Glasses of wine per week  . Drug use: No  . Sexual activity: Never    Partners: Male    Birth control/protection: Post-menopausal  Lifestyle  . Physical activity:    Days per week: Not on file    Minutes per session: Not on file  . Stress: Not on file  Relationships  . Social connections:    Talks on phone: Not on file    Gets together: Not on file    Attends religious service: Not on file    Active member of club or organization: Not on file    Attends meetings of clubs or organizations: Not on file    Relationship status: Not on file  . Intimate partner violence:    Fear of current or ex partner: Not on file    Emotionally abused: Not on file    Physically abused: Not on file    Forced sexual activity: Not on file  Other Topics Concern  . Not on file  Social History Narrative  . Not on file    Outpatient Medications Prior to Visit  Medication Sig Dispense Refill  . azelastine (ASTELIN) 0.1 % nasal spray   5  . fluticasone (FLONASE) 50 MCG/ACT nasal spray Place 2 sprays into both nostrils daily. 16 g 2  . levocetirizine (XYZAL) 5 MG tablet Take 1 tablet (5 mg total) by mouth every evening. 90 tablet 4  . lovastatin (MEVACOR) 40 MG tablet TAKE 1 TABLET (40 MG TOTAL) BY MOUTH AT BEDTIME. 90 tablet 1  . montelukast (SINGULAIR) 10 MG tablet TAKE 1 TABLET (10 MG TOTAL) BY MOUTH AT BEDTIME. 90 tablet 4  . PROAIR RESPICLICK 106 (90 BASE) MCG/ACT AEPB   3  . traZODone (DESYREL) 50 MG tablet Take 0.5-1 tablets (25-50 mg total) by mouth at bedtime as needed for sleep. 30 tablet 1  . UNABLE TO FIND Med Name: Allergy Shots    . azithromycin (ZITHROMAX) 250 MG tablet Take 2 tablets now and then one tablet for 4 days. 6 tablet 0  . Beclomethasone Dipropionate 80 MCG/ACT AERS Place 2 sprays into the nose daily as needed. 26.1 g 1  . chlorpheniramine-HYDROcodone (TUSSIONEX PENNKINETIC ER) 10-8 MG/5ML SUER Take 5 mLs by mouth every 12 (twelve) hours as needed for cough. 50 mL 0  . ibuprofen  (ADVIL,MOTRIN) 800 MG tablet Take 1 tablet (800 mg total) by mouth every 8 (eight) hours as needed. 30 tablet 0  . Olopatadine HCl (PATADAY) 0.2 % SOLN Pataday 0.2 % eye drops  PLACE 1 DROP INTO BOTH EYES 2 (TWO) TIMES DAILY.    Marland Kitchen phentermine 15 MG capsule Take 1 capsule (15 mg total) by mouth every morning. 30 capsule 0   No facility-administered medications prior to visit.     Allergies  Allergen Reactions  . Dust Mite Extract Other (See Comments)    Patient taking shots for dust mite and trees  . Tree Extract     ROS     Objective:    Physical Exam  Constitutional: She is oriented to person, place, and time. She appears well-developed and well-nourished.  HENT:  Head: Normocephalic and atraumatic.  Right Ear: External ear normal.  Left Ear: External ear normal.  Nose: Nose normal.  Mouth/Throat: Oropharynx is clear and moist.  TMs and canals are clear.   Eyes: Pupils are equal, round, and reactive to light. Conjunctivae and EOM are normal.  Neck: Neck supple. No thyromegaly present.  Cardiovascular: Normal rate, regular rhythm and normal heart sounds.  Pulmonary/Chest: Effort normal and breath sounds normal. She has no wheezes.  Lymphadenopathy:    She has no cervical adenopathy.  Neurological: She is alert and oriented to person, place, and time.  Skin: Skin is warm and dry.  Psychiatric: She has a normal mood and affect.    BP 138/62   Pulse 94   Temp 100.2 F (37.9 C) (Oral)   Ht 5\' 1"  (1.549 m)   Wt 181 lb (82.1 kg)   LMP 12/31/2009 (Approximate)   SpO2 99%   BMI 34.20 kg/m  Wt Readings from Last 3 Encounters:  01/06/19 181 lb (82.1 kg)  11/04/18 170 lb (77.1 kg)  11/01/18 170 lb 6.4 oz (77.3 kg)    There are no preventive care reminders to display for this patient.  There are no preventive care reminders to display for this patient.   Lab Results  Component Value Date   TSH 1.22 10/28/2017  Lab Results  Component Value Date   WBC 7.0  02/08/2015   HGB 14.0 02/08/2015   HCT 42.7 02/08/2015   MCV 86.6 02/08/2015   PLT 202 02/08/2015   Lab Results  Component Value Date   NA 142 10/28/2017   K 4.0 10/28/2017   CO2 27 10/28/2017   GLUCOSE 94 10/28/2017   BUN 12 10/28/2017   CREATININE 0.70 10/28/2017   BILITOT 0.4 10/28/2017   ALKPHOS 59 01/11/2016   AST 20 10/28/2017   ALT 20 10/28/2017   PROT 7.1 10/28/2017   ALBUMIN 4.2 01/11/2016   CALCIUM 9.0 10/28/2017   Lab Results  Component Value Date   CHOL 190 10/28/2017   Lab Results  Component Value Date   HDL 56 10/28/2017   Lab Results  Component Value Date   LDLCALC 114 (H) 10/28/2017   Lab Results  Component Value Date   TRIG 97 10/28/2017   Lab Results  Component Value Date   CHOLHDL 3.4 10/28/2017   No results found for: HGBA1C     Assessment & Plan:   Problem List Items Addressed This Visit    None    Visit Diagnoses    Fever, unspecified fever cause    -  Primary   Relevant Orders   POCT Influenza A/B (Completed)   Influenza-like illness         Will tx with tamiflu. Pos exposure. Call if not improving or getting worse.  Hydrate well.   Meds ordered this encounter  Medications  . oseltamivir (TAMIFLU) 75 MG capsule    Sig: Take 1 capsule (75 mg total) by mouth 2 (two) times daily.    Dispense:  10 capsule    Refill:  0     Beatrice Lecher, MD

## 2019-01-06 NOTE — Patient Instructions (Signed)
Fever, Adult         A fever is an increase in your body's temperature. It often means a temperature of 100.4F (38C) or higher. Brief mild or moderate fevers often have no long-term effects. They often do not need treatment. Moderate or high fevers may make you feel uncomfortable. Sometimes, they can be a sign of a serious illness or disease. A fever that keeps coming back or that lasts a long time may cause you to lose water in your body (get dehydrated).  You can take your temperature with a thermometer to see if you have a fever. Temperature can change with:   Age.   Time of day.   Where the thermometer is put in the body. Readings may vary when the thermometer is put:  ? In the mouth (oral).  ? In the butt (rectal).  ? In the ear (tympanic).  ? Under the arm (axillary).  ? On the forehead (temporal).  Follow these instructions at home:  Medicines   Take over-the-counter and prescription medicines only as told by your doctor. Follow the dosing instructions carefully.   If you were prescribed an antibiotic medicine, take it as told by your doctor. Do not stop taking it even if you start to feel better.  General instructions   Watch for any changes in your symptoms. Tell your doctor about them.   Rest as needed.   Drink enough fluid to keep your pee (urine) pale yellow.   Sponge yourself or bathe with room-temperature water as needed. This helps to lower your body temperature. Do not use ice water.   Do not use too many blankets or wear clothes that are too heavy.   If your fever was caused by an infection that spreads from person to person (is contagious), such as a cold or the flu:  ? You should stay home from work and public places for at least 24 hours after your fever is gone.  ? Your fever should be gone for at least 24 hours without the need to use medicines.  Contact a doctor if:   You throw up (vomit).   You cannot eat or drink without throwing up.   You have watery poop (diarrhea).   It  hurts when you pee.   Your symptoms do not get better with treatment.   You have new symptoms.   You feel very weak.  Get help right away if:   You are short of breath or have trouble breathing.   You are dizzy or you pass out (faint).   You feel mixed up (confused).   You have signs of not having enough water in your body, such as:  ? Dark pee, very little pee, or no pee.  ? Cracked lips.  ? Dry mouth.  ? Sunken eyes.  ? Sleepiness.  ? Weakness.   You have very bad pain in your belly (abdomen).   You keep throwing up or having watery poop.   You have a rash on your skin.   Your symptoms get worse all of a sudden.  Summary   A fever is an increase in your body's temperature. It often means a temperature of 100.4F (38C) or higher.   Watch for any changes in your symptoms. Tell your doctor about them.   Take all medicines only as told by your doctor.   Do not go to work or other public places if your fever was caused by an illness that can spread   to other people.   Get help right away if you have signs that you do not have enough water in your body.  This information is not intended to replace advice given to you by your health care provider. Make sure you discuss any questions you have with your health care provider.  Document Released: 09/25/2008 Document Revised: 06/02/2018 Document Reviewed: 06/02/2018  Elsevier Interactive Patient Education  2019 [employer]

## 2019-01-09 ENCOUNTER — Encounter: Payer: Self-pay | Admitting: Family Medicine

## 2019-01-22 ENCOUNTER — Encounter: Payer: Self-pay | Admitting: Physician Assistant

## 2019-01-26 DIAGNOSIS — J3089 Other allergic rhinitis: Secondary | ICD-10-CM | POA: Diagnosis not present

## 2019-01-26 DIAGNOSIS — J301 Allergic rhinitis due to pollen: Secondary | ICD-10-CM | POA: Diagnosis not present

## 2019-01-28 ENCOUNTER — Encounter: Payer: Self-pay | Admitting: Physician Assistant

## 2019-01-28 ENCOUNTER — Ambulatory Visit (INDEPENDENT_AMBULATORY_CARE_PROVIDER_SITE_OTHER): Payer: BLUE CROSS/BLUE SHIELD | Admitting: Physician Assistant

## 2019-01-28 DIAGNOSIS — K2100 Gastro-esophageal reflux disease with esophagitis, without bleeding: Secondary | ICD-10-CM | POA: Insufficient documentation

## 2019-01-28 DIAGNOSIS — R011 Cardiac murmur, unspecified: Secondary | ICD-10-CM | POA: Diagnosis not present

## 2019-01-28 DIAGNOSIS — R1013 Epigastric pain: Secondary | ICD-10-CM | POA: Insufficient documentation

## 2019-01-28 DIAGNOSIS — R001 Bradycardia, unspecified: Secondary | ICD-10-CM | POA: Insufficient documentation

## 2019-01-28 DIAGNOSIS — R079 Chest pain, unspecified: Secondary | ICD-10-CM | POA: Insufficient documentation

## 2019-01-28 MED ORDER — OMEPRAZOLE 40 MG PO CPDR: 40.0000 mg | DELAYED_RELEASE_CAPSULE | Freq: Every day | ORAL | 1 refills | Status: DC

## 2019-01-28 MED ORDER — SUCRALFATE 1 G PO TABS: 1.0000 g | ORAL_TABLET | Freq: Three times a day (TID) | ORAL | 0 refills | Status: DC

## 2019-01-28 NOTE — Patient Instructions (Signed)
Gastritis, Adult    Gastritis is swelling (inflammation) of the stomach. Gastritis can develop quickly (acute). It can also develop slowly over time (chronic). It is important to get help for this condition. If you do not get help, your stomach can bleed, and you can get sores (ulcers) in your stomach.  What are the causes?  This condition may be caused by:   Germs that get to your stomach.   Drinking too much alcohol.   Medicines you are taking.   Too much acid in the stomach.   A disease of the intestines or stomach.   Stress.   An allergic reaction.   Crohn's disease.   Some cancer treatments (radiation).  Sometimes the cause of this condition is not known.  What are the signs or symptoms?  Symptoms of this condition include:   Pain in your stomach.   A burning feeling in your stomach.   Feeling sick to your stomach (nauseous).   Throwing up (vomiting).   Feeling too full after you eat.   Weight loss.   Bad breath.   Throwing up blood.   Blood in your poop (stool).  How is this diagnosed?  This condition may be diagnosed with:   Your medical history and symptoms.   A physical exam.   Tests. These can include:  ? Blood tests.  ? Stool tests.  ? A procedure to look inside your stomach (upper endoscopy).  ? A test in which a sample of tissue is taken for testing (biopsy).  How is this treated?  Treatment for this condition depends on what caused it. You may be given:   Antibiotic medicine, if your condition was caused by germs.   H2 blockers and similar medicines, if your condition was caused by too much acid.  Follow these instructions at home:  Medicines   Take over-the-counter and prescription medicines only as told by your doctor.   If you were prescribed an antibiotic medicine, take it as told by your doctor. Do not stop taking it even if you start to feel better.  Eating and drinking     Eat small meals often, instead of large meals.   Avoid foods and drinks that make your symptoms  worse.   Drink enough fluid to keep your pee (urine) pale yellow.  Alcohol use   Do not drink alcohol if:  ? Your doctor tells you not to drink.  ? You are pregnant, may be pregnant, or are planning to become pregnant.   If you drink alcohol:  ? Limit your use to:   0-1 drink a day for women.   0-2 drinks a day for men.  ? Be aware of how much alcohol is in your drink. In the [place] one drink equals one 12 oz bottle of beer (355 mL), one 5 oz glass of wine (148 mL), or one 1 oz glass of hard liquor (44 mL).  General instructions   Talk with your doctor about ways to manage stress. You can exercise or do deep breathing, meditation, or yoga.   Do not smoke or use products that have nicotine or tobacco. If you need help quitting, ask your doctor.   Keep all follow-up visits as told by your doctor. This is important.  Contact a doctor if:   Your symptoms get worse.   Your symptoms go away and then come back.  Get help right away if:   You throw up blood or something that looks like coffee   grounds.   You have black or dark red poop.   You throw up any time you try to drink fluids.   Your stomach pain gets worse.   You have a fever.   You do not feel better after one week.  Summary   Gastritis is swelling (inflammation) of the stomach.   You must get help for this condition. If you do not get help, your stomach can bleed, and you can get sores (ulcers).   This condition is diagnosed with medical history, physical exam, or tests.   You can be treated with medicines for germs or medicines to block too much acid in your stomach.  This information is not intended to replace advice given to you by your health care provider. Make sure you discuss any questions you have with your health care provider.  Document Released: 06/04/2008 Document Revised: 05/06/2018 Document Reviewed: 05/06/2018  Elsevier Interactive Patient Education  2019 [employer]

## 2019-01-28 NOTE — Progress Notes (Signed)
Subjective:    Patient ID: Rebekah [name], female    DOB: 1955/07/13, 64 y.o.   MRN: [id number]  HPI: This is a 64 year old female who presents to the clinic today with a chief complaint of abnormal feelings in her chest. She is worried that this may be flu related as she had a family member pass away from the flu. She did have flu like symptoms back in early January. She denies body aches, fever, cough, sore throat. She states that the feeling in her chest has been intermittently present for 2-3 weeks and sometimes occurs after she eats. She is also burping and nauseous. She denies a history of GERD or use of acid reducers. She denies swelling, palpitations, shortness of breath, or exertional ShOB or CP.   .. Active Ambulatory Problems    Diagnosis Date Noted  . Elevated LDL cholesterol level 04/14/2014  . Allergic rhinitis 04/14/2014  . Seasonal allergies 04/14/2014  . OAB (overactive bladder) 04/14/2014  . Chronic allergic rhinitis 05/30/2014  . Squamous cell carcinoma 09/21/2014  . Obesity 01/11/2016  . Rhinitis, allergic 06/11/2016  . Hematuria 05/27/2018  . Overweight (BMI 25.0-29.9) 05/27/2018   Resolved Ambulatory Problems    Diagnosis Date Noted  . No Resolved Ambulatory Problems   Past Medical History:  Diagnosis Date  . Allergy   . Hyperlipidemia   . Perimenopause        Review of Systems  Constitutional: Positive for diaphoresis. Negative for chills, fatigue and fever.  HENT: Negative for congestion and sore throat.   Respiratory: Positive for chest tightness. Negative for cough and shortness of breath.   Cardiovascular: Negative for chest pain and palpitations.  Gastrointestinal: Positive for nausea. Negative for diarrhea and vomiting.  Musculoskeletal: Negative for arthralgias and myalgias.       Objective:   Physical Exam Vitals signs reviewed.  Constitutional:      General: She is not in acute distress.    Appearance: She is diaphoretic. She is not  ill-appearing.  HENT:     Head: Normocephalic and atraumatic.     Right Ear: Tympanic membrane, ear canal and external ear normal. There is no impacted cerumen.     Left Ear: Tympanic membrane, ear canal and external ear normal. There is no impacted cerumen.     Nose: Nose normal. No congestion or rhinorrhea.     Mouth/Throat:     Mouth: Mucous membranes are moist.     Pharynx: Oropharynx is clear. No oropharyngeal exudate or posterior oropharyngeal erythema.  Eyes:     General: No scleral icterus.       Right eye: No discharge.        Left eye: No discharge.     Extraocular Movements: Extraocular movements intact.     Conjunctiva/sclera: Conjunctivae normal.     Pupils: Pupils are equal, round, and reactive to light.  Neck:     Musculoskeletal: Normal range of motion and neck supple. No neck rigidity or muscular tenderness.  Cardiovascular:     Rate and Rhythm: Regular rhythm. Bradycardia present.     Pulses: Normal pulses.     Heart sounds: Murmur present.     Comments: New murmur 2/6.  Pulmonary:     Effort: Pulmonary effort is normal. No respiratory distress.     Breath sounds: Normal breath sounds. No wheezing, rhonchi or rales.  Abdominal:     General: Abdomen is flat. Bowel sounds are normal. There is no distension.  Tenderness: There is abdominal tenderness. There is no guarding.     Comments: Some epigastric tenderness. No guarding or rebound.   Lymphadenopathy:     Cervical: No cervical adenopathy.  Skin:    General: Skin is warm.     Capillary Refill: Capillary refill takes less than 2 seconds.     Coloration: Skin is not jaundiced.     Findings: No bruising or rash.  Neurological:     General: No focal deficit present.     Mental Status: She is alert and oriented to person, place, and time.     Coordination: Coordination normal.     Gait: Gait normal.  Psychiatric:        Mood and Affect: Mood normal.        Behavior: Behavior normal.        Thought  Content: Thought content normal.           Assessment & Plan:  Marland KitchenMarland KitchenDiagnoses and all orders for this visit:  Epigastric pain -     COMPLETE METABOLIC PANEL WITH GFR -     CBC with Differential/Platelet -     Lipase -     EKG 12-Lead -     omeprazole (PRILOSEC) 40 MG capsule; Take 1 capsule (40 mg total) by mouth daily. -     sucralfate (CARAFATE) 1 g tablet; Take 1 tablet (1 g total) by mouth 3 (three) times daily. -     H. pylori breath test  Gastroesophageal reflux disease with esophagitis -     omeprazole (PRILOSEC) 40 MG capsule; Take 1 capsule (40 mg total) by mouth daily. -     sucralfate (CARAFATE) 1 g tablet; Take 1 tablet (1 g total) by mouth 3 (three) times daily. -     H. pylori breath test  Chest pain, unspecified type -     omeprazole (PRILOSEC) 40 MG capsule; Take 1 capsule (40 mg total) by mouth daily. -     sucralfate (CARAFATE) 1 g tablet; Take 1 tablet (1 g total) by mouth 3 (three) times daily.  Newly recognized heart murmur -     ECHOCARDIOGRAM COMPLETE; Future -     ECHOCARDIOGRAM COMPLETE  Bradycardia   I did hear a new murmur today will get echo to evaluate. No concerning SOB or edema.   EKG- sinus brady with arrthymia at 54. Inverted T waves in leads AvR, V1, V2. No ST elevation or depression.   Pt has a hx of bradycardia. She does not feel dizzy.   Suspect some gastritis. CMP, CBC, lipase ordered today. H.pylori breath test ordered today. We were out of GI cocktails. Will start PPI and carafate for the next 4-6 weeks. HO given for gastritis. If no improvement please let us know. I do not suspect anything cardiac causing symptoms but if not improving will consider stress test.   Follow up in 4 weeks.   Marland KitchenVernetta Honey PA-C, have reviewed and agree with the above documentation in it's entirety.

## 2019-01-29 LAB — H. PYLORI BREATH TEST: H. pylori Breath Test: NOT DETECTED

## 2019-01-30 NOTE — Progress Notes (Signed)
Call pt: negative for h.pylori. keep treatment plan same as discussed in office. If not feeling any better in next week let me know.

## 2019-02-03 ENCOUNTER — Encounter: Payer: Self-pay | Admitting: Physician Assistant

## 2019-02-05 DIAGNOSIS — J3089 Other allergic rhinitis: Secondary | ICD-10-CM | POA: Diagnosis not present

## 2019-02-05 DIAGNOSIS — J301 Allergic rhinitis due to pollen: Secondary | ICD-10-CM | POA: Diagnosis not present

## 2019-02-08 DIAGNOSIS — Z20828 Contact with and (suspected) exposure to other viral communicable diseases: Secondary | ICD-10-CM | POA: Diagnosis not present

## 2019-02-08 DIAGNOSIS — R05 Cough: Secondary | ICD-10-CM | POA: Diagnosis not present

## 2019-02-08 DIAGNOSIS — J069 Acute upper respiratory infection, unspecified: Secondary | ICD-10-CM | POA: Diagnosis not present

## 2019-02-08 DIAGNOSIS — E7800 Pure hypercholesterolemia, unspecified: Secondary | ICD-10-CM | POA: Diagnosis not present

## 2019-02-09 DIAGNOSIS — J3089 Other allergic rhinitis: Secondary | ICD-10-CM | POA: Diagnosis not present

## 2019-02-09 DIAGNOSIS — J301 Allergic rhinitis due to pollen: Secondary | ICD-10-CM | POA: Diagnosis not present

## 2019-02-11 ENCOUNTER — Ambulatory Visit (HOSPITAL_BASED_OUTPATIENT_CLINIC_OR_DEPARTMENT_OTHER)
Admission: RE | Admit: 2019-02-11 | Discharge: 2019-02-11 | Disposition: A | Discharge status: Home / Self Care | Payer: BLUE CROSS/BLUE SHIELD | Source: Ambulatory Visit | Attending: Physician Assistant | Admitting: Physician Assistant

## 2019-02-11 DIAGNOSIS — R011 Cardiac murmur, unspecified: Secondary | ICD-10-CM | POA: Insufficient documentation

## 2019-02-11 NOTE — Progress Notes (Signed)
  Echocardiogram 2D Echocardiogram has been performed.  Lariya Kinzie T Christian Borgerding 02/11/2019, 1:55 PM

## 2019-02-13 NOTE — Progress Notes (Signed)
Call pt: overall echo looks good and nothing to interviene with but some things to monitor if you were to become symptomatic.   Systolic function is normal.   You have some trivial regurgitation at mitral and tricuspid valves.   You have a mildly dilated right atrium.   You do have some slight diatolic dysfunction.   You have a small amt of fluid just behind your heart. This can be normal but not always commented on. Any shortness of breath or swelling?

## 2019-02-19 ENCOUNTER — Other Ambulatory Visit: Payer: Self-pay | Admitting: Physician Assistant

## 2019-02-19 DIAGNOSIS — R1013 Epigastric pain: Secondary | ICD-10-CM

## 2019-02-19 DIAGNOSIS — K2100 Gastro-esophageal reflux disease with esophagitis, without bleeding: Secondary | ICD-10-CM

## 2019-02-19 DIAGNOSIS — R079 Chest pain, unspecified: Secondary | ICD-10-CM

## 2019-03-05 DIAGNOSIS — J301 Allergic rhinitis due to pollen: Secondary | ICD-10-CM | POA: Diagnosis not present

## 2019-03-05 DIAGNOSIS — J3089 Other allergic rhinitis: Secondary | ICD-10-CM | POA: Diagnosis not present

## 2019-03-19 ENCOUNTER — Ambulatory Visit: Payer: BLUE CROSS/BLUE SHIELD | Admitting: Obstetrics and Gynecology

## 2019-03-19 DIAGNOSIS — J301 Allergic rhinitis due to pollen: Secondary | ICD-10-CM | POA: Diagnosis not present

## 2019-03-19 DIAGNOSIS — J3089 Other allergic rhinitis: Secondary | ICD-10-CM | POA: Diagnosis not present

## 2019-03-20 ENCOUNTER — Other Ambulatory Visit: Payer: Self-pay | Admitting: Physician Assistant

## 2019-03-20 DIAGNOSIS — K2100 Gastro-esophageal reflux disease with esophagitis, without bleeding: Secondary | ICD-10-CM

## 2019-03-20 DIAGNOSIS — R1013 Epigastric pain: Secondary | ICD-10-CM

## 2019-03-20 DIAGNOSIS — R079 Chest pain, unspecified: Secondary | ICD-10-CM

## 2019-03-25 ENCOUNTER — Encounter: Payer: Self-pay | Admitting: Physician Assistant

## 2019-03-26 ENCOUNTER — Ambulatory Visit: Payer: BLUE CROSS/BLUE SHIELD | Admitting: Obstetrics and Gynecology

## 2019-03-30 ENCOUNTER — Other Ambulatory Visit: Payer: Self-pay | Admitting: Physician Assistant

## 2019-03-30 DIAGNOSIS — R079 Chest pain, unspecified: Secondary | ICD-10-CM

## 2019-03-30 DIAGNOSIS — K2100 Gastro-esophageal reflux disease with esophagitis, without bleeding: Secondary | ICD-10-CM

## 2019-03-30 DIAGNOSIS — R1013 Epigastric pain: Secondary | ICD-10-CM

## 2019-04-09 DIAGNOSIS — J301 Allergic rhinitis due to pollen: Secondary | ICD-10-CM | POA: Diagnosis not present

## 2019-04-09 DIAGNOSIS — J3089 Other allergic rhinitis: Secondary | ICD-10-CM | POA: Diagnosis not present

## 2019-04-13 ENCOUNTER — Other Ambulatory Visit: Payer: Self-pay | Admitting: Physician Assistant

## 2019-04-13 DIAGNOSIS — K2100 Gastro-esophageal reflux disease with esophagitis, without bleeding: Secondary | ICD-10-CM

## 2019-04-13 DIAGNOSIS — R1013 Epigastric pain: Secondary | ICD-10-CM

## 2019-04-13 DIAGNOSIS — R079 Chest pain, unspecified: Secondary | ICD-10-CM

## 2019-04-13 NOTE — Telephone Encounter (Signed)
Spoke with patient. Stomach symptoms have improved. She is no longer taking Carafate. Refill denied.

## 2019-04-14 ENCOUNTER — Other Ambulatory Visit: Payer: Self-pay | Admitting: Physician Assistant

## 2019-04-14 DIAGNOSIS — K2100 Gastro-esophageal reflux disease with esophagitis, without bleeding: Secondary | ICD-10-CM

## 2019-04-14 DIAGNOSIS — R079 Chest pain, unspecified: Secondary | ICD-10-CM

## 2019-04-14 DIAGNOSIS — R1013 Epigastric pain: Secondary | ICD-10-CM

## 2019-04-30 DIAGNOSIS — J3089 Other allergic rhinitis: Secondary | ICD-10-CM | POA: Diagnosis not present

## 2019-04-30 DIAGNOSIS — J301 Allergic rhinitis due to pollen: Secondary | ICD-10-CM | POA: Diagnosis not present

## 2019-05-14 DIAGNOSIS — R05 Cough: Secondary | ICD-10-CM | POA: Diagnosis not present

## 2019-05-14 DIAGNOSIS — J3089 Other allergic rhinitis: Secondary | ICD-10-CM | POA: Diagnosis not present

## 2019-05-14 DIAGNOSIS — J301 Allergic rhinitis due to pollen: Secondary | ICD-10-CM | POA: Diagnosis not present

## 2019-05-18 ENCOUNTER — Other Ambulatory Visit: Payer: Self-pay | Admitting: Physician Assistant

## 2019-05-19 ENCOUNTER — Other Ambulatory Visit: Payer: Self-pay | Admitting: Osteopathic Medicine

## 2019-05-21 DIAGNOSIS — J3089 Other allergic rhinitis: Secondary | ICD-10-CM | POA: Diagnosis not present

## 2019-05-21 DIAGNOSIS — J301 Allergic rhinitis due to pollen: Secondary | ICD-10-CM | POA: Diagnosis not present

## 2019-05-28 ENCOUNTER — Encounter: Payer: Self-pay | Admitting: Obstetrics and Gynecology

## 2019-05-28 ENCOUNTER — Telehealth: Payer: Self-pay | Admitting: Obstetrics and Gynecology

## 2019-05-28 NOTE — Telephone Encounter (Signed)
Spoke with patient. Patient request to update medication list that she started by MyChart in preparation for 5/29 OV. Med list updated to include OTC cetirizine hydrocholoride 10 mg tab po daily prn for allergies. Medication list updated. Questions answered.   Encounter closed.

## 2019-05-28 NOTE — Progress Notes (Signed)
64 y.o. G23P2011 Widowed Caucasian female here for annual exam.    No problems.  Doing intentional weight loss.  Has fecal urgency with bowel movements.  Taking Metamucil.  Doing ok during the pandemic.  Working in [place], which is now closer to home.  She is working at the office.  Father in nursing home, Lewy Body dementia.  Mother passed away last year from recurrent ovarian cancer.  She fell and broke her hip. Thinks she had negative genetic testing.   Labs with PCP.   PCP: Royetta Car. Breeback, PA-C    Patient's last menstrual period was 12/31/2009 (approximate).           Sexually active: No.  The current method of family planning is post menopausal status.    Exercising: Yes.    walking, yoga Smoker:  no  Health Maintenance: Pap:  03/06/17 pap and HR HPV negative History of abnormal Pap:  GNF,6213 colposcopy/cryotherapy to cervix. Paps normal since MMG:  10/08/18 BIRADS 1 negative/density b Colonoscopy:  01-25-16 tubular adenomatous with [facility] in [place] due 12/2020. BMD:   n/a  Result  n/a TDaP:  2016 Gardasil:   n/a HIV and Hep C: 01/11/16 Negative Screening Labs: PCP   reports that she has never smoked. She has never used smokeless tobacco. She reports current alcohol use of about 2.0 standard drinks of alcohol per week. She reports that she does not use drugs.  Past Medical History:  Diagnosis Date  . Allergy    --takes allergy injections  . Hyperlipidemia   . Perimenopause     Past Surgical History:  Procedure Laterality Date  . BREAST SURGERY    . KNEE SURGERY Bilateral   . REFRACTIVE SURGERY Bilateral     Current Outpatient Medications  Medication Sig Dispense Refill  . azelastine (ASTELIN) 0.1 % nasal spray   5  . cetirizine (ZYRTEC) 10 MG tablet Take 10 mg by mouth daily as needed for allergies. Patient reported    . fluticasone (FLONASE) 50 MCG/ACT nasal spray Place 2 sprays into both nostrils daily. 16 g 2  .  levocetirizine (XYZAL) 5 MG tablet Take 1 tablet (5 mg total) by mouth every evening. 90 tablet 4  . lovastatin (MEVACOR) 40 MG tablet TAKE 1 TABLET (40 MG TOTAL) BY MOUTH AT BEDTIME. 90 tablet 1  . montelukast (SINGULAIR) 10 MG tablet TAKE 1 TABLET BY MOUTH EVERYDAY AT BEDTIME 90 tablet 0  . PROAIR RESPICLICK 086 (90 BASE) MCG/ACT AEPB   3  . traZODone (DESYREL) 50 MG tablet Take 0.5-1 tablets (25-50 mg total) by mouth at bedtime as needed for sleep. 30 tablet 1  . UNABLE TO FIND Med Name: Allergy Shots     No current facility-administered medications for this visit.     Family History  Problem Relation Age of Onset  . Hypertension Mother   . Stroke Mother   . Cancer Mother 54       ovarian ca, hx of TTP  . Hyperlipidemia Mother   . Ovarian cancer Mother   . Hypertension Father   . Cancer Father 24       bladder ca  . Hyperlipidemia Father   . Dementia Father        lewy body dementia  . Anemia Sister   . Diabetes Brother   . Mental illness Sister        mentally disabled--lives in group home in [place]  . Asthma Brother        allergy  induced    Review of Systems  Constitutional: Negative.   HENT: Negative.   Eyes: Negative.   Respiratory: Negative.   Cardiovascular: Negative.   Gastrointestinal: Negative.   Endocrine: Negative.   Genitourinary: Negative.   Musculoskeletal: Negative.   Skin: Negative.   Allergic/Immunologic: Negative.   Neurological: Negative.   Hematological: Negative.   Psychiatric/Behavioral: Negative.     Exam:   BP 116/78 (BP Location: Left Arm, Patient Position: Sitting, Cuff Size: Large)   Pulse 68   Temp 97.6 F (36.4 C) (Temporal)   Resp 12   Ht 5' 0.5" (1.537 m)   Wt 170 lb (77.1 kg)   LMP 12/31/2009 (Approximate)   BMI 32.65 kg/m     General appearance: alert, cooperative and appears stated age Head: Normocephalic, without obvious abnormality, atraumatic Neck: no adenopathy, supple, symmetrical, trachea midline and thyroid  normal to inspection and palpation Lungs: clear to auscultation bilaterally Breasts: consistent with bilateral reduction, no masses or tenderness, No nipple retraction or dimpling, No nipple discharge or bleeding, No axillary or supraclavicular adenopathy Heart: regular rate and rhythm Abdomen: soft, non-tender; no masses, no organomegaly Extremities: extremities normal, atraumatic, no cyanosis or edema Skin: Skin color, texture, turgor normal. No rashes or lesions Lymph nodes: Cervical, supraclavicular, and axillary nodes normal. No abnormal inguinal nodes palpated Neurologic: Grossly normal  Pelvic: External genitalia:  no lesions              Urethra:  normal appearing urethra with no masses, tenderness or lesions              Bartholins and Skenes: normal                 Vagina: normal appearing vagina with normal color and discharge, no lesions              Cervix: no lesions              Pap taken: No. Bimanual Exam:  Uterus:  normal size, contour, position, consistency, mobility, non-tender              Adnexa: no mass, fullness, tenderness              Rectal exam: Yes.  .  Confirms.              Anus:  normal sphincter tone, no lesions  Chaperone was present for exam.  Assessment:   Well woman visit with normal exam. FH ovarian cancer in mother. Patient with normal CT and ultrasound in 2015. Status post bilateral breast reduction.  Plan: Mammogram screening. Recommended self breast awareness. Pap and HR HPV as above. Guidelines for Calcium, Vitamin D, regular exercise program including cardiovascular and weight bearing exercise. BMD this year.  Return for pelvic ultrasound. Follow up annually and prn.   After visit summary provided.

## 2019-05-28 NOTE — Telephone Encounter (Signed)
I am trying to update my records for a visit tomorrow and I found that the "generic" allergy from [employer] is "cetirizine hydrochloride tablets, 10mg " to be specific. I could not figure out how to change that information.

## 2019-05-29 ENCOUNTER — Encounter: Payer: Self-pay | Admitting: Obstetrics and Gynecology

## 2019-05-29 ENCOUNTER — Ambulatory Visit (INDEPENDENT_AMBULATORY_CARE_PROVIDER_SITE_OTHER): Payer: BLUE CROSS/BLUE SHIELD | Admitting: Obstetrics and Gynecology

## 2019-05-29 ENCOUNTER — Other Ambulatory Visit: Payer: Self-pay

## 2019-05-29 VITALS — BP 116/78 | HR 68 | Temp 97.6°F | Resp 12 | Ht 60.5 in | Wt 170.0 lb

## 2019-05-29 DIAGNOSIS — Z780 Asymptomatic menopausal state: Secondary | ICD-10-CM | POA: Diagnosis not present

## 2019-05-29 DIAGNOSIS — Z01419 Encounter for gynecological examination (general) (routine) without abnormal findings: Secondary | ICD-10-CM

## 2019-05-29 DIAGNOSIS — Z8041 Family history of malignant neoplasm of ovary: Secondary | ICD-10-CM | POA: Diagnosis not present

## 2019-05-29 NOTE — Patient Instructions (Signed)

## 2019-06-03 ENCOUNTER — Telehealth: Payer: Self-pay | Admitting: Obstetrics and Gynecology

## 2019-06-03 NOTE — Telephone Encounter (Signed)
Spoke with patient regarding benefit for an ultrasound. Patient understood and agreeable. Patient ready to schedule. Patient scheduled 06/25/2019 with Dr Quincy Simmonds.Patient requested this date, to avoid conflict with babysitting her grandchildren. Patient is aware of appointment date, arrival time and cancellation policy.  No further questions.   Forwarding to Dr Quincy Simmonds for final review. Patient is agreeable to disposition. Will close encounter

## 2019-06-04 DIAGNOSIS — J301 Allergic rhinitis due to pollen: Secondary | ICD-10-CM | POA: Diagnosis not present

## 2019-06-04 DIAGNOSIS — J3089 Other allergic rhinitis: Secondary | ICD-10-CM | POA: Diagnosis not present

## 2019-06-18 DIAGNOSIS — J301 Allergic rhinitis due to pollen: Secondary | ICD-10-CM | POA: Diagnosis not present

## 2019-06-18 DIAGNOSIS — J3089 Other allergic rhinitis: Secondary | ICD-10-CM | POA: Diagnosis not present

## 2019-06-23 ENCOUNTER — Other Ambulatory Visit: Payer: Self-pay

## 2019-06-25 ENCOUNTER — Ambulatory Visit (INDEPENDENT_AMBULATORY_CARE_PROVIDER_SITE_OTHER): Payer: BLUE CROSS/BLUE SHIELD | Admitting: Obstetrics and Gynecology

## 2019-06-25 ENCOUNTER — Encounter: Payer: Self-pay | Admitting: Obstetrics and Gynecology

## 2019-06-25 ENCOUNTER — Other Ambulatory Visit: Payer: Self-pay

## 2019-06-25 ENCOUNTER — Ambulatory Visit (INDEPENDENT_AMBULATORY_CARE_PROVIDER_SITE_OTHER): Payer: BLUE CROSS/BLUE SHIELD

## 2019-06-25 VITALS — BP 116/70 | HR 72 | Temp 97.9°F | Resp 14 | Ht 60.5 in | Wt 165.0 lb

## 2019-06-25 DIAGNOSIS — R634 Abnormal weight loss: Secondary | ICD-10-CM

## 2019-06-25 DIAGNOSIS — Z8041 Family history of malignant neoplasm of ovary: Secondary | ICD-10-CM

## 2019-06-25 NOTE — Progress Notes (Signed)
GYNECOLOGY  VISIT   HPI: 64 y.o.   Widowed  Caucasian  female   G53P2011 with Patient's last menstrual period was 12/31/2009 (approximate).   here for ultrasound due to family history of ovarian cancer in her mother.   She thinks her mother had negative genetic testing.   Patient denies vaginal bleeding.   Patient is working on weight loss.  Has lost 20 pounds since April.  Loves the total gym.  Walking early in the am also.   GYNECOLOGIC HISTORY: Patient's last menstrual period was 12/31/2009 (approximate). Contraception:  Postmenopausal Menopausal hormone therapy:  none Last mammogram:  10/08/18 BIRADS 1 negative/density b Last pap smear:   03/06/17 Neg:Neg HR HPV        OB History    Gravida  3   Para  2   Term  2   Preterm      AB  1   Living  1     SAB      TAB      Ectopic      Multiple      Live Births                 Patient Active Problem List   Diagnosis Date Noted  . Epigastric pain 01/28/2019  . Gastroesophageal reflux disease with esophagitis 01/28/2019  . Chest pain 01/28/2019  . Newly recognized heart murmur 01/28/2019  . Bradycardia 01/28/2019  . Hematuria 05/27/2018  . Overweight (BMI 25.0-29.9) 05/27/2018  . Rhinitis, allergic 06/11/2016  . Obesity 01/11/2016  . Squamous cell carcinoma 09/21/2014  . Chronic allergic rhinitis 05/30/2014  . Elevated LDL cholesterol level 04/14/2014  . Allergic rhinitis 04/14/2014  . Seasonal allergies 04/14/2014  . OAB (overactive bladder) 04/14/2014    Past Medical History:  Diagnosis Date  . Allergy    --takes allergy injections  . Hyperlipidemia   . Perimenopause     Past Surgical History:  Procedure Laterality Date  . BREAST SURGERY    . DILATION AND CURETTAGE OF UTERUS  10/31/2010   Hysteroscopy with dilation and curettage - Dr. Quincy Simmonds - [facility]  . KNEE SURGERY Bilateral   . REFRACTIVE SURGERY Bilateral     Current Outpatient Medications  Medication Sig Dispense Refill  .  azelastine (ASTELIN) 0.1 % nasal spray   5  . cetirizine (ZYRTEC) 10 MG tablet Take 10 mg by mouth daily as needed for allergies. Patient reported    . fluticasone (FLONASE) 50 MCG/ACT nasal spray Place 2 sprays into both nostrils daily. 16 g 2  . levocetirizine (XYZAL) 5 MG tablet Take 1 tablet (5 mg total) by mouth every evening. 90 tablet 4  . lovastatin (MEVACOR) 40 MG tablet TAKE 1 TABLET (40 MG TOTAL) BY MOUTH AT BEDTIME. 90 tablet 1  . montelukast (SINGULAIR) 10 MG tablet TAKE 1 TABLET BY MOUTH EVERYDAY AT BEDTIME 90 tablet 0  . PROAIR RESPICLICK 952 (90 BASE) MCG/ACT AEPB   3  . traZODone (DESYREL) 50 MG tablet Take 0.5-1 tablets (25-50 mg total) by mouth at bedtime as needed for sleep. 30 tablet 1  . UNABLE TO FIND Med Name: Allergy Shots     No current facility-administered medications for this visit.      ALLERGIES: Dust mite extract and Tree extract  Family History  Problem Relation Age of Onset  . Hypertension Mother   . Stroke Mother   . Cancer Mother 47       ovarian ca, hx of TTP  . Hyperlipidemia  Mother   . Ovarian cancer Mother   . Hypertension Father   . Cancer Father 50       bladder ca  . Hyperlipidemia Father   . Dementia Father        lewy body dementia  . Anemia Sister   . Diabetes Brother   . Mental illness Sister        mentally disabled--lives in group home in [employer]  . Asthma Brother        allergy induced    Social History   Socioeconomic History  . Marital status: Widowed    Spouse name: Not on file  . Number of children: 2  . Years of education: Not on file  . Highest education level: Not on file  Occupational History  . Not on file  Social Needs  . Financial resource strain: Not on file  . Food insecurity    Worry: Not on file    Inability: Not on file  . Transportation needs    Medical: Not on file    Non-medical: Not on file  Tobacco Use  . Smoking status: Never Smoker  . Smokeless tobacco: Never Used  Substance and Sexual  Activity  . Alcohol use: Yes    Alcohol/week: 2.0 standard drinks    Types: 2 Glasses of wine per week  . Drug use: Never  . Sexual activity: Not Currently    Partners: Male    Birth control/protection: Post-menopausal  Lifestyle  . Physical activity    Days per week: Not on file    Minutes per session: Not on file  . Stress: Not on file  Relationships  . Social Herbalist on phone: Not on file    Gets together: Not on file    Attends religious service: Not on file    Active member of club or organization: Not on file    Attends meetings of clubs or organizations: Not on file    Relationship status: Not on file  . Intimate partner violence    Fear of current or ex partner: Not on file    Emotionally abused: Not on file    Physically abused: Not on file    Forced sexual activity: Not on file  Other Topics Concern  . Not on file  Social History Narrative  . Not on file    Review of Systems  Constitutional: Negative.   HENT: Negative.   Eyes: Negative.   Respiratory: Negative.   Cardiovascular: Negative.   Gastrointestinal: Negative.   Endocrine: Negative.   Genitourinary: Negative.   Musculoskeletal: Negative.   Skin: Negative.   Allergic/Immunologic: Negative.   Neurological: Negative.   Hematological: Negative.   Psychiatric/Behavioral: Negative.     PHYSICAL EXAMINATION:    BP 116/70 (BP Location: Left Arm, Patient Position: Sitting, Cuff Size: Normal)   Pulse 72   Temp 97.9 F (36.6 C) (Temporal)   Resp 14   Ht 5' 0.5" (1.537 m)   Wt 165 lb (74.8 kg)   LMP 12/31/2009 (Approximate)   BMI 31.69 kg/m     General appearance: alert, cooperative and appears stated age  Pelvic US Uterus no masses.  EMS 4.85 mm.  Ovaries normal.  No free fluid.   ASSESSMENT  Family history of ovarian cancer.  Normal pelvic ultrasound.  Successful weight loss.   PLAN  We reviewed the reassuring US findings.  We reviewed signs and symptoms of ovarian  cancer.  No CA125 indicated.  Will plan  or periodic pelvic ultrasound.  I congratulated her on her weight loss.  We discussed how his will facilitate her pelvic exams and reduce risk of cardiovascular disease/diabetes.  FU for annual exam and prn.    An After Visit Summary was printed and given to the patient.  __15____ minutes face to face time of which over 50% was spent in counseling.

## 2019-06-25 NOTE — Progress Notes (Signed)
Encounter reviewed by Dr. Brook Amundson C. Silva.  

## 2019-07-01 ENCOUNTER — Encounter: Payer: Self-pay | Admitting: Obstetrics and Gynecology

## 2019-07-02 ENCOUNTER — Other Ambulatory Visit: Payer: Self-pay | Admitting: Physician Assistant

## 2019-07-02 ENCOUNTER — Telehealth: Payer: Self-pay | Admitting: Obstetrics and Gynecology

## 2019-07-02 DIAGNOSIS — J3089 Other allergic rhinitis: Secondary | ICD-10-CM | POA: Diagnosis not present

## 2019-07-02 DIAGNOSIS — J301 Allergic rhinitis due to pollen: Secondary | ICD-10-CM | POA: Diagnosis not present

## 2019-07-02 DIAGNOSIS — Z03818 Encounter for observation for suspected exposure to other biological agents ruled out: Secondary | ICD-10-CM | POA: Diagnosis not present

## 2019-07-02 NOTE — Telephone Encounter (Signed)
Called patient. MyChart states she is not due for MMG until 10/21 and she wanted to make sure this is correct. Advised patient we recommend screening MMG yearly. She will schedule MMG/BMD in 10/2019.

## 2019-07-02 NOTE — Telephone Encounter (Signed)
Message  Two questions: I got a call today to get a bone density test scheduled. She said I had a mammogram due for October, did I want to wait and schedule then. I told her yes, however, after logging in because I received an email on the [facility] for test results and it shows the mammogram is not due until October of 2021.    1 - Do I need to schedule the bone density test for 2020, and  2 - When I logged in for the test results, it just showed:            Result Narrative         SEE PROGRESS NOTE    Does anything need to be seen for the test results?    Thank you,   [name]

## 2019-07-07 DIAGNOSIS — D2262 Melanocytic nevi of left upper limb, including shoulder: Secondary | ICD-10-CM | POA: Diagnosis not present

## 2019-07-07 DIAGNOSIS — L819 Disorder of pigmentation, unspecified: Secondary | ICD-10-CM | POA: Diagnosis not present

## 2019-07-07 DIAGNOSIS — L821 Other seborrheic keratosis: Secondary | ICD-10-CM | POA: Diagnosis not present

## 2019-07-07 DIAGNOSIS — Z85828 Personal history of other malignant neoplasm of skin: Secondary | ICD-10-CM | POA: Diagnosis not present

## 2019-07-07 DIAGNOSIS — L570 Actinic keratosis: Secondary | ICD-10-CM | POA: Diagnosis not present

## 2019-07-14 ENCOUNTER — Encounter: Payer: Self-pay | Admitting: Obstetrics and Gynecology

## 2019-07-16 DIAGNOSIS — J301 Allergic rhinitis due to pollen: Secondary | ICD-10-CM | POA: Diagnosis not present

## 2019-07-16 DIAGNOSIS — J3089 Other allergic rhinitis: Secondary | ICD-10-CM | POA: Diagnosis not present

## 2019-07-30 DIAGNOSIS — J301 Allergic rhinitis due to pollen: Secondary | ICD-10-CM | POA: Diagnosis not present

## 2019-07-30 DIAGNOSIS — J3089 Other allergic rhinitis: Secondary | ICD-10-CM | POA: Diagnosis not present

## 2019-08-03 ENCOUNTER — Encounter: Payer: Self-pay | Admitting: Physician Assistant

## 2019-08-11 DIAGNOSIS — J3089 Other allergic rhinitis: Secondary | ICD-10-CM | POA: Diagnosis not present

## 2019-08-11 DIAGNOSIS — J301 Allergic rhinitis due to pollen: Secondary | ICD-10-CM | POA: Diagnosis not present

## 2019-08-15 DIAGNOSIS — J301 Allergic rhinitis due to pollen: Secondary | ICD-10-CM | POA: Diagnosis not present

## 2019-08-15 DIAGNOSIS — J3089 Other allergic rhinitis: Secondary | ICD-10-CM | POA: Diagnosis not present

## 2019-08-25 DIAGNOSIS — J301 Allergic rhinitis due to pollen: Secondary | ICD-10-CM | POA: Diagnosis not present

## 2019-08-25 DIAGNOSIS — J3089 Other allergic rhinitis: Secondary | ICD-10-CM | POA: Diagnosis not present

## 2019-08-31 ENCOUNTER — Other Ambulatory Visit: Payer: Self-pay | Admitting: Osteopathic Medicine

## 2019-09-15 DIAGNOSIS — J3089 Other allergic rhinitis: Secondary | ICD-10-CM | POA: Diagnosis not present

## 2019-09-15 DIAGNOSIS — J301 Allergic rhinitis due to pollen: Secondary | ICD-10-CM | POA: Diagnosis not present

## 2019-09-21 ENCOUNTER — Encounter: Payer: Self-pay | Admitting: Physician Assistant

## 2019-09-23 ENCOUNTER — Other Ambulatory Visit: Payer: Self-pay

## 2019-09-23 ENCOUNTER — Encounter: Payer: BLUE CROSS/BLUE SHIELD | Admitting: Physician Assistant

## 2019-09-23 ENCOUNTER — Ambulatory Visit: Payer: BLUE CROSS/BLUE SHIELD

## 2019-09-23 ENCOUNTER — Other Ambulatory Visit: Payer: Self-pay | Admitting: Physician Assistant

## 2019-09-24 NOTE — Progress Notes (Signed)
This encounter was created in error - please disregard.

## 2019-09-29 ENCOUNTER — Other Ambulatory Visit: Payer: Self-pay | Admitting: Obstetrics and Gynecology

## 2019-09-29 DIAGNOSIS — J3089 Other allergic rhinitis: Secondary | ICD-10-CM | POA: Diagnosis not present

## 2019-09-29 DIAGNOSIS — J301 Allergic rhinitis due to pollen: Secondary | ICD-10-CM | POA: Diagnosis not present

## 2019-09-29 DIAGNOSIS — Z1231 Encounter for screening mammogram for malignant neoplasm of breast: Secondary | ICD-10-CM

## 2019-10-08 DIAGNOSIS — J3089 Other allergic rhinitis: Secondary | ICD-10-CM | POA: Diagnosis not present

## 2019-10-08 DIAGNOSIS — J301 Allergic rhinitis due to pollen: Secondary | ICD-10-CM | POA: Diagnosis not present

## 2019-10-10 ENCOUNTER — Encounter: Payer: Self-pay | Admitting: Physician Assistant

## 2019-10-22 DIAGNOSIS — J301 Allergic rhinitis due to pollen: Secondary | ICD-10-CM | POA: Diagnosis not present

## 2019-10-22 DIAGNOSIS — J3089 Other allergic rhinitis: Secondary | ICD-10-CM | POA: Diagnosis not present

## 2019-10-27 ENCOUNTER — Other Ambulatory Visit: Payer: Self-pay | Admitting: Physician Assistant

## 2019-10-28 ENCOUNTER — Other Ambulatory Visit: Payer: Self-pay

## 2019-10-28 ENCOUNTER — Ambulatory Visit (INDEPENDENT_AMBULATORY_CARE_PROVIDER_SITE_OTHER): Payer: BLUE CROSS/BLUE SHIELD

## 2019-10-28 DIAGNOSIS — Z780 Asymptomatic menopausal state: Secondary | ICD-10-CM

## 2019-10-28 DIAGNOSIS — Z1231 Encounter for screening mammogram for malignant neoplasm of breast: Secondary | ICD-10-CM | POA: Diagnosis not present

## 2019-10-28 DIAGNOSIS — M85852 Other specified disorders of bone density and structure, left thigh: Secondary | ICD-10-CM | POA: Diagnosis not present

## 2019-10-29 ENCOUNTER — Telehealth: Payer: Self-pay | Admitting: Obstetrics and Gynecology

## 2019-10-29 ENCOUNTER — Encounter: Payer: Self-pay | Admitting: Obstetrics and Gynecology

## 2019-10-29 NOTE — Telephone Encounter (Signed)
Patient sent the following message through [facility]. Routing to triage to assist patient with request.  [place], [place]  Phone Number: [phone]        Dr. Quincy Simmonds, I know I received your email about my bone density test yesterday, but cannot find it. You mentioned about taking a Vitamin D to help with the Osteoporosis (is that right)? I am already now taking a Multivitamin (Centrum Silver), B-12 1000 mcg [facility]) and D3 1000 U [employer]). I started these after loosing the weight and feeling tired. Jade Breeback instructed me to start these and see how they might help. Since I can't find your information, could you resend or post it again?   Thank you,  Rebekah [name]

## 2019-10-29 NOTE — Telephone Encounter (Signed)
Spoke with patient, reviewed 10/28/19 BMD results per Dr. Quincy Simmonds.   Patient states she was advised at the imaging center that she has arthritis in lower back and was unsure how this effected results.   Advised per review of report "Lumbar spine was not utilized due to advanced degenerative changes". Advised I will have Dr. Quincy Simmonds review and I will return call if any additional recommendations. Patient verbalizes understanding and is agreeable.   Routing to provider for final review. Patient is agreeable to disposition. Will close encounter.

## 2019-11-05 ENCOUNTER — Other Ambulatory Visit: Payer: Self-pay | Admitting: Physician Assistant

## 2019-11-05 DIAGNOSIS — J3089 Other allergic rhinitis: Secondary | ICD-10-CM | POA: Diagnosis not present

## 2019-11-05 DIAGNOSIS — J301 Allergic rhinitis due to pollen: Secondary | ICD-10-CM | POA: Diagnosis not present

## 2019-11-06 ENCOUNTER — Telehealth: Payer: Self-pay | Admitting: Physician Assistant

## 2019-11-06 NOTE — Telephone Encounter (Signed)
Appointment has been made. No further questions at this time.  

## 2019-11-06 NOTE — Telephone Encounter (Signed)
-----   Message from Lubertha Basque sent at 11/06/2019  8:50 AM EST ----- Appointment has been scheduled.   ----- Message ----- From: [name], CMA Sent: 11/05/2019   4:41 PM EST To: Lubertha Basque, Ernst Breach  Please call this patient and set up a follow with [facility] before she can get further refills. Please and thanks.

## 2019-11-06 NOTE — Telephone Encounter (Signed)
-----   Message from [name], [place] sent at 11/05/2019  4:41 PM EST ----- Please call this patient and set up a follow with [facility] before she can get further refills. Please and thanks.

## 2019-11-09 ENCOUNTER — Ambulatory Visit (INDEPENDENT_AMBULATORY_CARE_PROVIDER_SITE_OTHER): Payer: BLUE CROSS/BLUE SHIELD | Admitting: Physician Assistant

## 2019-11-09 ENCOUNTER — Encounter: Payer: Self-pay | Admitting: Physician Assistant

## 2019-11-09 ENCOUNTER — Other Ambulatory Visit: Payer: Self-pay

## 2019-11-09 VITALS — BP 142/72 | HR 54 | Ht 60.0 in | Wt 157.0 lb

## 2019-11-09 DIAGNOSIS — E7800 Pure hypercholesterolemia, unspecified: Secondary | ICD-10-CM

## 2019-11-09 DIAGNOSIS — J309 Allergic rhinitis, unspecified: Secondary | ICD-10-CM

## 2019-11-09 DIAGNOSIS — Z0000 Encounter for general adult medical examination without abnormal findings: Secondary | ICD-10-CM | POA: Diagnosis not present

## 2019-11-09 DIAGNOSIS — Z638 Other specified problems related to primary support group: Secondary | ICD-10-CM | POA: Diagnosis not present

## 2019-11-09 DIAGNOSIS — Z131 Encounter for screening for diabetes mellitus: Secondary | ICD-10-CM

## 2019-11-09 DIAGNOSIS — K2100 Gastro-esophageal reflux disease with esophagitis, without bleeding: Secondary | ICD-10-CM

## 2019-11-09 DIAGNOSIS — E6609 Other obesity due to excess calories: Secondary | ICD-10-CM

## 2019-11-09 DIAGNOSIS — Z6830 Body mass index (BMI) 30.0-30.9, adult: Secondary | ICD-10-CM

## 2019-11-09 DIAGNOSIS — G4733 Obstructive sleep apnea (adult) (pediatric): Secondary | ICD-10-CM

## 2019-11-09 DIAGNOSIS — F5101 Primary insomnia: Secondary | ICD-10-CM

## 2019-11-09 MED ORDER — TRAZODONE HCL 50 MG PO TABS: 25.0000 mg | ORAL_TABLET | Freq: Every evening | ORAL | 0 refills | Status: DC | PRN

## 2019-11-09 MED ORDER — LEVOCETIRIZINE DIHYDROCHLORIDE 5 MG PO TABS: 5.0000 mg | ORAL_TABLET | Freq: Every evening | ORAL | 4 refills | Status: DC

## 2019-11-09 MED ORDER — MONTELUKAST SODIUM 10 MG PO TABS: ORAL_TABLET | ORAL | 4 refills | Status: DC

## 2019-11-09 NOTE — Progress Notes (Signed)
Subjective:    Patient ID: Rebekah [name], female    DOB: June 29, 1955, 64 y.o.   MRN: [id number]  HPI  Pt is a 64 yo obese female with chronic allergies and on shots, GERD, OSA, anxiety/depression who presents to the clinic for follow up and medication refills.   Pt is doing really well. She is walking and doing NOOM online. She is down from 180 to 157.   She is under some family stress right now. She is talking with a Social worker. There are some family disputes about who gets what land and it is stressing her out. Causing her not to eat well and not sleeping. She has trazodone which she uses as needed and is helpful.    .. Active Ambulatory Problems    Diagnosis Date Noted  . Elevated LDL cholesterol level 04/14/2014  . Allergic rhinitis 04/14/2014  . Seasonal allergies 04/14/2014  . OAB (overactive bladder) 04/14/2014  . Chronic allergic rhinitis 05/30/2014  . Squamous cell carcinoma 09/21/2014  . Class 1 obesity due to excess calories without serious comorbidity with body mass index (BMI) of 30.0 to 30.9 in adult 01/11/2016  . Rhinitis, allergic 06/11/2016  . Hematuria 05/27/2018  . Overweight (BMI 25.0-29.9) 05/27/2018  . Epigastric pain 01/28/2019  . Gastroesophageal reflux disease with esophagitis 01/28/2019  . Chest pain 01/28/2019  . Newly recognized heart murmur 01/28/2019  . Bradycardia 01/28/2019  . Stress due to family tension 11/09/2019  . OSA (obstructive sleep apnea) 11/09/2019   Resolved Ambulatory Problems    Diagnosis Date Noted  . No Resolved Ambulatory Problems   Past Medical History:  Diagnosis Date  . Allergy   . Hyperlipidemia   . Osteopenia 2020  . Perimenopause    .Marland Kitchen Family History  Problem Relation Age of Onset  . Hypertension Mother   . Stroke Mother   . Cancer Mother 84       ovarian ca, hx of TTP  . Hyperlipidemia Mother   . Ovarian cancer Mother   . Hypertension Father   . Cancer Father 105       bladder ca  . Hyperlipidemia Father    . Dementia Father        lewy body dementia  . Anemia Sister   . Diabetes Brother   . Mental illness Sister        mentally disabled--lives in group home in [place]  . Asthma Brother        allergy induced   .Marland Kitchen Social History   Socioeconomic History  . Marital status: Widowed    Spouse name: Not on file  . Number of children: 2  . Years of education: Not on file  . Highest education level: Not on file  Occupational History  . Not on file  Social Needs  . Financial resource strain: Not on file  . Food insecurity    Worry: Not on file    Inability: Not on file  . Transportation needs    Medical: Not on file    Non-medical: Not on file  Tobacco Use  . Smoking status: Never Smoker  . Smokeless tobacco: Never Used  Substance and Sexual Activity  . Alcohol use: Yes    Alcohol/week: 2.0 standard drinks    Types: 2 Glasses of wine per week  . Drug use: Never  . Sexual activity: Not Currently    Partners: Male    Birth control/protection: Post-menopausal  Lifestyle  . Physical activity    Days per week:  Not on file    Minutes per session: Not on file  . Stress: Not on file  Relationships  . Social Herbalist on phone: Not on file    Gets together: Not on file    Attends religious service: Not on file    Active member of club or organization: Not on file    Attends meetings of clubs or organizations: Not on file    Relationship status: Not on file  . Intimate partner violence    Fear of current or ex partner: Not on file    Emotionally abused: Not on file    Physically abused: Not on file    Forced sexual activity: Not on file  Other Topics Concern  . Not on file  Social History Narrative  . Not on file      Review of Systems  All other systems reviewed and are negative.      Objective:   Physical Exam BP (!) 142/72   Pulse (!) 54   Ht 5' (1.524 m)   Wt 157 lb (71.2 kg)   LMP 12/31/2009 (Approximate)   SpO2 100%   BMI 30.66 kg/m    General Appearance:    Alert, cooperative, no distress, appears stated age  Head:    Normocephalic, without obvious abnormality, atraumatic  Eyes:    PERRL, conjunctiva/corneas clear, EOM's intact, fundi    benign, both eyes  Ears:    Normal TM's and external ear canals, both ears  Nose:   Nares normal, septum midline, mucosa normal, no drainage    or sinus tenderness  Throat:   Lips, mucosa, and tongue normal; teeth and gums normal  Neck:   Supple, symmetrical, trachea midline, no adenopathy;    thyroid:  no enlargement/tenderness/nodules; no carotid   bruit or JVD  Back:     Symmetric, no curvature, ROM normal, no CVA tenderness  Lungs:     Clear to auscultation bilaterally, respirations unlabored  Chest Wall:    No tenderness or deformity   Heart:    Regular rate and rhythm, S1 and S2 normal, no murmur, rub   or gallop     Abdomen:     Soft, non-tender, bowel sounds active all four quadrants,    no masses, no organomegaly        Extremities:   Extremities normal, atraumatic, no cyanosis or edema  Pulses:   2+ and symmetric all extremities  Skin:   Skin color, texture, turgor normal, no rashes or lesions  Lymph nodes:   Cervical, supraclavicular, and axillary nodes normal  Neurologic:   CNII-XII intact, normal strength, sensation and reflexes    throughout        .Marland Kitchen Depression screen [facility] 2/9 11/09/2019 01/28/2019 10/28/2017  Decreased Interest 1 1 0  Down, Depressed, Hopeless 1 0 0  PHQ - 2 Score 2 1 0  Altered sleeping 1 2 2   Tired, decreased energy 1 0 3  Change in appetite 1 0 3  Feeling bad or failure about yourself  1 0 0  Trouble concentrating 0 1 1  Moving slowly or fidgety/restless 0 0 0  Suicidal thoughts 0 0 0  PHQ-9 Score 6 4 9   Difficult doing work/chores Not difficult at all Not difficult at all -   .. GAD 7 : Generalized Anxiety Score 11/09/2019 01/28/2019  Nervous, Anxious, on Edge 1 0  Control/stop worrying 1 1  Worry too much - different things 1 1   Trouble  relaxing 1 0  Restless 1 0  Easily annoyed or irritable 0 1  Afraid - awful might happen 0 1  Total GAD 7 Score 5 4  Anxiety Difficulty Not difficult at all Not difficult at all     Assessment & Plan:  Marland KitchenMarland KitchenLamiya was seen today for follow-up.  Diagnoses and all orders for this visit:  Routine physical examination -     Lipid Panel w/reflex Direct LDL -     COMPLETE METABOLIC PANEL WITH GFR -     CBC w/Diff  Stress due to family tension  Chronic allergic rhinitis -     montelukast (SINGULAIR) 10 MG tablet; Take one tablet daily. -     levocetirizine (XYZAL) 5 MG tablet; Take 1 tablet (5 mg total) by mouth every evening. -     CBC w/Diff  Gastroesophageal reflux disease with esophagitis without hemorrhage  Elevated LDL cholesterol level -     Lipid Panel w/reflex Direct LDL  Class 1 obesity due to excess calories without serious comorbidity with body mass index (BMI) of 30.0 to 30.9 in adult  Screening for diabetes mellitus -     COMPLETE METABOLIC PANEL WITH GFR  OSA (obstructive sleep apnea)  Primary insomnia -     traZODone (DESYREL) 50 MG tablet; Take 0.5-1 tablets (25-50 mg total) by mouth at bedtime as needed for sleep.   .. Discussed 150 minutes of exercise a week.  Encouraged vitamin D 1000 units and Calcium 1300mg  or 4 servings of dairy a day.  Fasting labs ordered.  Colonoscopy UTD. Mammogram UTD. Pap UTD.  shingrix UTD.   Refilled medications. Keep up the great work with weight loss and staying active.   Stress has increased. Continue with counseling. Follow up as needed.

## 2019-11-19 DIAGNOSIS — J301 Allergic rhinitis due to pollen: Secondary | ICD-10-CM | POA: Diagnosis not present

## 2019-11-19 DIAGNOSIS — Z131 Encounter for screening for diabetes mellitus: Secondary | ICD-10-CM | POA: Diagnosis not present

## 2019-11-19 DIAGNOSIS — J309 Allergic rhinitis, unspecified: Secondary | ICD-10-CM | POA: Diagnosis not present

## 2019-11-19 DIAGNOSIS — E7800 Pure hypercholesterolemia, unspecified: Secondary | ICD-10-CM | POA: Diagnosis not present

## 2019-11-19 DIAGNOSIS — J3089 Other allergic rhinitis: Secondary | ICD-10-CM | POA: Diagnosis not present

## 2019-11-20 LAB — CBC WITH DIFFERENTIAL/PLATELET
Absolute Monocytes: 351 cells/uL (ref 200–950)
Basophils Absolute: 32 cells/uL (ref 0–200)
Basophils Relative: 0.6 %
Eosinophils Absolute: 81 cells/uL (ref 15–500)
Eosinophils Relative: 1.5 %
HCT: 42.8 % (ref 35.0–45.0)
Hemoglobin: 13.7 g/dL (ref 11.7–15.5)
Lymphs Abs: 1798 cells/uL (ref 850–3900)
MCH: 29 pg (ref 27.0–33.0)
MCHC: 32 g/dL (ref 32.0–36.0)
MCV: 90.7 fL (ref 80.0–100.0)
MPV: 12.5 fL (ref 7.5–12.5)
Monocytes Relative: 6.5 %
Neutro Abs: 3137 cells/uL (ref 1500–7800)
Neutrophils Relative %: 58.1 %
Platelets: 220 10*3/uL (ref 140–400)
RBC: 4.72 10*6/uL (ref 3.80–5.10)
RDW: 12.3 % (ref 11.0–15.0)
Total Lymphocyte: 33.3 %
WBC: 5.4 10*3/uL (ref 3.8–10.8)

## 2019-11-20 LAB — COMPLETE METABOLIC PANEL WITH GFR
AG Ratio: 1.8 (calc) (ref 1.0–2.5)
ALT: 23 U/L (ref 6–29)
AST: 24 U/L (ref 10–35)
Albumin: 4.5 g/dL (ref 3.6–5.1)
Alkaline phosphatase (APISO): 62 U/L (ref 37–153)
BUN: 12 mg/dL (ref 7–25)
CO2: 28 mmol/L (ref 20–32)
Calcium: 9.5 mg/dL (ref 8.6–10.4)
Chloride: 106 mmol/L (ref 98–110)
Creat: 0.77 mg/dL (ref 0.50–0.99)
GFR, Est African American: 95 mL/min/{1.73_m2} (ref >=60)
GFR, Est Non African American: 82 mL/min/{1.73_m2} (ref >=60)
Globulin: 2.5 g/dL (calc) (ref 1.9–3.7)
Glucose, Bld: 89 mg/dL (ref 65–99)
Potassium: 4.8 mmol/L (ref 3.5–5.3)
Sodium: 143 mmol/L (ref 135–146)
Total Bilirubin: 0.5 mg/dL (ref 0.2–1.2)
Total Protein: 7 g/dL (ref 6.1–8.1)

## 2019-11-20 LAB — LIPID PANEL W/REFLEX DIRECT LDL
Cholesterol: 201 mg/dL — ABNORMAL HIGH (ref <200)
HDL: 68 mg/dL (ref >=50)
LDL Cholesterol (Calc): 116 mg/dL (calc) — ABNORMAL HIGH
Non-HDL Cholesterol (Calc): 133 mg/dL (calc) — ABNORMAL HIGH (ref <130)
Total CHOL/HDL Ratio: 3 (calc) (ref <5.0)
Triglycerides: 78 mg/dL (ref <150)

## 2019-11-20 NOTE — Progress Notes (Signed)
Bad cholesterol stable.  Good cholesterol went up.  Continue on mevacor.  Kidney, liver, glucose looks great.

## 2019-11-23 ENCOUNTER — Encounter: Payer: Self-pay | Admitting: Physician Assistant

## 2019-12-03 DIAGNOSIS — J301 Allergic rhinitis due to pollen: Secondary | ICD-10-CM | POA: Diagnosis not present

## 2019-12-03 DIAGNOSIS — J3089 Other allergic rhinitis: Secondary | ICD-10-CM | POA: Diagnosis not present

## 2019-12-17 DIAGNOSIS — J301 Allergic rhinitis due to pollen: Secondary | ICD-10-CM | POA: Diagnosis not present

## 2019-12-17 DIAGNOSIS — J3089 Other allergic rhinitis: Secondary | ICD-10-CM | POA: Diagnosis not present

## 2019-12-29 DIAGNOSIS — J301 Allergic rhinitis due to pollen: Secondary | ICD-10-CM | POA: Diagnosis not present

## 2019-12-29 DIAGNOSIS — J3089 Other allergic rhinitis: Secondary | ICD-10-CM | POA: Diagnosis not present

## 2020-01-12 DIAGNOSIS — J301 Allergic rhinitis due to pollen: Secondary | ICD-10-CM | POA: Diagnosis not present

## 2020-01-12 DIAGNOSIS — J3089 Other allergic rhinitis: Secondary | ICD-10-CM | POA: Diagnosis not present

## 2020-01-12 DIAGNOSIS — R05 Cough: Secondary | ICD-10-CM | POA: Diagnosis not present

## 2020-01-14 DIAGNOSIS — J301 Allergic rhinitis due to pollen: Secondary | ICD-10-CM | POA: Diagnosis not present

## 2020-01-14 DIAGNOSIS — J3089 Other allergic rhinitis: Secondary | ICD-10-CM | POA: Diagnosis not present

## 2020-01-21 ENCOUNTER — Encounter: Payer: Self-pay | Admitting: Physician Assistant

## 2020-01-22 ENCOUNTER — Encounter: Payer: Self-pay | Admitting: Physician Assistant

## 2020-01-22 DIAGNOSIS — Z91048 Other nonmedicinal substance allergy status: Secondary | ICD-10-CM | POA: Insufficient documentation

## 2020-01-26 DIAGNOSIS — J3089 Other allergic rhinitis: Secondary | ICD-10-CM | POA: Diagnosis not present

## 2020-01-26 DIAGNOSIS — J301 Allergic rhinitis due to pollen: Secondary | ICD-10-CM | POA: Diagnosis not present

## 2020-02-02 DIAGNOSIS — J301 Allergic rhinitis due to pollen: Secondary | ICD-10-CM | POA: Diagnosis not present

## 2020-02-02 DIAGNOSIS — J3089 Other allergic rhinitis: Secondary | ICD-10-CM | POA: Diagnosis not present

## 2020-02-11 ENCOUNTER — Other Ambulatory Visit: Payer: Self-pay | Admitting: Physician Assistant

## 2020-02-11 DIAGNOSIS — F5101 Primary insomnia: Secondary | ICD-10-CM

## 2020-02-11 DIAGNOSIS — J301 Allergic rhinitis due to pollen: Secondary | ICD-10-CM | POA: Diagnosis not present

## 2020-02-11 DIAGNOSIS — J3089 Other allergic rhinitis: Secondary | ICD-10-CM | POA: Diagnosis not present

## 2020-02-21 ENCOUNTER — Encounter: Payer: Self-pay | Admitting: Physician Assistant

## 2020-02-22 ENCOUNTER — Telehealth (INDEPENDENT_AMBULATORY_CARE_PROVIDER_SITE_OTHER): Payer: BC Managed Care – PPO | Admitting: Physician Assistant

## 2020-02-22 VITALS — HR 58 | Temp 97.6°F | Ht 60.0 in | Wt 156.0 lb

## 2020-02-22 DIAGNOSIS — Z638 Other specified problems related to primary support group: Secondary | ICD-10-CM

## 2020-02-22 DIAGNOSIS — F4323 Adjustment disorder with mixed anxiety and depressed mood: Secondary | ICD-10-CM

## 2020-02-22 MED ORDER — CITALOPRAM HYDROBROMIDE 10 MG PO TABS: 10.0000 mg | ORAL_TABLET | Freq: Every day | ORAL | 1 refills | Status: DC

## 2020-02-22 NOTE — Progress Notes (Signed)
Patient ID: Rebekah [name], female   DOB: December 10, 1955, 65 y.o.   MRN: [id number] .Marland KitchenVirtual Visit via Video Note  I connected with Rebekah [name] on 02/22/2020 at 10:10 AM EST by a video enabled telemedicine application and verified that I am speaking with the correct person using two identifiers.  Location: Patient: work Provider: clinic   I discussed the limitations of evaluation and management by telemedicine and the availability of in person appointments. The patient expressed understanding and agreed to proceed.  History of Present Illness: Pt 65 yo female who calls into the clinic to discuss stress, anxiety, depression with family.   The family is in a fight over estate and land. Her family has turned on her and disowned her. This is really hard for her. She does not want to get attorney but feels like she is going to have too. She thought she could manage the stress/anxiety/depression on her own but she cannot. She is not married but has 2 children. They are supportive for her. No SI/HC.   Marland Kitchen. Active Ambulatory Problems    Diagnosis Date Noted  . Elevated LDL cholesterol level 04/14/2014  . Allergic rhinitis 04/14/2014  . Seasonal allergies 04/14/2014  . OAB (overactive bladder) 04/14/2014  . Chronic allergic rhinitis 05/30/2014  . Squamous cell carcinoma 09/21/2014  . Class 1 obesity due to excess calories without serious comorbidity with body mass index (BMI) of 30.0 to 30.9 in adult 01/11/2016  . Rhinitis, allergic 06/11/2016  . Hematuria 05/27/2018  . Overweight (BMI 25.0-29.9) 05/27/2018  . Epigastric pain 01/28/2019  . Gastroesophageal reflux disease with esophagitis 01/28/2019  . Chest pain 01/28/2019  . Newly recognized heart murmur 01/28/2019  . Bradycardia 01/28/2019  . Stress due to family tension 11/09/2019  . OSA (obstructive sleep apnea) 11/09/2019  . Allergy to mold 01/22/2020   Resolved Ambulatory Problems    Diagnosis Date Noted  . No Resolved Ambulatory  Problems   Past Medical History:  Diagnosis Date  . Allergy   . Hyperlipidemia   . Osteopenia 2020  . Perimenopause        Observations/Objective: No acute distress Normal appearance.  Mood anxious.   .. Today's Vitals   02/22/20 0958  Pulse: (!) 58  Temp: 97.6 F (36.4 C)  TempSrc: Oral  Weight: 156 lb (70.8 kg)  Height: 5' (1.524 m)   Body mass index is 30.47 kg/m. .. Depression screen [facility] 2/9 02/22/2020 11/09/2019 01/28/2019 10/28/2017  Decreased Interest 3 1 1  0  Down, Depressed, Hopeless 3 1 0 0  PHQ - 2 Score 6 2 1  0  Altered sleeping 3 1 2 2   Tired, decreased energy 3 1 0 3  Change in appetite 2 1 0 3  Feeling bad or failure about yourself  1 1 0 0  Trouble concentrating 3 0 1 1  Moving slowly or fidgety/restless 3 0 0 0  Suicidal thoughts 0 0 0 0  PHQ-9 Score 21 6 4 9   Difficult doing work/chores Somewhat difficult Not difficult at all Not difficult at all -   .. GAD 7 : Generalized Anxiety Score 02/22/2020 11/09/2019 01/28/2019  Nervous, Anxious, on Edge 3 1 0  Control/stop worrying 3 1 1   Worry too much - different things 3 1 1   Trouble relaxing 2 1 0  Restless 3 1 0  Easily annoyed or irritable 3 0 1  Afraid - awful might happen 3 0 1  Total GAD 7 Score 20 5 4   Anxiety Difficulty  Not difficult at all Not difficult at all Not difficult at all      Assessment and Plan:  Marland KitchenMarland KitchenNioka was seen today for stress.  Diagnoses and all orders for this visit:  Stress due to family tension -     citalopram (CELEXA) 10 MG tablet; Take 1 tablet (10 mg total) by mouth daily. -     Ambulatory referral to Psychology  Adjustment disorder with mixed anxiety and depressed mood -     citalopram (CELEXA) 10 MG tablet; Take 1 tablet (10 mg total) by mouth daily. -     Ambulatory referral to Psychology   PHQ/GAD are very elevated. Start celexa. Referral placed for counseling. Make sure sleeping well and create a good sleep routine. Encouraged exercise regularly. Need  to get a lawyer and get through the stressful negotiations.      Follow Up Instructions:    I discussed the assessment and treatment plan with the patient. The patient was provided an opportunity to ask questions and all were answered. The patient agreed with the plan and demonstrated an understanding of the instructions.   The patient was advised to call back or seek an in-person evaluation if the symptoms worsen or if the condition fails to improve as anticipated.  I provided 20 minutes of non-face-to-face time during this encounter.   Iran Planas, PA-C

## 2020-02-22 NOTE — Progress Notes (Signed)
PHQ9 (21) -GAD7 (20) completed.   Going through a particular situation that is getting worse.   Headaches for the past two days. Didn't get allergy shot last week due to weather (but goes tomorrow).

## 2020-02-23 ENCOUNTER — Encounter: Payer: Self-pay | Admitting: Obstetrics and Gynecology

## 2020-02-23 ENCOUNTER — Encounter: Payer: Self-pay | Admitting: Physician Assistant

## 2020-02-26 ENCOUNTER — Encounter: Payer: Self-pay | Admitting: Physician Assistant

## 2020-02-26 DIAGNOSIS — F4323 Adjustment disorder with mixed anxiety and depressed mood: Secondary | ICD-10-CM | POA: Insufficient documentation

## 2020-03-03 DIAGNOSIS — J3089 Other allergic rhinitis: Secondary | ICD-10-CM | POA: Diagnosis not present

## 2020-03-03 DIAGNOSIS — J301 Allergic rhinitis due to pollen: Secondary | ICD-10-CM | POA: Diagnosis not present

## 2020-03-07 ENCOUNTER — Other Ambulatory Visit: Payer: Self-pay | Admitting: Neurology

## 2020-03-07 DIAGNOSIS — J309 Allergic rhinitis, unspecified: Secondary | ICD-10-CM

## 2020-03-07 MED ORDER — LEVOCETIRIZINE DIHYDROCHLORIDE 5 MG PO TABS: 5.0000 mg | ORAL_TABLET | Freq: Every evening | ORAL | 2 refills | Status: DC

## 2020-03-07 MED ORDER — MONTELUKAST SODIUM 10 MG PO TABS: ORAL_TABLET | ORAL | 2 refills | Status: DC

## 2020-03-07 MED ORDER — FLUTICASONE PROPIONATE 50 MCG/ACT NA SUSP: 2.0000 | Freq: Every day | NASAL | 2 refills | Status: DC

## 2020-03-09 ENCOUNTER — Encounter: Payer: Self-pay | Admitting: Physician Assistant

## 2020-03-09 DIAGNOSIS — J309 Allergic rhinitis, unspecified: Secondary | ICD-10-CM

## 2020-03-10 DIAGNOSIS — J3089 Other allergic rhinitis: Secondary | ICD-10-CM | POA: Diagnosis not present

## 2020-03-10 DIAGNOSIS — J301 Allergic rhinitis due to pollen: Secondary | ICD-10-CM | POA: Diagnosis not present

## 2020-03-15 ENCOUNTER — Other Ambulatory Visit: Payer: Self-pay | Admitting: Neurology

## 2020-03-15 ENCOUNTER — Other Ambulatory Visit: Payer: Self-pay | Admitting: Physician Assistant

## 2020-03-15 DIAGNOSIS — Z638 Other specified problems related to primary support group: Secondary | ICD-10-CM

## 2020-03-15 DIAGNOSIS — J309 Allergic rhinitis, unspecified: Secondary | ICD-10-CM

## 2020-03-15 DIAGNOSIS — F4323 Adjustment disorder with mixed anxiety and depressed mood: Secondary | ICD-10-CM

## 2020-03-15 MED ORDER — MONTELUKAST SODIUM 10 MG PO TABS: ORAL_TABLET | ORAL | 2 refills | Status: DC

## 2020-03-15 NOTE — Progress Notes (Signed)
RX sent to wrong [facility]. Resent.

## 2020-03-17 DIAGNOSIS — J3089 Other allergic rhinitis: Secondary | ICD-10-CM | POA: Diagnosis not present

## 2020-03-17 DIAGNOSIS — J301 Allergic rhinitis due to pollen: Secondary | ICD-10-CM | POA: Diagnosis not present

## 2020-03-18 MED ORDER — LEVOCETIRIZINE DIHYDROCHLORIDE 5 MG PO TABS: 5.0000 mg | ORAL_TABLET | Freq: Every evening | ORAL | 2 refills | Status: DC

## 2020-03-18 MED ORDER — MONTELUKAST SODIUM 10 MG PO TABS: ORAL_TABLET | ORAL | 2 refills | Status: DC

## 2020-03-18 MED ORDER — FLUTICASONE PROPIONATE 50 MCG/ACT NA SUSP: 2.0000 | Freq: Every day | NASAL | 2 refills | Status: DC

## 2020-03-18 NOTE — Addendum Note (Signed)
Addended by: Towana Badger on: 03/18/2020 04:09 PM   Modules accepted: Orders

## 2020-03-18 NOTE — Addendum Note (Signed)
Addended by: Towana Badger on: 03/18/2020 04:10 PM   Modules accepted: Orders

## 2020-03-24 DIAGNOSIS — J3089 Other allergic rhinitis: Secondary | ICD-10-CM | POA: Diagnosis not present

## 2020-03-24 DIAGNOSIS — J301 Allergic rhinitis due to pollen: Secondary | ICD-10-CM | POA: Diagnosis not present

## 2020-03-31 DIAGNOSIS — J3089 Other allergic rhinitis: Secondary | ICD-10-CM | POA: Diagnosis not present

## 2020-03-31 DIAGNOSIS — J301 Allergic rhinitis due to pollen: Secondary | ICD-10-CM | POA: Diagnosis not present

## 2020-04-07 DIAGNOSIS — J3089 Other allergic rhinitis: Secondary | ICD-10-CM | POA: Diagnosis not present

## 2020-04-07 DIAGNOSIS — J301 Allergic rhinitis due to pollen: Secondary | ICD-10-CM | POA: Diagnosis not present

## 2020-04-14 DIAGNOSIS — J3089 Other allergic rhinitis: Secondary | ICD-10-CM | POA: Diagnosis not present

## 2020-04-14 DIAGNOSIS — J301 Allergic rhinitis due to pollen: Secondary | ICD-10-CM | POA: Diagnosis not present

## 2020-04-19 ENCOUNTER — Ambulatory Visit (INDEPENDENT_AMBULATORY_CARE_PROVIDER_SITE_OTHER): Payer: BC Managed Care – PPO | Admitting: Professional

## 2020-04-19 DIAGNOSIS — F4320 Adjustment disorder, unspecified: Secondary | ICD-10-CM

## 2020-04-21 ENCOUNTER — Encounter: Payer: Self-pay | Admitting: Physician Assistant

## 2020-04-21 DIAGNOSIS — J301 Allergic rhinitis due to pollen: Secondary | ICD-10-CM | POA: Diagnosis not present

## 2020-04-21 DIAGNOSIS — J3089 Other allergic rhinitis: Secondary | ICD-10-CM | POA: Diagnosis not present

## 2020-04-27 ENCOUNTER — Ambulatory Visit (INDEPENDENT_AMBULATORY_CARE_PROVIDER_SITE_OTHER): Payer: BC Managed Care – PPO | Admitting: Professional

## 2020-04-27 DIAGNOSIS — F4321 Adjustment disorder with depressed mood: Secondary | ICD-10-CM | POA: Diagnosis not present

## 2020-04-28 DIAGNOSIS — J3089 Other allergic rhinitis: Secondary | ICD-10-CM | POA: Diagnosis not present

## 2020-04-28 DIAGNOSIS — J301 Allergic rhinitis due to pollen: Secondary | ICD-10-CM | POA: Diagnosis not present

## 2020-05-02 ENCOUNTER — Ambulatory Visit (INDEPENDENT_AMBULATORY_CARE_PROVIDER_SITE_OTHER): Payer: BC Managed Care – PPO | Admitting: Professional

## 2020-05-02 DIAGNOSIS — F4322 Adjustment disorder with anxiety: Secondary | ICD-10-CM

## 2020-05-05 DIAGNOSIS — J301 Allergic rhinitis due to pollen: Secondary | ICD-10-CM | POA: Diagnosis not present

## 2020-05-05 DIAGNOSIS — J3089 Other allergic rhinitis: Secondary | ICD-10-CM | POA: Diagnosis not present

## 2020-05-12 DIAGNOSIS — J301 Allergic rhinitis due to pollen: Secondary | ICD-10-CM | POA: Diagnosis not present

## 2020-05-12 DIAGNOSIS — J3089 Other allergic rhinitis: Secondary | ICD-10-CM | POA: Diagnosis not present

## 2020-05-13 ENCOUNTER — Ambulatory Visit: Payer: BC Managed Care – PPO | Admitting: Professional

## 2020-05-16 DIAGNOSIS — J301 Allergic rhinitis due to pollen: Secondary | ICD-10-CM | POA: Diagnosis not present

## 2020-05-16 DIAGNOSIS — J3089 Other allergic rhinitis: Secondary | ICD-10-CM | POA: Diagnosis not present

## 2020-05-19 ENCOUNTER — Other Ambulatory Visit: Payer: Self-pay | Admitting: Physician Assistant

## 2020-05-19 DIAGNOSIS — F5101 Primary insomnia: Secondary | ICD-10-CM

## 2020-05-25 ENCOUNTER — Ambulatory Visit (INDEPENDENT_AMBULATORY_CARE_PROVIDER_SITE_OTHER): Payer: BC Managed Care – PPO | Admitting: Professional

## 2020-05-25 DIAGNOSIS — F4322 Adjustment disorder with anxiety: Secondary | ICD-10-CM

## 2020-05-26 ENCOUNTER — Encounter: Payer: Self-pay | Admitting: Physician Assistant

## 2020-05-26 DIAGNOSIS — J301 Allergic rhinitis due to pollen: Secondary | ICD-10-CM | POA: Diagnosis not present

## 2020-05-26 DIAGNOSIS — J3089 Other allergic rhinitis: Secondary | ICD-10-CM | POA: Diagnosis not present

## 2020-05-31 ENCOUNTER — Other Ambulatory Visit: Payer: Self-pay

## 2020-05-31 NOTE — Progress Notes (Signed)
65 y.o. G59P2011 Widowed Caucasian female here for annual exam.    Denies vaginal bleeding or spotting.   Feeling gassy.  Has not tried Beano.   Completed her Covid vaccine.  Her father got Covid after his first vaccine.  He is OK.   PCP: Iran Planas, PA-C     Patient's last menstrual period was 12/31/2009 (approximate).           Sexually active: No.  The current method of family planning is post menopausal status.    Exercising: No.  The patient does not participate in regular exercise at present. Smoker:  no  Health Maintenance: Pap: 03-06-17 Neg:Neg HR HPV, 02/09/15 negative, neg HR HPV. History of abnormal Pap:  [id number] colposcopy/cryotherapy to cervix. Paps normal since  MMG: 10-28-19 3D/Neg/density B/BiRads1 Colonoscopy:  01-25-16 tubular adenomatous with [facility] in [place] due 12/2020. BMD: 10-28-19  Result :Osteopenia of hip TDaP: 2016 Gardasil:   no HIV: 01-11-16 NR Hep C: 01-11-16 Neg Screening Labs:  PCP.    reports that she has never smoked. She has never used smokeless tobacco. She reports current alcohol use of about 2.0 standard drinks of alcohol per week. She reports that she does not use drugs.  Past Medical History:  Diagnosis Date  . Allergy    --takes allergy injections  . Hyperlipidemia   . Osteopenia 2020  . Perimenopause     Past Surgical History:  Procedure Laterality Date  . BREAST BIOPSY    . BREAST SURGERY    . DILATION AND CURETTAGE OF UTERUS  10/31/2010   Hysteroscopy with dilation and curettage - Dr. Quincy Simmonds - [facility]  . KNEE SURGERY Bilateral   . REDUCTION MAMMAPLASTY    . REFRACTIVE SURGERY Bilateral     Current Outpatient Medications  Medication Sig Dispense Refill  . azelastine (ASTELIN) 0.1 % nasal spray   5  . calcium carbonate (CALCIUM 600) 600 MG TABS tablet     . Cholecalciferol (D3-1000) 25 MCG (1000 UT) tablet     . citalopram (CELEXA) 10 MG tablet TAKE 1 TABLET BY MOUTH EVERY DAY 90 tablet 0  .  Cyanocobalamin (B-12) 1000 MCG TABS     . docusate sodium (COLACE) 100 MG capsule Take 100 mg by mouth 2 (two) times daily.    . fluticasone (FLONASE) 50 MCG/ACT nasal spray Place 2 sprays into both nostrils daily. 16 g 2  . levocetirizine (XYZAL) 5 MG tablet Take 1 tablet (5 mg total) by mouth every evening. 90 tablet 2  . lovastatin (MEVACOR) 40 MG tablet TAKE 1 TABLET (40 MG TOTAL) BY MOUTH AT BEDTIME. 90 tablet 1  . montelukast (SINGULAIR) 10 MG tablet Take one tablet daily. 90 tablet 2  . Multiple Vitamins-Minerals (CENTRUM SILVER 50+WOMEN) TABS     . OVER THE COUNTER MEDICATION Zyflammend - natural anti-inflammatory    . PROAIR RESPICLICK 123XX123 (90 BASE) MCG/ACT AEPB   3  . traZODone (DESYREL) 50 MG tablet Take 0.5-1 tablets (25-50 mg total) by mouth at bedtime. Needs appt 30 tablet 0  . UNABLE TO FIND Med Name: Allergy Shots     No current facility-administered medications for this visit.    Family History  Problem Relation Age of Onset  . Hypertension Mother   . Stroke Mother   . Cancer Mother 43       ovarian ca, hx of TTP  . Hyperlipidemia Mother   . Ovarian cancer Mother   . Hypertension Father   . Cancer Father 59  bladder ca  . Hyperlipidemia Father   . Dementia Father        lewy body dementia  . Anemia Sister   . Diabetes Brother   . Mental illness Sister        mentally disabled--lives in group home in [place]  . Asthma Brother        allergy induced    Review of Systems  All other systems reviewed and are negative.   Exam:   BP 130/78   Pulse (!) 50   Temp (!) 97.1 F (36.2 C) (Temporal)   Resp 16   Ht 4' 11.75" (1.518 m)   Wt 162 lb (73.5 kg)   LMP 12/31/2009 (Approximate)   BMI 31.90 kg/m     General appearance: alert, cooperative and appears stated age Head: normocephalic, without obvious abnormality, atraumatic Neck: no adenopathy, supple, symmetrical, trachea midline and thyroid normal to inspection and palpation Lungs: clear to  auscultation bilaterally Breasts: normal appearance, no masses or tenderness, No nipple retraction or dimpling, No nipple discharge or bleeding, No axillary adenopathy Heart: regular rate and rhythm Abdomen: soft, non-tender; no masses, no organomegaly Extremities: extremities normal, atraumatic, no cyanosis or edema Skin: skin color, texture, turgor normal. No rashes or lesions Lymph nodes: cervical, supraclavicular, and axillary nodes normal. Neurologic: grossly normal  Pelvic: External genitalia:  no lesions              No abnormal inguinal nodes palpated.              Urethra:  normal appearing urethra with no masses, tenderness or lesions              Bartholins and Skenes: normal                 Vagina: normal appearing vagina with normal color and discharge, no lesions              Cervix: no lesions              Pap taken: No. Bimanual Exam:  Uterus:  normal size, contour, position, consistency, mobility, non-tender              Adnexa: no mass, fullness, tenderness              Rectal exam: Yes.  .  Confirms.              Anus:  normal sphincter tone, no lesions  Chaperone was present for exam.  Assessment:   Well woman visit with normal exam. FH ovarian cancer in mother. patient thinks mother had negative genetic testing.   Patient with normal CT and ultrasound in 2015.  Normal pelvic US in 2020. Status post bilateral breast reduction. Osteopenia.   Plan: Mammogram screening discussed. Self breast awareness reviewed. Pap and HR HPV as above. Guidelines for Calcium, Vitamin D, regular exercise program including cardiovascular and weight bearing exercise. Return for pelvic ultrasound.  Labs with PCP.  BMD in 2022.  Follow up annually and prn.   After visit summary provided.

## 2020-06-01 ENCOUNTER — Encounter: Payer: Self-pay | Admitting: Obstetrics and Gynecology

## 2020-06-01 ENCOUNTER — Ambulatory Visit (INDEPENDENT_AMBULATORY_CARE_PROVIDER_SITE_OTHER): Payer: BC Managed Care – PPO | Admitting: Obstetrics and Gynecology

## 2020-06-01 ENCOUNTER — Telehealth: Payer: Self-pay | Admitting: Obstetrics and Gynecology

## 2020-06-01 VITALS — BP 130/78 | HR 50 | Temp 97.1°F | Resp 16 | Ht 59.75 in | Wt 162.0 lb

## 2020-06-01 DIAGNOSIS — Z8041 Family history of malignant neoplasm of ovary: Secondary | ICD-10-CM | POA: Diagnosis not present

## 2020-06-01 DIAGNOSIS — Z01419 Encounter for gynecological examination (general) (routine) without abnormal findings: Secondary | ICD-10-CM | POA: Diagnosis not present

## 2020-06-01 NOTE — Patient Instructions (Signed)

## 2020-06-01 NOTE — Telephone Encounter (Signed)
Call to patient. Per DPR, OK to leave message on voicemail.   Left voicemail requesting a return call to [facility] to review benefits and schedule recommended Pelvic ultrasound with Brook A. Silva, MD, FACOG 

## 2020-06-02 NOTE — Telephone Encounter (Signed)
Spoke with patient regarding benefits for recommended ultrasound. Patient is aware that ultrasound is transvaginal. Patient acknowledges understanding of information presented. Patient is aware of cancellation policy. Patient scheduled appointment for 07/07/2020 at 0830AM with Brook A. Quincy Simmonds, MD, [facility]. Encounter closed.

## 2020-06-06 ENCOUNTER — Encounter: Payer: Self-pay | Admitting: Physician Assistant

## 2020-06-08 ENCOUNTER — Ambulatory Visit (INDEPENDENT_AMBULATORY_CARE_PROVIDER_SITE_OTHER): Payer: BC Managed Care – PPO | Admitting: Professional

## 2020-06-08 DIAGNOSIS — F4322 Adjustment disorder with anxiety: Secondary | ICD-10-CM | POA: Diagnosis not present

## 2020-06-09 ENCOUNTER — Other Ambulatory Visit: Payer: Self-pay | Admitting: Physician Assistant

## 2020-06-09 DIAGNOSIS — J301 Allergic rhinitis due to pollen: Secondary | ICD-10-CM | POA: Diagnosis not present

## 2020-06-09 DIAGNOSIS — F4323 Adjustment disorder with mixed anxiety and depressed mood: Secondary | ICD-10-CM

## 2020-06-09 DIAGNOSIS — Z638 Other specified problems related to primary support group: Secondary | ICD-10-CM

## 2020-06-09 DIAGNOSIS — J3089 Other allergic rhinitis: Secondary | ICD-10-CM | POA: Diagnosis not present

## 2020-06-14 ENCOUNTER — Telehealth: Payer: Self-pay | Admitting: Obstetrics and Gynecology

## 2020-06-14 NOTE — Telephone Encounter (Signed)
Patient cancelled ultrasound for 07/07/20 and would like a call to reschedule.

## 2020-06-14 NOTE — Telephone Encounter (Signed)
Spoke with patient regarding reschedule PUS. Patient rescheduled for 07/21/2020 at 8am with Dr. Quincy Simmonds. Encounter closed.

## 2020-06-23 DIAGNOSIS — J3089 Other allergic rhinitis: Secondary | ICD-10-CM | POA: Diagnosis not present

## 2020-06-23 DIAGNOSIS — J301 Allergic rhinitis due to pollen: Secondary | ICD-10-CM | POA: Diagnosis not present

## 2020-07-07 ENCOUNTER — Other Ambulatory Visit: Payer: BC Managed Care – PPO | Admitting: Obstetrics and Gynecology

## 2020-07-07 ENCOUNTER — Other Ambulatory Visit: Payer: BC Managed Care – PPO

## 2020-07-07 DIAGNOSIS — J3089 Other allergic rhinitis: Secondary | ICD-10-CM | POA: Diagnosis not present

## 2020-07-07 DIAGNOSIS — J301 Allergic rhinitis due to pollen: Secondary | ICD-10-CM | POA: Diagnosis not present

## 2020-07-14 ENCOUNTER — Ambulatory Visit (INDEPENDENT_AMBULATORY_CARE_PROVIDER_SITE_OTHER): Payer: BLUE CROSS/BLUE SHIELD | Admitting: Professional

## 2020-07-14 DIAGNOSIS — F4322 Adjustment disorder with anxiety: Secondary | ICD-10-CM

## 2020-07-21 ENCOUNTER — Other Ambulatory Visit: Payer: Self-pay

## 2020-07-21 ENCOUNTER — Encounter: Payer: Self-pay | Admitting: Obstetrics and Gynecology

## 2020-07-21 ENCOUNTER — Ambulatory Visit (INDEPENDENT_AMBULATORY_CARE_PROVIDER_SITE_OTHER): Payer: BLUE CROSS/BLUE SHIELD | Admitting: Obstetrics and Gynecology

## 2020-07-21 ENCOUNTER — Ambulatory Visit (INDEPENDENT_AMBULATORY_CARE_PROVIDER_SITE_OTHER): Payer: BLUE CROSS/BLUE SHIELD

## 2020-07-21 VITALS — BP 120/78 | HR 50 | Ht 59.75 in | Wt 168.0 lb

## 2020-07-21 DIAGNOSIS — Z8041 Family history of malignant neoplasm of ovary: Secondary | ICD-10-CM

## 2020-07-21 DIAGNOSIS — J301 Allergic rhinitis due to pollen: Secondary | ICD-10-CM | POA: Diagnosis not present

## 2020-07-21 DIAGNOSIS — J3089 Other allergic rhinitis: Secondary | ICD-10-CM | POA: Diagnosis not present

## 2020-07-21 NOTE — Progress Notes (Signed)
GYNECOLOGY  VISIT   HPI: 65 y.o.   Widowed  Caucasian  female   G51P2011 with Patient's last menstrual period was 12/31/2009 (approximate).   here for pelvic ultrasound.    Mother had ovarian cancer and patient thinks she had negative genetic testing.   Dealing with allergies.  Will receive her allergy injection after her visit here today.   Received her Covid vaccine.   GYNECOLOGIC HISTORY: Patient's last menstrual period was 12/31/2009 (approximate). Contraception: PMP Menopausal hormone therapy:  None Last mammogram: 10-28-19 3D/Neg/density B/BiRads1 Last pap smear:  03-06-17 Neg:Neg HR HPV, 02/09/15 negative, neg HR HPV        OB History    Gravida  3   Para  2   Term  2   Preterm      AB  1   Living  1     SAB      TAB      Ectopic      Multiple      Live Births                 Patient Active Problem List   Diagnosis Date Noted  . Adjustment disorder with mixed anxiety and depressed mood 02/26/2020  . Allergy to mold 01/22/2020  . Stress due to family tension 11/09/2019  . OSA (obstructive sleep apnea) 11/09/2019  . Epigastric pain 01/28/2019  . Gastroesophageal reflux disease with esophagitis 01/28/2019  . Chest pain 01/28/2019  . Newly recognized heart murmur 01/28/2019  . Bradycardia 01/28/2019  . Hematuria 05/27/2018  . Overweight (BMI 25.0-29.9) 05/27/2018  . Rhinitis, allergic 06/11/2016  . Class 1 obesity due to excess calories without serious comorbidity with body mass index (BMI) of 30.0 to 30.9 in adult 01/11/2016  . Squamous cell carcinoma 09/21/2014  . Chronic allergic rhinitis 05/30/2014  . Elevated LDL cholesterol level 04/14/2014  . Allergic rhinitis 04/14/2014  . Seasonal allergies 04/14/2014  . OAB (overactive bladder) 04/14/2014    Past Medical History:  Diagnosis Date  . Allergy    --takes allergy injections  . Hyperlipidemia   . Osteopenia 2020  . Perimenopause     Past Surgical History:  Procedure Laterality Date   . BREAST BIOPSY    . BREAST SURGERY    . DILATION AND CURETTAGE OF UTERUS  10/31/2010   Hysteroscopy with dilation and curettage - Dr. Quincy Simmonds - [facility]  . KNEE SURGERY Bilateral   . REDUCTION MAMMAPLASTY    . REFRACTIVE SURGERY Bilateral     Current Outpatient Medications  Medication Sig Dispense Refill  . azelastine (ASTELIN) 0.1 % nasal spray   5  . calcium carbonate (CALCIUM 600) 600 MG TABS tablet     . Cholecalciferol (D3-1000) 25 MCG (1000 UT) tablet     . Cyanocobalamin (B-12) 1000 MCG TABS     . docusate sodium (COLACE) 100 MG capsule Take 100 mg by mouth 2 (two) times daily.    . fluticasone (FLONASE) 50 MCG/ACT nasal spray Place 2 sprays into both nostrils daily. 16 g 2  . levocetirizine (XYZAL) 5 MG tablet Take 1 tablet (5 mg total) by mouth every evening. 90 tablet 2  . lovastatin (MEVACOR) 40 MG tablet TAKE 1 TABLET (40 MG TOTAL) BY MOUTH AT BEDTIME. 90 tablet 1  . montelukast (SINGULAIR) 10 MG tablet Take one tablet daily. 90 tablet 2  . Multiple Vitamins-Minerals (CENTRUM SILVER 50+WOMEN) TABS     . OVER THE COUNTER MEDICATION Zyflammend - natural anti-inflammatory    .  PROAIR RESPICLICK 528 (90 BASE) MCG/ACT AEPB   3  . traZODone (DESYREL) 50 MG tablet Take 0.5-1 tablets (25-50 mg total) by mouth at bedtime. Needs appt 30 tablet 0  . UNABLE TO FIND Med Name: Allergy Shots     No current facility-administered medications for this visit.     ALLERGIES: Dust mite extract, Molds & smuts, and Tree extract  Family History  Problem Relation Age of Onset  . Hypertension Mother   . Stroke Mother   . Cancer Mother 67       ovarian ca, hx of TTP  . Hyperlipidemia Mother   . Ovarian cancer Mother   . Hypertension Father   . Cancer Father 62       bladder ca  . Hyperlipidemia Father   . Dementia Father        lewy body dementia  . Anemia Sister   . Diabetes Brother   . Mental illness Sister        mentally disabled--lives in group home in [place]  . Asthma  Brother        allergy induced    Social History   Socioeconomic History  . Marital status: Widowed    Spouse name: Not on file  . Number of children: 2  . Years of education: Not on file  . Highest education level: Not on file  Occupational History  . Not on file  Tobacco Use  . Smoking status: Never Smoker  . Smokeless tobacco: Never Used  Vaping Use  . Vaping Use: Never used  Substance and Sexual Activity  . Alcohol use: Yes    Alcohol/week: 2.0 standard drinks    Types: 2 Glasses of wine per week  . Drug use: Never  . Sexual activity: Not Currently    Partners: Male    Birth control/protection: Post-menopausal  Other Topics Concern  . Not on file  Social History Narrative  . Not on file   Social Determinants of Health   Financial Resource Strain:   . Difficulty of Paying Living Expenses:   Food Insecurity:   . Worried About Charity fundraiser in the Last Year:   . Arboriculturist in the Last Year:   Transportation Needs:   . Film/video editor (Medical):   Marland Kitchen Lack of Transportation (Non-Medical):   Physical Activity:   . Days of Exercise per Week:   . Minutes of Exercise per Session:   Stress:   . Feeling of Stress :   Social Connections:   . Frequency of Communication with Friends and Family:   . Frequency of Social Gatherings with Friends and Family:   . Attends Religious Services:   . Active Member of Clubs or Organizations:   . Attends Archivist Meetings:   Marland Kitchen Marital Status:   Intimate Partner Violence:   . Fear of Current or Ex-Partner:   . Emotionally Abused:   Marland Kitchen Physically Abused:   . Sexually Abused:     Review of Systems  All other systems reviewed and are negative.   PHYSICAL EXAMINATION:    BP 120/78 (Cuff Size: Large)   Pulse (!) 50   Ht 4' 11.75" (1.518 m)   Wt 168 lb (76.2 kg)   LMP 12/31/2009 (Approximate)   BMI 33.09 kg/m     General appearance: alert, cooperative and appears stated age   Pelvic US  Uterus  no masses.  EMS 3.73 mm.  Ovaries normal.  No adnexal masses.  No free fluid.   ASSESSMENT  FH ovarian cancer.   PLAN  Reassured regarding normal pelvic ultrasound findings.  Images and report reviewed.  Will check CA125.  We reviewed conditions that can cause elevation of CA125.  If elevated, will refer to GYN ONC. Reviewed signs and symptoms of ovarian cancer.  Fu prn.  23 minute consultation.

## 2020-07-22 ENCOUNTER — Telehealth: Payer: Self-pay | Admitting: Obstetrics and Gynecology

## 2020-07-22 LAB — CA 125: Cancer Antigen (CA) 125: 11.5 U/mL (ref 0.0–38.1)

## 2020-07-22 NOTE — Telephone Encounter (Signed)
Patient is calling to go over test results. She forgot to mention to Dr Quincy Simmonds at [facility] that her grandmother had ovarian cancer.

## 2020-07-22 NOTE — Telephone Encounter (Signed)
MyChart message with results sent to patient by Dr. Quincy Simmonds. Seen by patient Rebekah [name] on 07/22/2020 1:09 PM

## 2020-07-25 ENCOUNTER — Encounter: Payer: Self-pay | Admitting: Obstetrics and Gynecology

## 2020-07-25 NOTE — Telephone Encounter (Signed)
Spoke with patient. Patient has reviewed results and MyChart message from Dr. Quincy Simmonds. Patient wanted to provide update on family hx.   Mother ovarian cancer, "not mutant or genetic kind".  Maternal grandmother ovarian cancer later in life.   Advised I will provide update to Dr. Quincy Simmonds and return call with any additional recommendations. Patient agreeable.   Routing to Dr. Quincy Simmonds for update.

## 2020-07-25 NOTE — Telephone Encounter (Signed)
Left message to call Sharee Pimple, RN at [facility].   Family history updated in [facility].

## 2020-07-25 NOTE — Telephone Encounter (Signed)
I would have the patient consider genetic counseling and testing.   Please add her grandmother's history to her family history in Epic.

## 2020-07-25 NOTE — Telephone Encounter (Signed)
Spoke with patient. Advised per Dr. Quincy Simmonds. Patient declines referral for genetic counselor at this time, she will return call if desires referral.   Advised patient our office is working on Pharmacist, community concerns, Catering manager, Therapist, sports or myself will f/u with more information.   Patient verbalizes understanding and is agreeable.   Routing to provider for final review. Patient is agreeable to disposition. Will close encounter.   Cc: Reesa Chew, RN

## 2020-07-25 NOTE — Telephone Encounter (Signed)
Patient calling to speak to [facility] regarding [employer].

## 2020-08-04 DIAGNOSIS — J301 Allergic rhinitis due to pollen: Secondary | ICD-10-CM | POA: Diagnosis not present

## 2020-08-04 DIAGNOSIS — J3089 Other allergic rhinitis: Secondary | ICD-10-CM | POA: Diagnosis not present

## 2020-08-13 ENCOUNTER — Other Ambulatory Visit: Payer: Self-pay | Admitting: Physician Assistant

## 2020-08-15 DIAGNOSIS — J3089 Other allergic rhinitis: Secondary | ICD-10-CM | POA: Diagnosis not present

## 2020-08-15 DIAGNOSIS — J301 Allergic rhinitis due to pollen: Secondary | ICD-10-CM | POA: Diagnosis not present

## 2020-08-23 DIAGNOSIS — J301 Allergic rhinitis due to pollen: Secondary | ICD-10-CM | POA: Diagnosis not present

## 2020-08-23 DIAGNOSIS — J3089 Other allergic rhinitis: Secondary | ICD-10-CM | POA: Diagnosis not present

## 2020-09-01 DIAGNOSIS — L821 Other seborrheic keratosis: Secondary | ICD-10-CM | POA: Diagnosis not present

## 2020-09-01 DIAGNOSIS — Z85828 Personal history of other malignant neoplasm of skin: Secondary | ICD-10-CM | POA: Diagnosis not present

## 2020-09-01 DIAGNOSIS — L738 Other specified follicular disorders: Secondary | ICD-10-CM | POA: Diagnosis not present

## 2020-09-01 DIAGNOSIS — L819 Disorder of pigmentation, unspecified: Secondary | ICD-10-CM | POA: Diagnosis not present

## 2020-09-12 ENCOUNTER — Other Ambulatory Visit: Payer: Self-pay | Admitting: Neurology

## 2020-09-12 DIAGNOSIS — F5101 Primary insomnia: Secondary | ICD-10-CM

## 2020-09-12 MED ORDER — TRAZODONE HCL 50 MG PO TABS: 25.0000 mg | ORAL_TABLET | Freq: Every day | ORAL | 0 refills | Status: DC

## 2020-09-13 ENCOUNTER — Other Ambulatory Visit: Payer: Self-pay | Admitting: Physician Assistant

## 2020-09-13 ENCOUNTER — Encounter: Payer: Self-pay | Admitting: Physician Assistant

## 2020-09-13 DIAGNOSIS — Z1231 Encounter for screening mammogram for malignant neoplasm of breast: Secondary | ICD-10-CM

## 2020-09-15 DIAGNOSIS — J301 Allergic rhinitis due to pollen: Secondary | ICD-10-CM | POA: Diagnosis not present

## 2020-09-15 DIAGNOSIS — J3089 Other allergic rhinitis: Secondary | ICD-10-CM | POA: Diagnosis not present

## 2020-09-28 ENCOUNTER — Telehealth: Payer: Self-pay | Admitting: Physician Assistant

## 2020-09-28 NOTE — Telephone Encounter (Signed)
Received fax for PA on Levocetirizine Dihydrochloride sent through cover my meds waiting on determination. - CF

## 2020-09-30 ENCOUNTER — Encounter: Payer: BLUE CROSS/BLUE SHIELD | Admitting: Physician Assistant

## 2020-10-03 ENCOUNTER — Encounter: Payer: BLUE CROSS/BLUE SHIELD | Admitting: Physician Assistant

## 2020-10-03 ENCOUNTER — Telehealth: Payer: Self-pay | Admitting: Physician Assistant

## 2020-10-03 DIAGNOSIS — F5101 Primary insomnia: Secondary | ICD-10-CM

## 2020-10-03 MED ORDER — TRAZODONE HCL 50 MG PO TABS: 25.0000 mg | ORAL_TABLET | Freq: Every day | ORAL | 0 refills | Status: DC

## 2020-10-03 NOTE — Telephone Encounter (Signed)
Pt thought her appt was today and she came in. It was cancelled by the automated system, so we rescheduled for 10/21/2020. However she stated concerns about a refill on her trazadone. She is afraid of running out before her next appt.

## 2020-10-03 NOTE — Telephone Encounter (Signed)
Agree with plan 

## 2020-10-03 NOTE — Telephone Encounter (Signed)
1 month RX sent to pharmacy for trazodone.

## 2020-10-04 ENCOUNTER — Other Ambulatory Visit: Payer: Self-pay

## 2020-10-04 ENCOUNTER — Encounter: Payer: Self-pay | Admitting: Physician Assistant

## 2020-10-04 DIAGNOSIS — J309 Allergic rhinitis, unspecified: Secondary | ICD-10-CM

## 2020-10-04 MED ORDER — LEVOCETIRIZINE DIHYDROCHLORIDE 5 MG PO TABS: 5.0000 mg | ORAL_TABLET | Freq: Every evening | ORAL | 2 refills | Status: DC

## 2020-10-04 NOTE — Telephone Encounter (Signed)
Good rx if she gets from [facility] is around 9 dollars for 30 tablets.

## 2020-10-04 NOTE — Telephone Encounter (Signed)
Levocetirizine is not a covered benefit per her plan.

## 2020-10-04 NOTE — Telephone Encounter (Signed)
Patient advised.

## 2020-10-06 DIAGNOSIS — J301 Allergic rhinitis due to pollen: Secondary | ICD-10-CM | POA: Diagnosis not present

## 2020-10-06 DIAGNOSIS — J3089 Other allergic rhinitis: Secondary | ICD-10-CM | POA: Diagnosis not present

## 2020-10-20 DIAGNOSIS — J3089 Other allergic rhinitis: Secondary | ICD-10-CM | POA: Diagnosis not present

## 2020-10-20 DIAGNOSIS — J301 Allergic rhinitis due to pollen: Secondary | ICD-10-CM | POA: Diagnosis not present

## 2020-10-21 ENCOUNTER — Encounter: Payer: Self-pay | Admitting: Physician Assistant

## 2020-10-21 ENCOUNTER — Ambulatory Visit (INDEPENDENT_AMBULATORY_CARE_PROVIDER_SITE_OTHER): Payer: BLUE CROSS/BLUE SHIELD | Admitting: Physician Assistant

## 2020-10-21 ENCOUNTER — Other Ambulatory Visit: Payer: Self-pay

## 2020-10-21 ENCOUNTER — Encounter: Payer: BLUE CROSS/BLUE SHIELD | Admitting: Physician Assistant

## 2020-10-21 VITALS — BP 136/53 | HR 62 | Ht 59.75 in | Wt 177.0 lb

## 2020-10-21 DIAGNOSIS — Z23 Encounter for immunization: Secondary | ICD-10-CM

## 2020-10-21 DIAGNOSIS — Z13220 Encounter for screening for lipoid disorders: Secondary | ICD-10-CM

## 2020-10-21 DIAGNOSIS — F5101 Primary insomnia: Secondary | ICD-10-CM

## 2020-10-21 DIAGNOSIS — E7800 Pure hypercholesterolemia, unspecified: Secondary | ICD-10-CM

## 2020-10-21 DIAGNOSIS — Z1329 Encounter for screening for other suspected endocrine disorder: Secondary | ICD-10-CM

## 2020-10-21 DIAGNOSIS — K2100 Gastro-esophageal reflux disease with esophagitis, without bleeding: Secondary | ICD-10-CM

## 2020-10-21 DIAGNOSIS — Z131 Encounter for screening for diabetes mellitus: Secondary | ICD-10-CM

## 2020-10-21 DIAGNOSIS — Z0000 Encounter for general adult medical examination without abnormal findings: Secondary | ICD-10-CM | POA: Diagnosis not present

## 2020-10-21 DIAGNOSIS — Z6830 Body mass index (BMI) 30.0-30.9, adult: Secondary | ICD-10-CM

## 2020-10-21 DIAGNOSIS — E6609 Other obesity due to excess calories: Secondary | ICD-10-CM

## 2020-10-21 DIAGNOSIS — J310 Chronic rhinitis: Secondary | ICD-10-CM

## 2020-10-21 MED ORDER — TRAZODONE HCL 50 MG PO TABS: 25.0000 mg | ORAL_TABLET | Freq: Every day | ORAL | 4 refills | Status: DC

## 2020-10-21 MED ORDER — AZELASTINE HCL 0.1 % NA SOLN: 2.0000 | Freq: Two times a day (BID) | NASAL | 5 refills | Status: DC

## 2020-10-21 MED ORDER — LOVASTATIN 40 MG PO TABS: 40.0000 mg | ORAL_TABLET | Freq: Every day | ORAL | 4 refills | Status: DC

## 2020-10-21 NOTE — Patient Instructions (Signed)

## 2020-10-24 ENCOUNTER — Encounter: Payer: Self-pay | Admitting: Physician Assistant

## 2020-10-24 DIAGNOSIS — Z20822 Contact with and (suspected) exposure to covid-19: Secondary | ICD-10-CM | POA: Diagnosis not present

## 2020-10-24 NOTE — Progress Notes (Signed)
Subjective:     Rebekah [name] is a 65 y.o. female and is here for a comprehensive physical exam. The patient reports problems - gaining some weight back and wants to get back on track with healthy living. .  Social History   Socioeconomic History  . Marital status: Widowed    Spouse name: Not on file  . Number of children: 2  . Years of education: Not on file  . Highest education level: Not on file  Occupational History  . Not on file  Tobacco Use  . Smoking status: Never Smoker  . Smokeless tobacco: Never Used  Vaping Use  . Vaping Use: Never used  Substance and Sexual Activity  . Alcohol use: Yes    Alcohol/week: 2.0 standard drinks    Types: 2 Glasses of wine per week  . Drug use: Never  . Sexual activity: Not Currently    Partners: Male    Birth control/protection: Post-menopausal  Other Topics Concern  . Not on file  Social History Narrative  . Not on file   Social Determinants of Health   Financial Resource Strain:   . Difficulty of Paying Living Expenses: Not on file  Food Insecurity:   . Worried About Charity fundraiser in the Last Year: Not on file  . Ran Out of Food in the Last Year: Not on file  Transportation Needs:   . Lack of Transportation (Medical): Not on file  . Lack of Transportation (Non-Medical): Not on file  Physical Activity:   . Days of Exercise per Week: Not on file  . Minutes of Exercise per Session: Not on file  Stress:   . Feeling of Stress : Not on file  Social Connections:   . Frequency of Communication with Friends and Family: Not on file  . Frequency of Social Gatherings with Friends and Family: Not on file  . Attends Religious Services: Not on file  . Active Member of Clubs or Organizations: Not on file  . Attends Archivist Meetings: Not on file  . Marital Status: Not on file  Intimate Partner Violence:   . Fear of Current or Ex-Partner: Not on file  . Emotionally Abused: Not on file  . Physically Abused: Not on  file  . Sexually Abused: Not on file   Health Maintenance  Topic Date Due  . MAMMOGRAM  10/27/2021  . DEXA SCAN  10/27/2021  . PAP SMEAR-Modifier  03/06/2022  . TETANUS/TDAP  02/08/2025  . COLONOSCOPY  01/24/2026  . INFLUENZA VACCINE  Completed  . COVID-19 Vaccine  Completed  . Hepatitis C Screening  Completed  . HIV Screening  Completed    The following portions of the patient's history were reviewed and updated as appropriate: allergies, current medications, past family history, past medical history, past social history, past surgical history and problem list.  Review of Systems A comprehensive review of systems was negative.   Objective:    BP (!) 136/53   Pulse 62   Ht 4' 11.75" (1.518 m)   Wt 177 lb (80.3 kg)   LMP 12/31/2009 (Approximate)   SpO2 99%   BMI 34.86 kg/m  General appearance: alert, cooperative, appears stated age and moderately obese Head: Normocephalic, without obvious abnormality, atraumatic Eyes: conjunctivae/corneas clear. PERRL, EOM's intact. Fundi benign. Ears: normal TM's and external ear canals both ears Nose: Nares normal. Septum midline. Mucosa normal. No drainage or sinus tenderness. Throat: lips, mucosa, and tongue normal; teeth and gums normal Neck: no  adenopathy, no carotid bruit, no JVD, supple, symmetrical, trachea midline and thyroid not enlarged, symmetric, no tenderness/mass/nodules Back: symmetric, no curvature. ROM normal. No CVA tenderness. Lungs: clear to auscultation bilaterally Heart: regular rate and rhythm, S1, S2 normal, no murmur, click, rub or gallop Abdomen: soft, non-tender; bowel sounds normal; no masses,  no organomegaly Extremities: extremities normal, atraumatic, no cyanosis or edema Pulses: 2+ and symmetric Skin: Skin color, texture, turgor normal. No rashes or lesions Lymph nodes: Cervical, supraclavicular, and axillary nodes normal. Neurologic: Alert and oriented X 3, normal strength and tone. Normal symmetric  reflexes. Normal coordination and gait    .Marland Kitchen Depression screen [facility] 2/9 10/21/2020 02/22/2020 11/09/2019 01/28/2019 10/28/2017  Decreased Interest 0 3 1 1  0  Down, Depressed, Hopeless 0 3 1 0 0  PHQ - 2 Score 0 6 2 1  0  Altered sleeping - 3 1 2 2   Tired, decreased energy - 3 1 0 3  Change in appetite - 2 1 0 3  Feeling bad or failure about yourself  - 1 1 0 0  Trouble concentrating - 3 0 1 1  Moving slowly or fidgety/restless - 3 0 0 0  Suicidal thoughts - 0 0 0 0  PHQ-9 Score - 21 6 4 9   Difficult doing work/chores - Somewhat difficult Not difficult at all Not difficult at all -   .. GAD 7 : Generalized Anxiety Score 02/22/2020 11/09/2019 01/28/2019  Nervous, Anxious, on Edge 3 1 0  Control/stop worrying 3 1 1   Worry too much - different things 3 1 1   Trouble relaxing 2 1 0  Restless 3 1 0  Easily annoyed or irritable 3 0 1  Afraid - awful might happen 3 0 1  Total GAD 7 Score 20 5 4   Anxiety Difficulty Not difficult at all Not difficult at all Not difficult at all     Assessment:    Healthy female exam.      Plan:     Marland KitchenMarland KitchenMonita was seen today for annual exam.  Diagnoses and all orders for this visit:  Routine physical examination -     CBC with Differential/Platelet -     COMPLETE METABOLIC PANEL WITH GFR -     Lipid Panel w/reflex Direct LDL -     TSH  Gastroesophageal reflux disease with esophagitis without hemorrhage -     CBC with Differential/Platelet -     COMPLETE METABOLIC PANEL WITH GFR -     Lipid Panel w/reflex Direct LDL -     TSH  Screening for diabetes mellitus -     COMPLETE METABOLIC PANEL WITH GFR  Screening for thyroid disorder -     TSH  Screening for lipid disorders -     Lipid Panel w/reflex Direct LDL  Flu vaccine need -     Flu Vaccine QUAD 36+ mos IM  Class 1 obesity due to excess calories without serious comorbidity with body mass index (BMI) of 30.0 to 30.9 in adult  Primary insomnia -     traZODone (DESYREL) 50 MG tablet; Take  0.5-1 tablets (25-50 mg total) by mouth at bedtime.  Chronic rhinitis -     azelastine (ASTELIN) 0.1 % nasal spray; Place 2 sprays into both nostrils 2 (two) times daily.  Elevated LDL cholesterol level -     Lipid Panel w/reflex Direct LDL -     lovastatin (MEVACOR) 40 MG tablet; Take 1 tablet (40 mg total) by mouth at bedtime.   Marland KitchenMarland Kitchen  Discussed 150 minutes of exercise a week.  Encouraged vitamin D 1000 units and Calcium 1300mg  or 4 servings of dairy a day.  PHQ and GAD stable.  Fasting labs ordered.  Flu/covid/shingles UTD.  Colonoscopy UtD.  Mammogram UTD.  Pap UTD.   Marland Kitchen.Discussed low carb diet with 1500 calories and 80g of protein.  Exercising at least 150 minutes a week.  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   See After Visit Summary for Counseling Recommendations

## 2020-11-01 DIAGNOSIS — J3089 Other allergic rhinitis: Secondary | ICD-10-CM | POA: Diagnosis not present

## 2020-11-01 DIAGNOSIS — J301 Allergic rhinitis due to pollen: Secondary | ICD-10-CM | POA: Diagnosis not present

## 2020-11-02 ENCOUNTER — Other Ambulatory Visit: Payer: Self-pay

## 2020-11-02 ENCOUNTER — Ambulatory Visit (INDEPENDENT_AMBULATORY_CARE_PROVIDER_SITE_OTHER): Payer: BLUE CROSS/BLUE SHIELD

## 2020-11-02 DIAGNOSIS — Z1231 Encounter for screening mammogram for malignant neoplasm of breast: Secondary | ICD-10-CM

## 2020-11-04 DIAGNOSIS — Z0000 Encounter for general adult medical examination without abnormal findings: Secondary | ICD-10-CM | POA: Diagnosis not present

## 2020-11-04 DIAGNOSIS — Z131 Encounter for screening for diabetes mellitus: Secondary | ICD-10-CM | POA: Diagnosis not present

## 2020-11-04 DIAGNOSIS — K2100 Gastro-esophageal reflux disease with esophagitis, without bleeding: Secondary | ICD-10-CM | POA: Diagnosis not present

## 2020-11-04 DIAGNOSIS — Z13220 Encounter for screening for lipoid disorders: Secondary | ICD-10-CM | POA: Diagnosis not present

## 2020-11-04 NOTE — Progress Notes (Signed)
Normal mammogram. Follow up in 1 year.

## 2020-11-05 LAB — CBC WITH DIFFERENTIAL/PLATELET
Absolute Monocytes: 431 cells/uL (ref 200–950)
Basophils Absolute: 39 cells/uL (ref 0–200)
Basophils Relative: 0.7 %
Eosinophils Absolute: 112 cells/uL (ref 15–500)
Eosinophils Relative: 2 %
HCT: 40 % (ref 35.0–45.0)
Hemoglobin: 13.5 g/dL (ref 11.7–15.5)
Lymphs Abs: 1646 cells/uL (ref 850–3900)
MCH: 30.7 pg (ref 27.0–33.0)
MCHC: 33.8 g/dL (ref 32.0–36.0)
MCV: 90.9 fL (ref 80.0–100.0)
MPV: 12.4 fL (ref 7.5–12.5)
Monocytes Relative: 7.7 %
Neutro Abs: 3371 cells/uL (ref 1500–7800)
Neutrophils Relative %: 60.2 %
Platelets: 208 10*3/uL (ref 140–400)
RBC: 4.4 10*6/uL (ref 3.80–5.10)
RDW: 12.1 % (ref 11.0–15.0)
Total Lymphocyte: 29.4 %
WBC: 5.6 10*3/uL (ref 3.8–10.8)

## 2020-11-05 LAB — COMPLETE METABOLIC PANEL WITH GFR
AG Ratio: 1.5 (calc) (ref 1.0–2.5)
ALT: 19 U/L (ref 6–29)
AST: 17 U/L (ref 10–35)
Albumin: 4.3 g/dL (ref 3.6–5.1)
Alkaline phosphatase (APISO): 58 U/L (ref 37–153)
BUN: 13 mg/dL (ref 7–25)
CO2: 32 mmol/L (ref 20–32)
Calcium: 9.7 mg/dL (ref 8.6–10.4)
Chloride: 104 mmol/L (ref 98–110)
Creat: 0.7 mg/dL (ref 0.50–0.99)
GFR, Est African American: 106 mL/min/{1.73_m2} (ref >=60)
GFR, Est Non African American: 92 mL/min/{1.73_m2} (ref >=60)
Globulin: 2.8 g/dL (calc) (ref 1.9–3.7)
Glucose, Bld: 89 mg/dL (ref 65–99)
Potassium: 4.3 mmol/L (ref 3.5–5.3)
Sodium: 142 mmol/L (ref 135–146)
Total Bilirubin: 0.5 mg/dL (ref 0.2–1.2)
Total Protein: 7.1 g/dL (ref 6.1–8.1)

## 2020-11-05 LAB — LIPID PANEL W/REFLEX DIRECT LDL
Cholesterol: 223 mg/dL — ABNORMAL HIGH (ref <200)
HDL: 62 mg/dL (ref >=50)
LDL Cholesterol (Calc): 138 mg/dL (calc) — ABNORMAL HIGH
Non-HDL Cholesterol (Calc): 161 mg/dL (calc) — ABNORMAL HIGH (ref <130)
Total CHOL/HDL Ratio: 3.6 (calc) (ref <5.0)
Triglycerides: 114 mg/dL (ref <150)

## 2020-11-05 LAB — TSH: TSH: 2.01 mIU/L (ref 0.40–4.50)

## 2020-11-07 NOTE — Progress Notes (Signed)
Yes, please do so. Ok to refill for 1 year if needed.

## 2020-11-07 NOTE — Progress Notes (Signed)
Chelcee,   LdL up from 1 year ago. HDL still really good. Are you still taking your cholesterol medication?   Thyroid, kidney, liver, glucose, hemoglobin all good.

## 2020-11-08 ENCOUNTER — Ambulatory Visit: Payer: BLUE CROSS/BLUE SHIELD | Admitting: Physician Assistant

## 2020-11-12 DIAGNOSIS — Z20822 Contact with and (suspected) exposure to covid-19: Secondary | ICD-10-CM | POA: Diagnosis not present

## 2020-11-17 ENCOUNTER — Encounter: Payer: Self-pay | Admitting: Physician Assistant

## 2020-11-17 DIAGNOSIS — J309 Allergic rhinitis, unspecified: Secondary | ICD-10-CM

## 2020-11-17 DIAGNOSIS — Z91048 Other nonmedicinal substance allergy status: Secondary | ICD-10-CM

## 2020-11-17 DIAGNOSIS — J302 Other seasonal allergic rhinitis: Secondary | ICD-10-CM

## 2020-11-17 DIAGNOSIS — J301 Allergic rhinitis due to pollen: Secondary | ICD-10-CM | POA: Diagnosis not present

## 2020-11-17 DIAGNOSIS — J3089 Other allergic rhinitis: Secondary | ICD-10-CM | POA: Diagnosis not present

## 2020-11-18 ENCOUNTER — Encounter: Payer: Self-pay | Admitting: Physician Assistant

## 2020-11-18 ENCOUNTER — Encounter: Payer: Self-pay | Admitting: Medical-Surgical

## 2020-11-18 ENCOUNTER — Other Ambulatory Visit: Payer: Self-pay

## 2020-11-18 ENCOUNTER — Telehealth (INDEPENDENT_AMBULATORY_CARE_PROVIDER_SITE_OTHER): Payer: BLUE CROSS/BLUE SHIELD | Admitting: Medical-Surgical

## 2020-11-18 DIAGNOSIS — R059 Cough, unspecified: Secondary | ICD-10-CM

## 2020-11-18 DIAGNOSIS — J0100 Acute maxillary sinusitis, unspecified: Secondary | ICD-10-CM

## 2020-11-18 MED ORDER — AMOXICILLIN-POT CLAVULANATE 875-125 MG PO TABS: 1.0000 | ORAL_TABLET | Freq: Two times a day (BID) | ORAL | 0 refills | Status: DC

## 2020-11-18 MED ORDER — BENZONATATE 200 MG PO CAPS
200.0000 mg | ORAL_CAPSULE | Freq: Three times a day (TID) | ORAL | 0 refills | Status: DC | PRN
Start: 2020-11-18 — End: 2021-02-13

## 2020-11-18 NOTE — Progress Notes (Signed)
Virtual Visit via Telephone   I connected with  Rebekah [name]  on 11/18/20 by telephone/telehealth and verified that I am speaking with the correct person using two identifiers.   I discussed the limitations, risks, security and privacy concerns of performing an evaluation and management service by telephone, including the higher likelihood of inaccurate diagnosis and treatment, and the availability of in person appointments.  We also discussed the likely need of an additional face to face encounter for complete and high quality delivery of care.  I also discussed with the patient that there may be a patient responsible charge related to this service. The patient expressed understanding and wishes to proceed.  Provider location is in medical facility. Patient location is at their home, different from provider location. People involved in care of the patient during this telehealth encounter were myself, my nurse/medical assistant, and my front office/scheduling team member.  CC: Upper respiratory symptoms  HPI: Pleasant 65 year old female presenting today originally via [place] video visit but we were unable to get her audio working despite several minutes of troubleshooting.  We converted our visit to a telephone visit due to those technological difficulties.  Has been experiencing upper respiratory symptoms including dry cough, chest tightness, sinus congestion, green nasal discharge, and headache since 11/10-11.  Notes that her symptoms originally started out and appeared to be cold.  She did get Covid tested on 11/13/2020 with negative results.  She has had both of her Covid vaccines and received her flu shot for the year already.  She has not taken anything for her symptoms until just before our appointment when she took a dose of Delsym.  Because of this, she is unsure if it is helpful or not.  Denies fever, chills, shortness of breath at rest, chest pain and GI symptoms.  Endorses having  difficulty sleeping at night due to her sinus congestion/PND/cough.  Had an old Tessalon Perle prescription from 2019 at home.  She did take 1 of these last night and slept better than she has in a week.  Review of Systems: See HPI for pertinent positives and negatives.   Objective Findings:    General: Speaking full sentences, no audible heavy breathing.  Sounds alert and appropriately interactive.    Impression and Recommendations:    1.  Acute nonrecurrent sinusitis with cough With over a week of symptoms, suspect progression to bacterial infection.  Start Augmentin twice daily x7 days.  Continue Delsym cough syrup if this is helpful.  Sending in [place] 3 times daily as needed for cough.  Increase p.o. fluids.  I discussed the above assessment and treatment plan with the patient. The patient was provided an opportunity to ask questions and all were answered. The patient agreed with the plan and demonstrated an understanding of the instructions.   The patient was advised to call back or seek an in-person evaluation if the symptoms worsen or if the condition fails to improve as anticipated.  20 minutes of non-face-to-face time was provided during this encounter.  Return if symptoms worsen or fail to improve. ___________________________________________ Samuel Bouche, DNP, APRN, FNP-BC [facility] [place]

## 2020-11-21 ENCOUNTER — Ambulatory Visit (INDEPENDENT_AMBULATORY_CARE_PROVIDER_SITE_OTHER): Payer: BLUE CROSS/BLUE SHIELD | Admitting: Physician Assistant

## 2020-11-21 ENCOUNTER — Telehealth: Payer: Self-pay | Admitting: Physician Assistant

## 2020-11-21 ENCOUNTER — Other Ambulatory Visit: Payer: Self-pay

## 2020-11-21 ENCOUNTER — Encounter: Payer: Self-pay | Admitting: Physician Assistant

## 2020-11-21 VITALS — BP 149/62 | HR 56 | Temp 98.5°F | Ht 59.75 in | Wt 175.0 lb

## 2020-11-21 DIAGNOSIS — R058 Other specified cough: Secondary | ICD-10-CM | POA: Diagnosis not present

## 2020-11-21 MED ORDER — HYDROCOD POLST-CPM POLST ER 10-8 MG/5ML PO SUER: 5.0000 mL | Freq: Two times a day (BID) | ORAL | 0 refills | Status: DC | PRN

## 2020-11-21 MED ORDER — PREDNISONE 20 MG PO TABS: ORAL_TABLET | ORAL | 0 refills | Status: DC

## 2020-11-21 NOTE — Telephone Encounter (Signed)
Had a Covid test around the 13th it was negative and then had her a video visit last week. Insurance would not cover [place] medicine that our office called in and her cough is getting worse and she also isnt sure IF someone should listen to her chest? Please call patient with advice

## 2020-11-21 NOTE — Telephone Encounter (Signed)
Ok to double book today.

## 2020-11-21 NOTE — Patient Instructions (Signed)

## 2020-11-21 NOTE — Progress Notes (Signed)
   Subjective:    Patient ID: Rebekah [name], female    DOB: 09-12-1955, 65 y.o.   MRN: [id number]  HPI Pt is a 65 yo female with chronic allergic rhinitis, GERD, multiple allergies who presents to the clinic to discuss ongoing cough. Pt is getting regular allergy shots and on daily allergy medications. She has had URI symptoms for 3 weeks. Her cough is the worst part. Dry cough during the day and at night. Cannot lay down at night without coughing. Saw joy on 11/19. She was given augmentin/tessalon pearls/delsym for sinusitis. No fever, chills, body aches, diarrhea, loss of smell or taste.   Pt has been covid tested and negative.  .. Active Ambulatory Problems    Diagnosis Date Noted  . Elevated LDL cholesterol level 04/14/2014  . Allergic rhinitis 04/14/2014  . Seasonal allergies 04/14/2014  . OAB (overactive bladder) 04/14/2014  . Chronic allergic rhinitis 05/30/2014  . Squamous cell carcinoma 09/21/2014  . Class 1 obesity due to excess calories without serious comorbidity with body mass index (BMI) of 30.0 to 30.9 in adult 01/11/2016  . Rhinitis, allergic 06/11/2016  . Hematuria 05/27/2018  . Overweight (BMI 25.0-29.9) 05/27/2018  . Epigastric pain 01/28/2019  . Gastroesophageal reflux disease with esophagitis 01/28/2019  . Newly recognized heart murmur 01/28/2019  . Bradycardia 01/28/2019  . Stress due to family tension 11/09/2019  . OSA (obstructive sleep apnea) 11/09/2019  . Allergy to mold 01/22/2020  . Adjustment disorder with mixed anxiety and depressed mood 02/26/2020   Resolved Ambulatory Problems    Diagnosis Date Noted  . Chest pain 01/28/2019   Past Medical History:  Diagnosis Date  . Allergy   . Hyperlipidemia   . Osteopenia 2020  . Perimenopause       Review of Systems See HPI.     Objective:   Physical Exam Vitals reviewed.  Constitutional:      Appearance: Normal appearance.  HENT:     Head: Normocephalic.  Cardiovascular:     Rate and Rhythm:  Normal rate and regular rhythm.     Pulses: Normal pulses.     Heart sounds: Normal heart sounds.  Pulmonary:     Effort: Pulmonary effort is normal.     Breath sounds: Normal breath sounds. No wheezing or rhonchi.  Musculoskeletal:     Right lower leg: No edema.     Left lower leg: No edema.  Lymphadenopathy:     Cervical: No cervical adenopathy.  Neurological:     General: No focal deficit present.     Mental Status: She is alert and oriented to person, place, and time.  Psychiatric:        Mood and Affect: Mood normal.           Assessment & Plan:  Marland KitchenMarland KitchenMarthella was seen today for cough.  Diagnoses and all orders for this visit:  Post-viral cough syndrome -     predniSONE (DELTASONE) 20 MG tablet; Take 3 tablets for 3 days, take 2 tablets for 3 days, take 1 tablet for 3 days, take 1/2 tablet for 4 days. -     chlorpheniramine-HYDROcodone ([place]) 10-8 MG/5ML SUER; Take 5 mLs by mouth every 12 (twelve) hours as needed.   No signs of overt infection. Ok to finish augmentin. Added prednisone. Added tussionex. Discussed tessalon pearls should be affordable via [facility]. Discussed acid reflux as another cause for ongoing cough if no improvement then consider adding omeprazole daily. Follow up as needed.

## 2020-12-01 DIAGNOSIS — J3089 Other allergic rhinitis: Secondary | ICD-10-CM | POA: Diagnosis not present

## 2020-12-01 DIAGNOSIS — J301 Allergic rhinitis due to pollen: Secondary | ICD-10-CM | POA: Diagnosis not present

## 2020-12-09 DIAGNOSIS — Z20822 Contact with and (suspected) exposure to covid-19: Secondary | ICD-10-CM | POA: Diagnosis not present

## 2020-12-15 DIAGNOSIS — Z20822 Contact with and (suspected) exposure to covid-19: Secondary | ICD-10-CM | POA: Diagnosis not present

## 2020-12-15 DIAGNOSIS — J301 Allergic rhinitis due to pollen: Secondary | ICD-10-CM | POA: Diagnosis not present

## 2020-12-15 DIAGNOSIS — J3089 Other allergic rhinitis: Secondary | ICD-10-CM | POA: Diagnosis not present

## 2020-12-21 ENCOUNTER — Encounter: Payer: Self-pay | Admitting: Obstetrics and Gynecology

## 2020-12-21 ENCOUNTER — Telehealth: Payer: Self-pay

## 2020-12-21 DIAGNOSIS — Z8041 Family history of malignant neoplasm of ovary: Secondary | ICD-10-CM

## 2020-12-21 NOTE — Telephone Encounter (Signed)
Pt sent the following mychart message: Rebekah [name], Rebekah [name] Rebekah [name] Clinical Pool I know it is the holidays (Merry Christmas and Happy New Year), but I am just now following up on the advise to see about genetic counseling after given this option when I let you know that my grandmother also had ovarian cancer. What is the next step I need to take this action?  Thank you,  [name]

## 2020-12-21 NOTE — Telephone Encounter (Signed)
Routing to Dr Quincy Simmonds, please advise   Referral pended for review

## 2020-12-25 ENCOUNTER — Other Ambulatory Visit: Payer: Self-pay | Admitting: Obstetrics and Gynecology

## 2020-12-25 DIAGNOSIS — Z8041 Family history of malignant neoplasm of ovary: Secondary | ICD-10-CM

## 2020-12-25 NOTE — Telephone Encounter (Signed)
Please let patient know that I am placing a referral for genetics counseling and testing.

## 2020-12-26 ENCOUNTER — Telehealth: Payer: Self-pay | Admitting: Genetic Counselor

## 2020-12-26 NOTE — Telephone Encounter (Signed)
Spoke with patient. Advised per Dr. Edward Jolly. Patient is aware she will be notified of appointment details once appt is scheduled. Patient verbalizes understanding and is agreeable.   Routing to [employer].   Encounter closed.

## 2020-12-26 NOTE — Telephone Encounter (Signed)
Received a genetic counseling referral from Dr. Quincy Simmonds for fhx of ovarian cancer. Pt has been cld and scheduled to see Raquel Sarna on 1/18 at 9am. Pt aware to arrive 30 minutes early.

## 2020-12-31 DIAGNOSIS — Q8789 Other specified congenital malformation syndromes, not elsewhere classified: Secondary | ICD-10-CM

## 2020-12-31 HISTORY — DX: Other specified congenital malformation syndromes, not elsewhere classified: Q87.89

## 2021-01-01 ENCOUNTER — Encounter: Payer: Self-pay | Admitting: Physician Assistant

## 2021-01-02 ENCOUNTER — Encounter (INDEPENDENT_AMBULATORY_CARE_PROVIDER_SITE_OTHER): Payer: Self-pay | Admitting: Otolaryngology

## 2021-01-02 ENCOUNTER — Ambulatory Visit (INDEPENDENT_AMBULATORY_CARE_PROVIDER_SITE_OTHER): Payer: 59 | Admitting: Otolaryngology

## 2021-01-02 ENCOUNTER — Other Ambulatory Visit: Payer: Self-pay

## 2021-01-02 VITALS — Temp 97.5°F

## 2021-01-02 DIAGNOSIS — J342 Deviated nasal septum: Secondary | ICD-10-CM | POA: Diagnosis not present

## 2021-01-02 DIAGNOSIS — J343 Hypertrophy of nasal turbinates: Secondary | ICD-10-CM | POA: Diagnosis not present

## 2021-01-02 DIAGNOSIS — J310 Chronic rhinitis: Secondary | ICD-10-CM | POA: Diagnosis not present

## 2021-01-02 NOTE — Progress Notes (Signed)
HPI: Rebekah [name] is a 66 y.o. female who presents is referred by by her PCP for evaluation of her chronic rhinitis.  Patient has history of allergies and receives allergy shots.  She is presently on Flonase and azelastine nasal sprays.  She still complains of a lot of stuffiness that comes and goes as well as postnasal drainage.  She presents here for evaluation..  Past Medical History:  Diagnosis Date  . Allergy    --takes allergy injections  . Hyperlipidemia   . Osteopenia 2020  . Perimenopause    Past Surgical History:  Procedure Laterality Date  . BREAST BIOPSY    . BREAST SURGERY    . DILATION AND CURETTAGE OF UTERUS  10/31/2010   Hysteroscopy with dilation and curettage - Dr. Edward Jolly - [facility]  . KNEE SURGERY Bilateral   . REDUCTION MAMMAPLASTY    . REFRACTIVE SURGERY Bilateral    Social History   Socioeconomic History  . Marital status: Widowed    Spouse name: Not on file  . Number of children: 2  . Years of education: Not on file  . Highest education level: Not on file  Occupational History  . Not on file  Tobacco Use  . Smoking status: Never Smoker  . Smokeless tobacco: Never Used  Vaping Use  . Vaping Use: Never used  Substance and Sexual Activity  . Alcohol use: Yes    Alcohol/week: 2.0 standard drinks    Types: 2 Glasses of wine per week  . Drug use: Never  . Sexual activity: Not Currently    Partners: Male    Birth control/protection: Post-menopausal  Other Topics Concern  . Not on file  Social History Narrative  . Not on file   Social Determinants of Health   Financial Resource Strain: Not on file  Food Insecurity: Not on file  Transportation Needs: Not on file  Physical Activity: Not on file  Stress: Not on file  Social Connections: Not on file   Family History  Problem Relation Age of Onset  . Hypertension Mother   . Stroke Mother   . Cancer Mother 62       ovarian ca, hx of TTP  . Hyperlipidemia Mother   . Ovarian cancer Mother    . Hypertension Father   . Cancer Father 70       bladder ca  . Hyperlipidemia Father   . Dementia Father        lewy body dementia  . Anemia Sister   . Diabetes Brother   . Mental illness Sister        mentally disabled--lives in group home in [place]  . Asthma Brother        allergy induced  . Ovarian cancer Maternal Grandmother    Allergies  Allergen Reactions  . Dust Mite Extract Other (See Comments)    Patient taking shots for dust mite and trees  . Molds & Smuts Cough  . Tree Extract    Prior to Admission medications   Medication Sig Start Date End Date Taking? Authorizing Provider  amoxicillin-clavulanate (AUGMENTIN) 875-125 MG tablet Take 1 tablet by mouth 2 (two) times daily. 11/18/20   Christen Butter, NP  Ascorbic Acid (VITAMIN C) 100 MG tablet Take 100 mg by mouth daily.    [provider]  azelastine (ASTELIN) 0.1 % nasal spray Place 2 sprays into both nostrils 2 (two) times daily. 10/21/20   Breeback, Jade L, PA-C  benzonatate (TESSALON) 200 MG capsule Take 1  capsule (200 mg total) by mouth 3 (three) times daily as needed for cough. 11/18/20   Samuel Bouche, NP  calcium carbonate (CALCIUM 600) 600 MG TABS tablet     [provider]  chlorpheniramine-HYDROcodone (TUSSIONEX) 10-8 MG/5ML SUER Take 5 mLs by mouth every 12 (twelve) hours as needed. 11/21/20   Donella Stade, PA-C  Cholecalciferol (D3-1000) 25 MCG (1000 UT) tablet     [provider]  Cyanocobalamin (B-12) 1000 MCG TABS     [provider]  fluticasone (FLONASE) 50 MCG/ACT nasal spray Place 2 sprays into both nostrils daily. 03/18/20   Breeback, Jade L, PA-C  levocetirizine (XYZAL) 5 MG tablet Take 1 tablet (5 mg total) by mouth every evening. 10/04/20   Breeback, Jade L, PA-C  lovastatin (MEVACOR) 40 MG tablet Take 1 tablet (40 mg total) by mouth at bedtime. 10/21/20   Breeback, Royetta Car, PA-C  Melatonin 10 MG TABS Take by mouth.    [provider]  montelukast  (SINGULAIR) 10 MG tablet Take one tablet daily. 03/18/20   Breeback, Royetta Car, PA-C  Multiple Vitamins-Minerals (CENTRUM SILVER 50+WOMEN) TABS     [provider]  predniSONE (DELTASONE) 20 MG tablet Take 3 tablets for 3 days, take 2 tablets for 3 days, take 1 tablet for 3 days, take 1/2 tablet for 4 days. 11/21/20   Donella Stade, PA-C  PROAIR RESPICLICK 123XX123 (90 BASE) MCG/ACT AEPB  01/30/15   [provider]  traZODone (DESYREL) 50 MG tablet Take 0.5-1 tablets (25-50 mg total) by mouth at bedtime. 10/21/20   Breeback, Royetta Car, PA-C  UNABLE TO FIND Med Name: Allergy Shots    [provider]     Positive ROS: Otherwise negative  All other systems have been reviewed and were otherwise negative with the exception of those mentioned in the HPI and as above.  Physical Exam: Constitutional: Alert, well-appearing, no acute distress Ears: External ears without lesions or tenderness. Ear canals are clear bilaterally with intact, clear TMs bilaterally.  No middle ear effusion noted. Nasal: External nose without lesions. Septum slightly deviated with edematous erythematous mucosal membranes on both sides..  Nasal endoscopy was performed in the office and on nasal endoscopy she had swollen mucous membranes but there were no polyps and no gross mucopurulent discharge noted in the posterior nasal cavity.  Mildly prominent adenoid tissue was noted.  No polyps or obstructing lesions noted. Oral: Lips and gums without lesions. Tongue and palate mucosa without lesions. Posterior oropharynx clear. Neck: No palpable adenopathy or masses Respiratory: Breathing comfortably  Skin: No facial/neck lesions or rash noted.  Procedures  Assessment: Chronic rhinitis.  On clinical exam no evidence of acute sinus infection.  She does have moderate septal deviation and turbinate hypertrophy.  Plan: Recommended regular use of nasal steroid spray Flonase every night and azelastine once or twice a  day.  Discussed with her concerning use of saline nasal rinses to help with the postnasal drainage and suggested trying Xlear brand. I discussed with her that there is no clinical evidence of active infection requiring antibiotic therapy at this point.  We will continue with allergy treatment. If symptoms worsen could consider CT scan of the sinuses down the road if needed as patient has not had a CT scan of her sinuses.  Surgical intervention would be an option for some of her symptoms however she wants to avoid surgery.   Radene Journey, MD   CC:

## 2021-01-17 ENCOUNTER — Encounter: Payer: Self-pay | Admitting: Licensed Clinical Social Worker

## 2021-01-17 ENCOUNTER — Inpatient Hospital Stay: Payer: 59

## 2021-01-17 ENCOUNTER — Inpatient Hospital Stay: Payer: 59 | Attending: Otolaryngology | Admitting: Licensed Clinical Social Worker

## 2021-01-17 ENCOUNTER — Other Ambulatory Visit: Payer: BLUE CROSS/BLUE SHIELD

## 2021-01-17 DIAGNOSIS — Z8041 Family history of malignant neoplasm of ovary: Secondary | ICD-10-CM | POA: Diagnosis not present

## 2021-01-17 DIAGNOSIS — Z8052 Family history of malignant neoplasm of bladder: Secondary | ICD-10-CM | POA: Diagnosis not present

## 2021-01-17 DIAGNOSIS — Z803 Family history of malignant neoplasm of breast: Secondary | ICD-10-CM | POA: Insufficient documentation

## 2021-01-17 NOTE — Progress Notes (Signed)
REFERRING PROVIDER: Nunzio Cobbs, MD Rebekah [name],  [place] [address]  PRIMARY PROVIDER:  Donella Stade, PA-C  PRIMARY REASON FOR VISIT:  1. Family history of ovarian cancer   2. Family history of breast cancer   3. Family history of bladder cancer     I connected with Rebekah [name] on 01/17/2021 at 9:00 AM EDT by MyChart video conference and verified that I am speaking with the correct person using two identifiers.    Patient location: home Provider location: [place]:   Rebekah [name], a 66 y.o. female, was seen for a [facility] cancer genetics consultation at the request of Dr. Quincy Simmonds due to a family history of cancer.  Rebekah [name] presents to clinic today to discuss the possibility of a hereditary predisposition to cancer, genetic testing, and to further clarify her future cancer risks, as well as potential cancer risks for family members.    Rebekah [name] is a 66 y.o. female with no personal history of cancer.    CANCER HISTORY:  Oncology History   No history exists.     RISK FACTORS:  Menarche was at age 19.  First live birth at age 2.  OCP use for approximately 20 years.  Ovaries intact: yes.  Hysterectomy: no.  Menopausal status: postmenopausal.  Colonoscopy: yes; normal. Mammogram within the last year: yes. Up to date with pelvic exams: yes. Any excessive radiation exposure in the past: no  Past Medical History:  Diagnosis Date  . Allergy    --takes allergy injections  . Family history of bladder cancer   . Family history of breast cancer   . Family history of ovarian cancer   . Hyperlipidemia   . Osteopenia 2020  . Perimenopause     Past Surgical History:  Procedure Laterality Date  . BREAST BIOPSY    . BREAST SURGERY    . DILATION AND CURETTAGE OF UTERUS  10/31/2010   Hysteroscopy with dilation and curettage - Dr. Quincy Simmonds - [facility]  . KNEE SURGERY Bilateral   . REDUCTION  MAMMAPLASTY    . REFRACTIVE SURGERY Bilateral     Social History   Socioeconomic History  . Marital status: Widowed    Spouse name: Not on file  . Number of children: 2  . Years of education: Not on file  . Highest education level: Not on file  Occupational History  . Not on file  Tobacco Use  . Smoking status: Never Smoker  . Smokeless tobacco: Never Used  Vaping Use  . Vaping Use: Never used  Substance and Sexual Activity  . Alcohol use: Yes    Alcohol/week: 2.0 standard drinks    Types: 2 Glasses of wine per week  . Drug use: Never  . Sexual activity: Not Currently    Partners: Male    Birth control/protection: Post-menopausal  Other Topics Concern  . Not on file  Social History Narrative  . Not on file   Social Determinants of Health   Financial Resource Strain: Not on file  Food Insecurity: Not on file  Transportation Needs: Not on file  Physical Activity: Not on file  Stress: Not on file  Social Connections: Not on file     FAMILY HISTORY:  We obtained a detailed, 4-generation family history.  Significant diagnoses are listed below: Family History  Problem Relation Age of Onset  . Hypertension Mother   . Stroke Mother   . Cancer  Mother 79       ovarian ca, hx of TTP  . Hyperlipidemia Mother   . Ovarian cancer Mother   . Hypertension Father   . Cancer Father 11       bladder ca  . Hyperlipidemia Father   . Dementia Father        lewy body dementia  . Anemia Sister   . Diabetes Brother   . Mental illness Sister        mentally disabled--lives in group home in [place]  . Asthma Brother        allergy induced  . Ovarian cancer Maternal Grandmother   . Breast cancer Cousin        dx 78s   Rebekah [name] has a son and a daughter, no cancers. She has 2 brothers and 2 sisters, no cancers.   Rebekah [name] mother was diagnosed with ovarian cancer at 82 and passed in her late 73s-90s. She reportedly did have genetic testing in 2015 for BRCA1/2 that was  normal. Patient had 1 maternal aunt, 1 maternal uncle. A maternal cousin had breast cancer in her 74s. Maternal grandmother possibly had ovarian cancer, but it may have been another type of cancer such as lymphoma or stomach cancer.   Rebekah [name] father was diagnosed with bladder cancer at 83 and is living at 39. She had 3 paternal uncles, 1 paternal aunt. Two of her uncles had cancer, unknown types, and her aunt also had cancer, unknown type. She also has a paternal cousin who had cancer, unknown type. Patient plans to find out more so we can update the family history with cancer types. Paternal grandparents passed in their 35s.   Rebekah [name] is aware of previous family history of genetic testing for hereditary cancer risks. Patient's maternal ancestors are of unknown descent, and paternal ancestors are of unknown descent. There is no reported Ashkenazi Jewish ancestry. There is no known consanguinity.    GENETIC COUNSELING ASSESSMENT: Rebekah [name] is a 66 y.o. female with a family history of ovarian and breast cancer which is somewhat suggestive of a hereditary cancer syndrome and predisposition to cancer. We, therefore, discussed and recommended the following at today's visit.   DISCUSSION: We discussed that approximately 15-20% of ovarian cancer is hereditary  Most cases of hereditary ovarian cancer are associated with BRCA1/BRCA2 genes, although there are other genes associated with hereditary ovarian cancer as well .  We discussed that testing is beneficial for several reasons including  knowing about cancer risks, identifying potential screening and risk-reduction options that may be appropriate, and to understand if other family members could be at risk for cancer and allow them to undergo genetic testing.   We reviewed the characteristics, features and inheritance patterns of hereditary cancer syndromes. We also discussed genetic testing, including the appropriate family members to test, the  process of testing, insurance coverage and turn-around-time for results. We discussed the implications of a negative, positive and/or variant of uncertain significant result. We recommended Rebekah [name] pursue genetic testing for the [facility] CancerNext-Expanded gene panel.   The CancerNext-Expanded + RNAinsight gene panel offered by [employer] and includes sequencing and rearrangement analysis for the following 77 genes: IP, ALK, APC*, ATM*, AXIN2, BAP1, BARD1, BLM, BMPR1A, BRCA1*, BRCA2*, BRIP1*, CDC73, CDH1*,CDK4, CDKN1B, CDKN2A, CHEK2*, CTNNA1, DICER1, FANCC, FH, FLCN, GALNT12, KIF1B, LZTR1, MAX, MEN1, MET, MLH1*, MSH2*, MSH3, MSH6*, MUTYH*, NBN, NF1*, NF2, NTHL1, PALB2*, PHOX2B, PMS2*, POT1, PRKAR1A, PTCH1, PTEN*, RAD51C*, RAD51D*,RB1, RECQL, RET, SDHA, SDHAF2, SDHB, SDHC,  SDHD, SMAD4, SMARCA4, SMARCB1, SMARCE1, STK11, SUFU, TMEM127, TP53*,TSC1, TSC2, VHL and XRCC2 (sequencing and deletion/duplication); EGFR, EGLN1, HOXB13, KIT, MITF, PDGFRA, POLD1 and POLE (sequencing only); EPCAM and GREM1 (deletion/duplication only).   Based on Rebekah [name]'s family history of cancer, she meets medical criteria for genetic testing. Despite that she meets criteria, she may still have an out of pocket cost. We discussed that if her out of pocket cost for testing is over $100, the laboratory will call and confirm whether she wants to proceed with testing.  If the out of pocket cost of testing is less than $100 she will be billed by the genetic testing laboratory.   PLAN: After considering the risks, benefits, and limitations, Rebekah [name] provided informed consent to pursue genetic testing and the blood sample was sent to [facility] for analysis of the CancerNext-Expanded+RNA. Results should be available within approximately 2-3 weeks' time, at which point they will be disclosed by telephone to Rebekah [name], as will any additional recommendations warranted by these results. Rebekah [name] will receive a summary of her  genetic counseling visit and a copy of her results once available. This information will also be available in Epic.   Rebekah [name] questions were answered to her satisfaction today. Our contact information was provided should additional questions or concerns arise. Thank you for the referral and allowing [place] to share in the care of your patient.   Faith Rogue, MS, [facility] Genetic Counselor [email or web address] Phone: [phone]  The patient was seen for a total of 40 minutes in virtual genetic counseling. [facility] intern Fredrik Rigger was also present and assisted with this case.  Dr. Grayland Ormond was available for discussion regarding this case.   _______________________________________________________________________ For Office Staff:  Number of people involved in session: 2 Was an Intern/ student involved with case: yes

## 2021-01-18 ENCOUNTER — Inpatient Hospital Stay: Payer: 59

## 2021-01-18 ENCOUNTER — Other Ambulatory Visit: Payer: Self-pay

## 2021-01-18 LAB — GENETIC SCREENING ORDER

## 2021-01-19 ENCOUNTER — Encounter: Payer: Self-pay | Admitting: Obstetrics and Gynecology

## 2021-01-22 ENCOUNTER — Encounter: Payer: Self-pay | Admitting: Physician Assistant

## 2021-01-30 ENCOUNTER — Ambulatory Visit (INDEPENDENT_AMBULATORY_CARE_PROVIDER_SITE_OTHER): Payer: 59 | Admitting: Otolaryngology

## 2021-02-07 ENCOUNTER — Telehealth: Payer: Self-pay | Admitting: Licensed Clinical Social Worker

## 2021-02-07 NOTE — Telephone Encounter (Signed)
Revealed FLCN mutation identified on the [facility] CancerNext-Expanded+RNA panel. Discussed this result briefly, scheduled appointment to discuss further on 2/14 at 10 am.

## 2021-02-12 ENCOUNTER — Encounter: Payer: Self-pay | Admitting: Physician Assistant

## 2021-02-13 ENCOUNTER — Other Ambulatory Visit: Payer: Self-pay

## 2021-02-13 ENCOUNTER — Inpatient Hospital Stay: Payer: 59 | Attending: Otolaryngology | Admitting: Licensed Clinical Social Worker

## 2021-02-13 ENCOUNTER — Other Ambulatory Visit: Payer: Self-pay | Admitting: *Deleted

## 2021-02-13 DIAGNOSIS — F5101 Primary insomnia: Secondary | ICD-10-CM

## 2021-02-13 DIAGNOSIS — Z803 Family history of malignant neoplasm of breast: Secondary | ICD-10-CM | POA: Diagnosis not present

## 2021-02-13 DIAGNOSIS — Z8041 Family history of malignant neoplasm of ovary: Secondary | ICD-10-CM

## 2021-02-13 DIAGNOSIS — Z1379 Encounter for other screening for genetic and chromosomal anomalies: Secondary | ICD-10-CM

## 2021-02-13 DIAGNOSIS — Q8789 Other specified congenital malformation syndromes, not elsewhere classified: Secondary | ICD-10-CM | POA: Diagnosis not present

## 2021-02-13 DIAGNOSIS — Z8052 Family history of malignant neoplasm of bladder: Secondary | ICD-10-CM

## 2021-02-13 MED ORDER — TRAZODONE HCL 50 MG PO TABS: 25.0000 mg | ORAL_TABLET | Freq: Every day | ORAL | 4 refills | Status: DC

## 2021-02-13 NOTE — Progress Notes (Addendum)
Genetic Test Results  I connected with Ms. Boyington on 02/13/2021 at 10 AM EDT by MyChart video conference and verified that I am speaking with the correct person using two identifiers.    Patient location: home Provider location: [facility]   HPI:  Ms. Lightcap was previously seen in the [facility] due to a family history of cancer and concerns regarding a hereditary predisposition to cancer. Please refer to our prior cancer genetics clinic note for more information regarding our discussion, assessment and recommendations, at the time. Ms. Kief recent genetic test results were disclosed to her, as were recommendations warranted by these results. These results and recommendations are discussed in more detail below.  CANCER HISTORY:  Oncology History   No history exists.    FAMILY HISTORY:  We obtained a detailed, 4-generation family history.  Significant diagnoses are listed below: Family History  Problem Relation Age of Onset  . Hypertension Mother   . Stroke Mother   . Cancer Mother 76       ovarian ca, hx of TTP  . Hyperlipidemia Mother   . Ovarian cancer Mother   . Hypertension Father   . Cancer Father 89       bladder ca  . Hyperlipidemia Father   . Dementia Father        lewy body dementia  . Anemia Sister   . Diabetes Brother   . Mental illness Sister        mentally disabled--lives in group home in [place]  . Asthma Brother        allergy induced  . Ovarian cancer Maternal Grandmother   . Breast cancer Cousin        dx 41s   Ms. Calkin has a son and a daughter, no cancers. She has 2 brothers and 2 sisters, no cancers.   Ms. Mcnicholas mother was diagnosed with ovarian cancer at 26 and passed in her late 11s-90s. She reportedly did have genetic testing in 2015 for BRCA1/2 that was normal. Patient had 1 maternal aunt, 1 maternal uncle. A maternal cousin had breast cancer in her 101s. Maternal grandmother possibly had ovarian cancer, but  it may have been another type of cancer such as lymphoma or stomach cancer.   Ms. Fehrenbach father was diagnosed with bladder cancer at 72 and is living at 29. She had 3 paternal uncles, 1 paternal aunt. Two of her uncles had cancer, unknown types, and her aunt also had cancer, unknown type. She also has a paternal cousin who had cancer, unknown type. Patient plans to find out more so we can update the family history with cancer types. Paternal grandparents passed in their 66s.   Ms. Depace is aware of previous family history of genetic testing for hereditary cancer risks. Patient's maternal ancestors are of unknown descent, and paternal ancestors are of unknown descent. There is no reported Ashkenazi Jewish ancestry. There is no known consanguinity.    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 The remainder of testing was negative.   The CancerNext-Expanded + RNAinsight gene panel offered by [employer] and includes sequencing and rearrangement analysis for the following 77 genes: IP, ALK, APC*, ATM*, AXIN2, BAP1, BARD1, BLM, BMPR1A, BRCA1*, BRCA2*, BRIP1*, CDC73, CDH1*,CDK4, CDKN1B, CDKN2A, CHEK2*, CTNNA1, DICER1, FANCC, FH, FLCN, GALNT12, KIF1B, LZTR1, MAX, MEN1, MET, MLH1*, MSH2*, MSH3, MSH6*, MUTYH*, NBN, NF1*, NF2, NTHL1, PALB2*, PHOX2B,  PMS2*, POT1, PRKAR1A, PTCH1, PTEN*, RAD51C*, RAD51D*,RB1, RECQL, RET, SDHA, SDHAF2, SDHB, SDHC, SDHD, SMAD4, SMARCA4, SMARCB1, SMARCE1, STK11, SUFU, TMEM127, TP53*,TSC1, TSC2, VHL and XRCC2 (sequencing and deletion/duplication); EGFR, EGLN1, HOXB13, KIT, MITF, PDGFRA, POLD1 and POLE (sequencing only); EPCAM and GREM1 (deletion/duplication only).   The test report has been scanned into EPIC and is located under the Molecular Pathology section of the Results Review tab.  A portion of the result report is included below for  reference.     We discussed with Ms. Boughner that because current genetic testing is not perfect, it is possible there may be a gene mutation in one of these genes that current testing cannot detect, but that chance is small.  We also discussed, that there could be another gene that has not yet been discovered, or that we have not yet tested, that is responsible for the cancer diagnoses in the family. It is also possible there is a hereditary cause for the cancer in the family that Ms. Roeder did not inherit and therefore was not identified in her testing.  Therefore, it is important to remain in touch with cancer genetics in the future so that we can continue to offer Ms. Berkey the most up to date genetic testing.   FLCN (Birt-Hogg-Dube)  Clinical condition Birt-Hogg-Dub syndrome (BHD) is a highly variable condition characterized by the development of cutaneous lesions, renal tumors, pulmonary cysts, and/or spontaneous pneumothorax (PMID: 42595638, 75643329, 51884166).  Skin: The hallmark skin findings associated with BHD are characterized as a triad of fibrofolliculomas,  trichodiscomas, and acrochordons (also known as skin tags)(PMID: 06301601, 09323557). Fibrofolliculomas are benign, painless hair follicle tumors that develop after puberty and are present in over 85% of affected adults over age 82 (PMID: 32202542, 70623762). These are firm, white or flesh-colored papules with a dome-shaped appearance measuring approximately 2 to 4 mm in diameter (PMID: 83151761, 60737106). Fibrofolliculomas primarily develop on the face, neck, and upper torso, and sometimes form in clusters, coalescing into a plaque (PMID: 26948546, 27035009, 38182993).  Trichodiscomas are tumors of the hair disks (PMID: 71696789). Trichodiscomas are essentially histologically and clinically indistinguishable from angiofibromas and the term trichodiscoma is typically used when related to [place]. In addition, tricodisocmas have been  suggested to be part of the same morphologic spectrum as fibrofolliculomas (PMID: 38101751, 02585277). Sectioning these lesions in different planes appears to result in artificial differences in histologic variation (PMID: 82423536).  Perifollicular fibromas and angiofibromas are also associated with BHD. Other less common skin findings include lipomas, angiolipomas, oral mucosal papules and fibromas, melanoma, and collagenoma (PMID: 14431540, 08676195). The onset of skin lesions is typically during the third or fourth decade of life and typically increase in size and number with age. Later onset is correlated with a milder skin phenotype and women are often less affected than men.  Lungs: Pulmonary involvement is common in BHD, manifesting as pulmonary cysts and recurrent pneumothoraces (PMID: 09326712). Multiple and bilateral pulmonary cysts are seen in 77-89% of affected individuals, most often identified in the fourth and fifth decades of life (PMID: 45809983, 38250539). The number of cysts is highly variable with sizes ranging from a few millimeters to 2 cm (PMID: 76734193, 79024097). They are typically subpleural in distribution and often irregularly shaped, with sharply demarcated, thin walls (PMID: 35329924, 26834196). BHD-related pulmonary cysts are  also typically located in the basilar and mediastinal regions of the lung, helping distinguish them from other cystic lesions such as apical blebs and bullae (PMID: 22297989, 21194174, 08144818). BHD-related pulmonary  lesions are not associated with neoplasia, inflammation, or fibrosis (PMID: 33825053, 97673419). Individuals with BHD typically function well in the absence of pneumothorax (PMID: 37902409). Clinically, they are often asymptomatic, with pulmonary function tests that are typically normal or only minimally impaired (PMID: 73532992, 42683419). Some symptoms can include dyspnea, cough, or angina, which are more often seen in older individuals and  those with more severe lung involvement (PMID: 62229798). The role of cigarette smoking remains unclear, although more severe cystic changes have been reported in smokers with BHD compared to nonsmokers in a small case series (PMID: 92119417, 40814481).  BHD is also associated with a 50-fold increased risk of spontaneous pneumothorax compared to the general population, correlating inversely with age (PMID: 85631497, 02637858, 85027741). Approximately 24-38% of individuals BHD experience at least one pneumothorax, with a median age of 51 years; 75% of affected individuals will experience a recurrent event (PMID: 28786767, 20947096). The number, size, and volume of pulmonary cysts appear to be risk factors for pneumothorax; however, events have been reported in the absence of radiographically detectable cysts (PMID: 28366294, 76546503). Pneumothorax may be the only manifestation of BHD cysts (PMID: 54656812). Some studies have suggested that 5-10% of apparently sporadic pneumothoraces may be due to underlying BHD (PMID: 75170017). Lower atmospheric pressure when flying in a pressurized cabin may potentially result in gas expansion within pulmonary cysts, causing a pneumothorax (PMID: 49449675). In general, air travel is considered safe; however, it has been suggested that affected individuals not board an airplane with unexplained chest pain or shortness of breath (PMID: 91638466). Likewise, those with BHD are advised against scuba diving, as changes in ambient atmospheric pressure could predispose to the develop pneumothoraces (PMID: 59935701).  Kidneys: The prevalence of malignant renal tumors in BHD is 12-34%, with an average age of onset between the ages of 13-52 years (PMID: 77939030, 09233007, 62263335). These lesions tend to be bilateral, multifocal, and slow-growing (PMID: 45625638, 93734287). While metastasis can occur, it is relatively infrequent (PMID: 68115726, 20355974). Renal cancer types include  (PMID: 16384536, 46803212, 24825003, 70488891):  hybrid oncocytic tumor (hybrid of oncocytoma and chromophobe histologic cell types) (50-67%) chromophobe renal cell carcinoma (23-35%) clear cell renal carcinoma (9%) renal cell oncocytomas (3-5%) papillary renal carcinoma (2%)  Benign renal cysts have been documented in BHD, including a [place] study which identified renal cysts without carcinoma using CT, MRI, and ultrasound in 45% of affected individuals (PMID: 69450388), however the exact prevalence in comparison to the general population is currently unclear (PMID: 82800349). Renal angiomyolipoma has also been observed (PMID: 17915056).  Other findings: Other less commonly reported findings associated with BHD include lipoma, multinodular goiter, parathyroid adenoma, parotid-gland adenoma, neural tissue tumor, trichoblastoma, connective tissue nevus, focal cutaneous mucinosis, and cutaneous leiomyoma (PMID: 97948016, 55374827).  There is also preliminary evidence suggesting an association between pathogenic variants in FLCN and colorectal cancer, and this gene is therefore available as a "preliminary evidence" gene on [facility]'s Colorectal Cancer Panel (PMID: 07867544, 92010071, 21975883). Preliminary evidence genes are selected from an extensive review of the literature and expert recommendations, but the association between the gene and the specific condition has not been completely established. This uncertainty may be resolved as new information becomes available and therefore clinicians may continue to order these preliminary evidence genes.  Inheritance FLCN has autosomal dominant inheritance. This means that an individual with a pathogenic variant has a 50% chance of passing the condition on to their offspring. With this result, it is now possible to identify at-risk relatives who can  pursue testing for this specific familial variant.  Management Due to the complexity of BHD, affected  individuals are best served by seeking care through a multidisciplinary clinic with experience treating this condition. Early diagnosis allows immediate implementation of surveillance and management. While there is not a consensus on standard guidelines for surveillance, the following have been recommended, in addition to annual physical examinations (PMID: 76160737, 10626948, 54627035):  Skin (PMID: 00938182, 99371696):  -No specific medical treatment is indicated for BHD-related cutaneous lesions -Occasionally, intervention may be considered for disfiguring lesions; Surgical removal for solitary fibrofolliculomas, Electrodessication for removing multiple lesions -With any dermatologic  intervention, patients should consider the high recurrence of lesions and the risk of complications such as scarring, inflammation, hypopigmentation, and hyperpigmentation  Patient does see dermatologist annually (Dr. Marilynne Halsted at [facility] dermatology). We will copy our note to her practice.   Lungs:  Pneumothorax prevention and treatment are the primary goals (PMID: 78938101):  -Baseline high-resolution chest CT -Regular follow-up with pulmonologist, including periodic measurement of pulmonary function if lung function is impaired -Education regarding increased risk of spontaneous pneumothorax, and associated signs and symptoms -Avoidance of cigarette smoking and scuba diving -Caution about the increased risk of pneumothorax associated with air travel -In general, air travel is considered safe for most with BHD -Regular immunizations are recommended as well as pneumococcal and annual influenza vaccinations -Consideration of pleurodesis for treatment of the first pneumothorax  Kidneys (PMID: 75102585, 27782423):  -Abdominal imaging at least every 36-48 months, beginning at age 49 -Abdominal CT or MRI with contrast is preferred, as renal ultrasonography may not detect small lesions -MRI imaging is  preferred to reduce long-term radiation exposure -Imaging intervals are determined by the size and growth rate of any identified renal tumors -Surgical intervention when the largest tumor reaches 3 cm in size -Nephron-sparing resection is advised to preserve renal function  An individual's cancer risk and medical management are not determined by genetic test results alone. Overall cancer risk assessment incorporates additional factors including personal medical history, family history, as well as available genetic information that may result in a personalized plan for cancer prevention and surveillance.  It is advantageous to know if a pathogenic variant in FLCN is present. At-risk relatives can be identified, allowing pursuit of a diagnostic evaluation. In addition, the available information regarding FLCN is constantly evolving and more clinically relevant data is likely to become available in the near future. Awareness of this cancer predisposition allows patients and their providers to be vigilant in maintaining close and regular contact with their local genetics clinic in anticipation of new information, inform at-risk family members, and diligently follow condition-specific screening protocols.   Additionally, we ran a risk model for breast cancer since this result does not explain her family history of cancer.    Based on Ms. Severino's personal and family history of cancer as well as her genetic test results, risk model Harriett Rush was used to estimate her risk of developing breast cancer. This estimates her lifetime risk of developing breast cancer to be approximately 7.9%.  The patient's lifetime breast cancer risk is a preliminary estimate based on available information using one of several models endorsed by the [place] (ACS). The ACS recommends consideration of breast MRI screening as an adjunct to mammography for patients at high risk (defined as 20% or greater lifetime  risk).      FAMILY MEMBERS: It is important that all of Ms. Parlow's relatives (both men and women) know of the  presence of this gene mutation. Site-specific genetic testing can sort out who in the family is at risk and who is not.   Ms. Signore children and siblings have a 50% chance to have inherited this mutation. We recommend they have genetic testing for this same mutation, as identifying the presence of this mutation would allow them to also take advantage of risk-reducing measures.   Relatives in this family might be at some increased risk of developing cancer, over the general population risk, simply due to the family history of cancer.  We recommended female relatives in this family have a yearly mammogram beginning at age 35, or 28 years younger than the earliest onset of cancer, an annual clinical breast exam, and perform monthly breast self-exams. Female relatives in this family should also have a gynecological exam as recommended by their primary provider.  All family members should be referred for colonoscopy starting at age 42.   It is also possible there is a hereditary cause for the cancer in Ms. Abdelrahman's family that she did not inherit and therefore was not identified in her.  Based on Ms. Citro's family history, we recommended maternal relatives have genetic counseling and testing. Ms. Musick will let us know if we can be of any assistance in coordinating genetic counseling and/or testing for these family members.  PLAN:   1. These results will be made available to her referring provider, Dr. Quincy Simmonds, her PCP, Iran Planas PA-C and her dermatologist. She would like her PCP to follow her long-term for this indication and coordinate screening/prophylactic surgeries. We discussed [facility] and [facility] as practices that could help with her screening.    2. Ms. Spillane plans to discuss these results with her family and will reach out to [place] if we can be of any  assistance in coordinating genetic testing for any of her relatives.    SUPPORT AND RESOURCES: If Ms. Newland is interested in FLCN-specific information and support, there is a group, the [place],  which some people have found useful thelispenard.com. To locate genetic counselors in other cities, visit the website of the [employer] (ArtistMovie.se) and Secretary/administrator for a Social worker by zip code.  We encouraged Ms. Criswell  to remain in contact with [place] on an annual basis so we can update  personal and family histories, and let her know of advances in cancer genetics that may benefit the family. Our contact number was provided. Ms. Adel questions were answered to her satisfaction today, and she knows she is welcome to call anytime with additional questions.   Faith Rogue, MS, [facility] Genetic Counselor [email or web address] Phone: [phone]  The patient was seen for a total of 35 minutes in virtual genetic counseling. [facility] intern Fredrik Rigger was also present and assisted. Dr. Grayland Ormond was available for discussion regarding this case.   _______________________________________________________________________ For Office Staff:  Number of people involved in session: 2 Was an Intern/ student involved with case: yes

## 2021-03-17 ENCOUNTER — Encounter: Payer: Self-pay | Admitting: Physician Assistant

## 2021-03-25 ENCOUNTER — Encounter: Payer: Self-pay | Admitting: Physician Assistant

## 2021-04-10 DIAGNOSIS — J301 Allergic rhinitis due to pollen: Secondary | ICD-10-CM | POA: Diagnosis not present

## 2021-04-10 DIAGNOSIS — J3089 Other allergic rhinitis: Secondary | ICD-10-CM | POA: Diagnosis not present

## 2021-04-17 ENCOUNTER — Other Ambulatory Visit: Payer: Self-pay | Admitting: Neurology

## 2021-04-17 DIAGNOSIS — J309 Allergic rhinitis, unspecified: Secondary | ICD-10-CM

## 2021-04-17 MED ORDER — LEVOCETIRIZINE DIHYDROCHLORIDE 5 MG PO TABS: 5.0000 mg | ORAL_TABLET | Freq: Every evening | ORAL | 0 refills | Status: DC

## 2021-04-18 DIAGNOSIS — Z20822 Contact with and (suspected) exposure to covid-19: Secondary | ICD-10-CM | POA: Diagnosis not present

## 2021-04-25 DIAGNOSIS — J301 Allergic rhinitis due to pollen: Secondary | ICD-10-CM | POA: Diagnosis not present

## 2021-04-25 DIAGNOSIS — J3089 Other allergic rhinitis: Secondary | ICD-10-CM | POA: Diagnosis not present

## 2021-05-08 ENCOUNTER — Other Ambulatory Visit: Payer: Self-pay

## 2021-05-08 ENCOUNTER — Encounter: Payer: Self-pay | Admitting: Sports Medicine

## 2021-05-08 ENCOUNTER — Ambulatory Visit (INDEPENDENT_AMBULATORY_CARE_PROVIDER_SITE_OTHER): Payer: Medicare Other

## 2021-05-08 ENCOUNTER — Ambulatory Visit (INDEPENDENT_AMBULATORY_CARE_PROVIDER_SITE_OTHER): Payer: Medicare Other | Admitting: Sports Medicine

## 2021-05-08 DIAGNOSIS — M5030 Other cervical disc degeneration, unspecified cervical region: Secondary | ICD-10-CM

## 2021-05-08 DIAGNOSIS — J301 Allergic rhinitis due to pollen: Secondary | ICD-10-CM | POA: Diagnosis not present

## 2021-05-08 DIAGNOSIS — M5136 Other intervertebral disc degeneration, lumbar region: Secondary | ICD-10-CM

## 2021-05-08 DIAGNOSIS — M51369 Other intervertebral disc degeneration, lumbar region without mention of lumbar back pain or lower extremity pain: Secondary | ICD-10-CM | POA: Insufficient documentation

## 2021-05-08 DIAGNOSIS — J3089 Other allergic rhinitis: Secondary | ICD-10-CM | POA: Diagnosis not present

## 2021-05-08 DIAGNOSIS — M542 Cervicalgia: Secondary | ICD-10-CM | POA: Diagnosis not present

## 2021-05-08 DIAGNOSIS — M545 Low back pain, unspecified: Secondary | ICD-10-CM | POA: Diagnosis not present

## 2021-05-08 MED ORDER — PREDNISONE 50 MG PO TABS: ORAL_TABLET | ORAL | 0 refills | Status: DC

## 2021-05-08 NOTE — Assessment & Plan Note (Signed)
This is a very pleasant 66 year old female, she has long history of neck and back pain, on and off, she is having a worsening of pain lately, axial, right-sided trapezial. Her low back pain is in the midline of the lower lumbar spine. We discussed the natural history as well as the developmental and evolutionary anthropology of low back pain, we will work with 5 days of prednisone, formal physical therapy, as well as x-rays of the cervical and lumbar spine for Return to see me in 6 weeks, MRI if no better.

## 2021-05-08 NOTE — Assessment & Plan Note (Signed)
This is a very pleasant 66-year-old female, she has long history of neck and back pain, on and off, she is having a worsening of pain lately, axial, right-sided trapezial. Her low back pain is in the midline of the lower lumbar spine. We discussed the natural history as well as the developmental and evolutionary anthropology of low back pain, we will work with 5 days of prednisone, formal physical therapy, as well as x-rays of the cervical and lumbar spine for Return to see me in 6 weeks, MRI if no better. 

## 2021-05-08 NOTE — Progress Notes (Signed)
    Procedures performed today:    None.  Independent interpretation of notes and tests performed by another provider:   None.  Brief History, Exam, Impression, and Recommendations:    DDD (degenerative disc disease), cervical This is a very pleasant 66 year old female, she has long history of neck and back pain, on and off, she is having a worsening of pain lately, axial, right-sided trapezial. Her low back pain is in the midline of the lower lumbar spine. We discussed the natural history as well as the developmental and evolutionary anthropology of low back pain, we will work with 5 days of prednisone, formal physical therapy, as well as x-rays of the cervical and lumbar spine for Return to see me in 6 weeks, MRI if no better.  DDD (degenerative disc disease), lumbar This is a very pleasant 66 year old female, she has long history of neck and back pain, on and off, she is having a worsening of pain lately, axial, right-sided trapezial. Her low back pain is in the midline of the lower lumbar spine. We discussed the natural history as well as the developmental and evolutionary anthropology of low back pain, we will work with 5 days of prednisone, formal physical therapy, as well as x-rays of the cervical and lumbar spine for Return to see me in 6 weeks, MRI if no better.    ___________________________________________ Gwen Her. Dianah Field, M.D., ABFM., CAQSM. Primary Care and [place] Instructor of [place] of [facility] of Medicine

## 2021-05-12 ENCOUNTER — Telehealth: Payer: Self-pay | Admitting: Genetic Counselor

## 2021-05-12 NOTE — Telephone Encounter (Signed)
There was a charge for the [facility] visit in Feb and per [facility] it should be covered.  She would like [place] to resubmit the charges to [employer]  I will ask that this be done.

## 2021-05-17 ENCOUNTER — Encounter: Payer: Self-pay | Admitting: Physical Therapy

## 2021-05-17 ENCOUNTER — Ambulatory Visit (INDEPENDENT_AMBULATORY_CARE_PROVIDER_SITE_OTHER): Payer: Medicare Other | Admitting: Physical Therapy

## 2021-05-17 ENCOUNTER — Other Ambulatory Visit: Payer: Self-pay

## 2021-05-17 DIAGNOSIS — M545 Low back pain, unspecified: Secondary | ICD-10-CM | POA: Diagnosis not present

## 2021-05-17 DIAGNOSIS — R6889 Other general symptoms and signs: Secondary | ICD-10-CM

## 2021-05-17 DIAGNOSIS — M6281 Muscle weakness (generalized): Secondary | ICD-10-CM

## 2021-05-17 DIAGNOSIS — G8929 Other chronic pain: Secondary | ICD-10-CM

## 2021-05-17 DIAGNOSIS — R293 Abnormal posture: Secondary | ICD-10-CM

## 2021-05-17 NOTE — Therapy (Signed)
[place] [address] [place], [place], [address] Phone: [phone]   Fax:  [phone]  Physical Therapy Evaluation  Patient Details  Name: Rebekah [name] MRN: [id number] Date of Birth: 1955/04/30 Referring Provider (PT): thekkekandam   Encounter Date: 05/17/2021   PT End of Session - 05/17/21 0800    Visit Number 1    Number of Visits 12    Date for PT Re-Evaluation 06/28/21    PT Start Time 0718    PT Stop Time 0800    PT Time Calculation (min) 42 min    Activity Tolerance Patient tolerated treatment well    Behavior During Therapy [facility] for tasks assessed/performed           Past Medical History:  Diagnosis Date  . Allergy    --takes allergy injections  . Family history of bladder cancer   . Family history of breast cancer   . Family history of ovarian cancer   . Hyperlipidemia   . Osteopenia 2020  . Perimenopause     Past Surgical History:  Procedure Laterality Date  . BREAST BIOPSY    . BREAST SURGERY    . DILATION AND CURETTAGE OF UTERUS  10/31/2010   Hysteroscopy with dilation and curettage - Dr. Quincy Simmonds - [facility]  . KNEE SURGERY Bilateral   . REDUCTION MAMMAPLASTY    . REFRACTIVE SURGERY Bilateral     There were no vitals filed for this visit.    Subjective Assessment - 05/17/21 0721    Subjective Pt has been having back pain off and on for years, has done chiropractic treatment throughout the years but finally decided to go to MD. MD recommended meds and PT.  Pain in mid thoracic and low back. Pain increases with bending, cleaning the house, slouched posture, eases with massage and stretching.    Limitations Sitting;Standing    How long can you stand comfortably? 20-30 minutes    Patient Stated Goals decrease pain    Currently in Pain? Yes    Pain Score 2     Pain Location Back    Pain Orientation Upper;Medial    Pain Descriptors / Indicators Tightness    Pain Type Chronic pain     Pain Onset More than a month ago    Pain Frequency Intermittent    Aggravating Factors  squat, bend, slouch    Pain Relieving Factors massage, stretch    Effect of Pain on Daily Activities more frequent rest breaks during IADLs              [facility] PT Assessment - 05/17/21 0001      Assessment   Medical Diagnosis DDD cervical and lumbar    Referring Provider (PT) thekkekandam      Balance Screen   Has the patient fallen in the past 6 months No      Prior Function   Level of Independence Independent      Observation/Other Assessments   Cranial Nerve(s) --    Focus on Therapeutic Outcomes (FOTO)  47 functional status measure      Posture/Postural Control   Posture Comments increased thoracic kyphosis      ROM / Strength   AROM / PROM / Strength AROM;Strength      AROM   AROM Assessment Site Cervical;Lumbar    Cervical Flexion 45    Cervical Extension 40    Cervical - Right Side Bend 25    Cervical - Left Side Bend 30  Cervical - Right Rotation 35    Cervical - Left Rotation 50    Lumbar Flexion limited 25%    Lumbar Extension WFL    Lumbar - Right Side Bend limited 25%    Lumbar - Left Side Bend WFL    Lumbar - Right Rotation WFL    Lumbar - Left Rotation [facility]      Strength   Overall Strength Comments shoulder strength 4+/5 bilat    Strength Assessment Site Hip    Right/Left Hip Right;Left    Right Hip Flexion 4+/5    Right Hip ABduction 4+/5    Left Hip Flexion 4+/5    Left Hip ABduction 4/5      Flexibility   Soft Tissue Assessment /Muscle Length yes    Hamstrings decreased bilat      Palpation   SI assessment  hypomobile    Palpation comment TTP Rt border of sacrum, upper thoracic vertebrae hypomobile      Special Tests    Special Tests Lumbar    Lumbar Tests Straight Leg Raise;FABER test      FABER test   findings Negative    Comment bilat      Straight Leg Raise   Findings Negative    Comment bilat                       Objective measurements completed on examination: See above findings.       [facility] Adult PT Treatment/Exercise - 05/17/21 0001      Exercises   Exercises Lumbar;Neck      Neck Exercises: Theraband   Horizontal ABduction 10 reps;Red    Other Theraband Exercises Ws x 10 red TB      Lumbar Exercises: Stretches   Passive Hamstring Stretch 20 seconds;3 reps    Quadruped Mid Back Stretch 5 reps;10 seconds    Quadruped Mid Back Stretch Limitations cat/camel                  PT Education - 05/17/21 0753    Education Details HEP, PT POC and goals    Person(s) Educated Patient    Methods Explanation;Demonstration;Handout    Comprehension Verbalized understanding;Returned demonstration               PT Long Term Goals - 05/17/21 0834      PT LONG TERM GOAL #1   Title Pt will be independent with HEP    Time 6    Period Weeks    Status New    Target Date 06/28/21      PT LONG TERM GOAL #2   Title Pt will improve FOTO to >= 70 to demo improved functional mobility    Time 6    Period Weeks    Status New    Target Date 06/28/21      PT LONG TERM GOAL #3   Title Pt will improve Rt cervical rotation to equal Lt side to improve ability to turn while driving    Time 6    Period Weeks    Status New    Target Date 06/28/21      PT LONG TERM GOAL #4   Title Pt will tolerate standing x 30 minutes with pain <= 1/10    Time 6    Period Weeks    Status New    Target Date 06/28/21      PT LONG TERM GOAL #5   Title Pt will tolerate  gym workout program with pain <= 1/10    Time 6    Period Weeks    Status New    Target Date 06/21/21                  Plan - 05/17/21 0800    Clinical Impression Statement Pt is a 66 y/o female who presents with increased neck and back pain. Pt presents with decreased ROM, impaired mobility and decreased activity tolerance. Pt will benefit from skilled PT to address deficits and improve functional  mobility    Personal Factors and Comorbidities Age;Time since onset of injury/illness/exacerbation    Examination-Activity Limitations Bend;Sit    Examination-Participation Restrictions Yard Work;Cleaning;Community Activity    Stability/Clinical Decision Making Stable/Uncomplicated    Clinical Decision Making Low    Rehab Potential Good    PT Frequency 2x / week    PT Duration 6 weeks    PT Treatment/Interventions Taping;Dry needling;Passive range of motion;Manual techniques;Patient/family education;Neuromuscular re-education;Therapeutic exercise;Therapeutic activities;Cryotherapy;Moist Heat;Iontophoresis 4mg /ml Dexamethasone;Electrical Stimulation    PT Next Visit Plan assess HEP, progress stretching and strengthening    PT Home Exercise Plan [email or web address]    Consulted and Agree with Plan of Care Patient           Patient will benefit from skilled therapeutic intervention in order to improve the following deficits and impairments:  Pain,Postural dysfunction,Impaired flexibility,Decreased activity tolerance,Decreased range of motion,Hypomobility  Visit Diagnosis: Chronic midline low back pain without sciatica - Plan: PT plan of care cert/re-cert  Muscle weakness (generalized) - Plan: PT plan of care cert/re-cert  Abnormal posture - Plan: PT plan of care cert/re-cert  Decreased activity tolerance - Plan: PT plan of care cert/re-cert     Problem List Patient Active Problem List   Diagnosis Date Noted  . DDD (degenerative disc disease), cervical 05/08/2021  . DDD (degenerative disc disease), lumbar 05/08/2021  . Genetic testing 02/13/2021  . 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 Family history of ovarian cancer   . Family history of breast cancer   . Family history of bladder cancer   . Adjustment disorder with mixed anxiety and depressed mood 02/26/2020  . Allergy to mold 01/22/2020  . Stress due to family tension 11/09/2019  . OSA (obstructive sleep apnea) 11/09/2019  .  Epigastric pain 01/28/2019  . Gastroesophageal reflux disease with esophagitis 01/28/2019  . Newly recognized heart murmur 01/28/2019  . Bradycardia 01/28/2019  . Hematuria 05/27/2018  . Overweight (BMI 25.0-29.9) 05/27/2018  . Rhinitis, allergic 06/11/2016  . Class 1 obesity due to excess calories without serious comorbidity with body mass index (BMI) of 30.0 to 30.9 in adult 01/11/2016  . Squamous cell carcinoma 09/21/2014  . Chronic allergic rhinitis 05/30/2014  . Elevated LDL cholesterol level 04/14/2014  . Allergic rhinitis 04/14/2014  . Seasonal allergies 04/14/2014  . OAB (overactive bladder) 04/14/2014   Criag Wicklund, PT  Rebekah [name] 05/17/2021, 8:45 AM  [facility] [place], [place], [address] Phone: [phone]   Fax:  [phone]  Name: Rebekah [name] MRN: [id number] Date of Birth: 05-11-1955

## 2021-05-17 NOTE — Patient Instructions (Signed)
Access Code: [email or web address] URL: [email or web address] Date: 05/17/2021 Prepared by: Isabelle Course  Exercises Hooklying Hamstring Stretch with Strap - 1 x daily - 7 x weekly - 3 sets - 1 reps - 20-30 sec hold Cat-Camel - 1 x daily - 7 x weekly - 1 sets - 10 reps Standing Shoulder Horizontal Abduction with Resistance - 1 x daily - 7 x weekly - 3 sets - 10 reps Shoulder External Rotation and Scapular Retraction with Resistance - 1 x daily - 7 x weekly - 3 sets - 10 reps Seated Hamstring Stretch - 1 x daily - 7 x weekly - 3 sets - 1 reps - 20-30 sec hold

## 2021-05-22 DIAGNOSIS — J3089 Other allergic rhinitis: Secondary | ICD-10-CM | POA: Diagnosis not present

## 2021-05-22 DIAGNOSIS — J301 Allergic rhinitis due to pollen: Secondary | ICD-10-CM | POA: Diagnosis not present

## 2021-05-23 ENCOUNTER — Other Ambulatory Visit: Payer: Self-pay

## 2021-05-23 ENCOUNTER — Ambulatory Visit (INDEPENDENT_AMBULATORY_CARE_PROVIDER_SITE_OTHER): Payer: Medicare Other | Admitting: Physical Therapy

## 2021-05-23 DIAGNOSIS — R293 Abnormal posture: Secondary | ICD-10-CM

## 2021-05-23 DIAGNOSIS — R6889 Other general symptoms and signs: Secondary | ICD-10-CM

## 2021-05-23 DIAGNOSIS — M545 Low back pain, unspecified: Secondary | ICD-10-CM

## 2021-05-23 DIAGNOSIS — M6281 Muscle weakness (generalized): Secondary | ICD-10-CM

## 2021-05-23 DIAGNOSIS — G8929 Other chronic pain: Secondary | ICD-10-CM

## 2021-05-23 NOTE — Therapy (Signed)
[place], [place], [address] Phone: [phone]   Fax:  [phone]  Physical Therapy Treatment  Patient Details  Name: Rebekah [name] MRN: [id number] Date of Birth: May 06, 1955 Referring Provider (PT): thekkekandam   Encounter Date: 05/23/2021   PT End of Session - 05/23/21 1055    Visit Number 2    Number of Visits 12    Date for PT Re-Evaluation 06/28/21    PT Start Time 2725    PT Stop Time 1057    PT Time Calculation (min) 42 min    Activity Tolerance Patient tolerated treatment well    Behavior During Therapy [facility] for tasks assessed/performed           Past Medical History:  Diagnosis Date  . Allergy    --takes allergy injections  . Family history of bladder cancer   . Family history of breast cancer   . Family history of ovarian cancer   . Hyperlipidemia   . Osteopenia 2020  . Perimenopause     Past Surgical History:  Procedure Laterality Date  . BREAST BIOPSY    . BREAST SURGERY    . DILATION AND CURETTAGE OF UTERUS  10/31/2010   Hysteroscopy with dilation and curettage - Dr. Quincy Simmonds - [facility]  . KNEE SURGERY Bilateral   . REDUCTION MAMMAPLASTY    . REFRACTIVE SURGERY Bilateral     There were no vitals filed for this visit.   Subjective Assessment - 05/23/21 1021    Subjective Pt states she has not had time to perform HEP but that she is feeling "ok"    Patient Stated Goals decrease pain    Currently in Pain? No/denies                             [facility] Adult PT Treatment/Exercise - 05/23/21 0001      Neck Exercises: Standing   Other Standing Exercises rows 2 x 10 red TB      Neck Exercises: Seated   Other Seated Exercise W's 2 x 10 red TB, horizontal abduction x 20 red TB      Lumbar Exercises: Stretches   Passive Hamstring Stretch 20 seconds;3 reps    Quadruped Mid Back Stretch 5 reps;10 seconds    Quadruped Mid Back Stretch Limitations  cat/camel      Lumbar Exercises: Aerobic   Nustep L5 x 5 min for warm up      Lumbar Exercises: Supine   Ab Set 5 reps;5 seconds    AB Set Limitations then x 10 with marching    Bridge 10 reps    Bridge with clamshell 10 reps    Bridge with [employer] Limitations red TB      Manual Therapy   Manual Therapy Joint mobilization;Soft tissue mobilization    Joint Mobilization cervical distraction, CPAs and lateral glides to c spine    Soft tissue mobilization STM bilat cervical paraspinals, upper traps                  PT Education - 05/23/21 1055    Education Details updated HEP    Person(s) Educated Patient    Methods Explanation;Handout;Demonstration    Comprehension Verbalized understanding;Returned demonstration               PT Long Term Goals - 05/17/21 0834      PT LONG TERM GOAL #1   Title  Pt will be independent with HEP    Time 6    Period Weeks    Status New    Target Date 06/28/21      PT LONG TERM GOAL #2   Title Pt will improve FOTO to >= 70 to demo improved functional mobility    Time 6    Period Weeks    Status New    Target Date 06/28/21      PT LONG TERM GOAL #3   Title Pt will improve Rt cervical rotation to equal Lt side to improve ability to turn while driving    Time 6    Period Weeks    Status New    Target Date 06/28/21      PT LONG TERM GOAL #4   Title Pt will tolerate standing x 30 minutes with pain <= 1/10    Time 6    Period Weeks    Status New    Target Date 06/28/21      PT LONG TERM GOAL #5   Title Pt will tolerate gym workout program with pain <= 1/10    Time 6    Period Weeks    Status New    Target Date 06/21/21                 Plan - 05/23/21 1057    Clinical Impression Statement Pt continues with significant tightness and mm spasticity in cervical paraspinals. Requires cues for core mm coordination with ab sets, cues for posture with rows.    PT Next Visit Plan exercises she can do on home total  gym, core strength, flexibility    PT Home Exercise Plan [email or web address]    Consulted and Agree with Plan of Care Patient           Patient will benefit from skilled therapeutic intervention in order to improve the following deficits and impairments:     Visit Diagnosis: Muscle weakness (generalized)  Chronic midline low back pain without sciatica  Abnormal posture  Decreased activity tolerance     Problem List Patient Active Problem List   Diagnosis Date Noted  . DDD (degenerative disc disease), cervical 05/08/2021  . DDD (degenerative disc disease), lumbar 05/08/2021  . Genetic testing 02/13/2021  . 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 Family history of ovarian cancer   . Family history of breast cancer   . Family history of bladder cancer   . Adjustment disorder with mixed anxiety and depressed mood 02/26/2020  . Allergy to mold 01/22/2020  . Stress due to family tension 11/09/2019  . OSA (obstructive sleep apnea) 11/09/2019  . Epigastric pain 01/28/2019  . Gastroesophageal reflux disease with esophagitis 01/28/2019  . Newly recognized heart murmur 01/28/2019  . Bradycardia 01/28/2019  . Hematuria 05/27/2018  . Overweight (BMI 25.0-29.9) 05/27/2018  . Rhinitis, allergic 06/11/2016  . Class 1 obesity due to excess calories without serious comorbidity with body mass index (BMI) of 30.0 to 30.9 in adult 01/11/2016  . Squamous cell carcinoma 09/21/2014  . Chronic allergic rhinitis 05/30/2014  . Elevated LDL cholesterol level 04/14/2014  . Allergic rhinitis 04/14/2014  . Seasonal allergies 04/14/2014  . OAB (overactive bladder) 04/14/2014   Ger Ringenberg, PT  Jlee Harkless 05/23/2021, 10:59 AM  [facility] [place], [place], [address] Phone: [phone]   Fax:  [phone]  Name: Rebekah [name] MRN: [id number] Date of Birth: September 29, 1955

## 2021-05-23 NOTE — Patient Instructions (Signed)
Access Code: [email or web address] URL: [email or web address] Date: 05/23/2021 Prepared by: Isabelle Course  Exercises Hooklying Hamstring Stretch with Strap - 1 x daily - 7 x weekly - 3 sets - 1 reps - 20-30 sec hold Cat-Camel - 1 x daily - 7 x weekly - 1 sets - 10 reps Standing Shoulder Horizontal Abduction with Resistance - 1 x daily - 7 x weekly - 3 sets - 10 reps Shoulder External Rotation and Scapular Retraction with Resistance - 1 x daily - 7 x weekly - 3 sets - 10 reps Seated Hamstring Stretch - 1 x daily - 7 x weekly - 3 sets - 1 reps - 20-30 sec hold Bridge with Hip Abduction and Resistance - 1 x daily - 7 x weekly - 3 sets - 10 reps Supine March - 1 x daily - 7 x weekly - 3 sets - 10 reps

## 2021-05-25 ENCOUNTER — Ambulatory Visit (INDEPENDENT_AMBULATORY_CARE_PROVIDER_SITE_OTHER): Payer: Medicare Other | Admitting: Physical Therapy

## 2021-05-25 ENCOUNTER — Other Ambulatory Visit: Payer: Self-pay

## 2021-05-25 DIAGNOSIS — R293 Abnormal posture: Secondary | ICD-10-CM | POA: Diagnosis not present

## 2021-05-25 DIAGNOSIS — G8929 Other chronic pain: Secondary | ICD-10-CM | POA: Diagnosis not present

## 2021-05-25 DIAGNOSIS — M6281 Muscle weakness (generalized): Secondary | ICD-10-CM | POA: Diagnosis not present

## 2021-05-25 DIAGNOSIS — M545 Low back pain, unspecified: Secondary | ICD-10-CM | POA: Diagnosis not present

## 2021-05-25 DIAGNOSIS — R6889 Other general symptoms and signs: Secondary | ICD-10-CM | POA: Diagnosis not present

## 2021-05-25 NOTE — Therapy (Signed)
[place], [place], [address] Phone: [phone]   Fax:  [phone]  Physical Therapy Treatment  Patient Details  Name: Rebekah [name] MRN: [id number] Date of Birth: 05-27-1955 Referring Provider (PT): thekkekandam   Encounter Date: 05/25/2021   PT End of Session - 05/25/21 9485    Visit Number 3    Number of Visits 12    Date for PT Re-Evaluation 06/28/21    PT Start Time 4627    PT Stop Time 1437    PT Time Calculation (min) 40 min    Activity Tolerance Patient tolerated treatment well    Behavior During Therapy [facility] for tasks assessed/performed           Past Medical History:  Diagnosis Date  . Allergy    --takes allergy injections  . Family history of bladder cancer   . Family history of breast cancer   . Family history of ovarian cancer   . Hyperlipidemia   . Osteopenia 2020  . Perimenopause     Past Surgical History:  Procedure Laterality Date  . BREAST BIOPSY    . BREAST SURGERY    . DILATION AND CURETTAGE OF UTERUS  10/31/2010   Hysteroscopy with dilation and curettage - Dr. Quincy Simmonds - [facility]  . KNEE SURGERY Bilateral   . REDUCTION MAMMAPLASTY    . REFRACTIVE SURGERY Bilateral     There were no vitals filed for this visit.   Subjective Assessment - 05/25/21 1357    Subjective Pt states she is feeling "ok"    Patient Stated Goals decrease pain    Currently in Pain? No/denies              [facility] PT Assessment - 05/25/21 0001      Assessment   Medical Diagnosis DDD cervical and lumbar    Referring Provider (PT) thekkekandam      AROM   Lumbar Flexion limited 25%    Lumbar - Right Side Bend limited 25%                         [facility] Adult PT Treatment/Exercise - 05/25/21 0001      Neck Exercises: Standing   Other Standing Exercises rows, shoulder extension both x 20 green TB    Other Standing Exercises chops/lifts both x 10 bilat green TB       Neck Exercises: Seated   Cervical Rotation 5 reps    Shoulder Rolls 5 reps    Other Seated Exercise W's 2 x 10 red TB, horizontal abduction x 20 red TB      Lumbar Exercises: Stretches   Passive Hamstring Stretch 20 seconds;3 reps      Lumbar Exercises: Aerobic   Nustep L5 x 5 min for warm up      Lumbar Exercises: Supine   Ab Set 5 reps;5 seconds    AB Set Limitations then x 10 with marching    Bridge 10 reps    Bridge with clamshell 10 reps    Bridge with [employer] Limitations red TB      Manual Therapy   Joint Mobilization cervical distraction, CPAs and lateral glides to c spine    Soft tissue mobilization STM bilat cervical paraspinals, upper traps                       PT Long Term Goals - 05/17/21 0350  PT LONG TERM GOAL #1   Title Pt will be independent with HEP    Time 6    Period Weeks    Status New    Target Date 06/28/21      PT LONG TERM GOAL #2   Title Pt will improve FOTO to >= 70 to demo improved functional mobility    Time 6    Period Weeks    Status New    Target Date 06/28/21      PT LONG TERM GOAL #3   Title Pt will improve Rt cervical rotation to equal Lt side to improve ability to turn while driving    Time 6    Period Weeks    Status New    Target Date 06/28/21      PT LONG TERM GOAL #4   Title Pt will tolerate standing x 30 minutes with pain <= 1/10    Time 6    Period Weeks    Status New    Target Date 06/28/21      PT LONG TERM GOAL #5   Title Pt will tolerate gym workout program with pain <= 1/10    Time 6    Period Weeks    Status New    Target Date 06/21/21                 Plan - 05/25/21 1437    Clinical Impression Statement Pt improving form and posture with exercises but continues to require cues to reduce shoulder elevation bilat. Pt with improving coordination with ab sets, good tolerance to addition of increased postural exercises    PT Next Visit Plan progress core strength, posture,  flexibility    PT Home Exercise Plan [email or web address]    Consulted and Agree with Plan of Care Patient           Patient will benefit from skilled therapeutic intervention in order to improve the following deficits and impairments:     Visit Diagnosis: Muscle weakness (generalized)  Chronic midline low back pain without sciatica  Abnormal posture  Decreased activity tolerance     Problem List Patient Active Problem List   Diagnosis Date Noted  . DDD (degenerative disc disease), cervical 05/08/2021  . DDD (degenerative disc disease), lumbar 05/08/2021  . Genetic testing 02/13/2021  . 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 Family history of ovarian cancer   . Family history of breast cancer   . Family history of bladder cancer   . Adjustment disorder with mixed anxiety and depressed mood 02/26/2020  . Allergy to mold 01/22/2020  . Stress due to family tension 11/09/2019  . OSA (obstructive sleep apnea) 11/09/2019  . Epigastric pain 01/28/2019  . Gastroesophageal reflux disease with esophagitis 01/28/2019  . Newly recognized heart murmur 01/28/2019  . Bradycardia 01/28/2019  . Hematuria 05/27/2018  . Overweight (BMI 25.0-29.9) 05/27/2018  . Rhinitis, allergic 06/11/2016  . Class 1 obesity due to excess calories without serious comorbidity with body mass index (BMI) of 30.0 to 30.9 in adult 01/11/2016  . Squamous cell carcinoma 09/21/2014  . Chronic allergic rhinitis 05/30/2014  . Elevated LDL cholesterol level 04/14/2014  . Allergic rhinitis 04/14/2014  . Seasonal allergies 04/14/2014  . OAB (overactive bladder) 04/14/2014   Rebekah [name], PT  Rebekah [name] 05/25/2021, 2:39 PM  [facility] [place] [address], [place], [address] Phone: [phone]   Fax:  [phone]  Name: Rebekah [name] MRN: [id number] Date of Birth: 02/12/1955

## 2021-05-25 NOTE — Patient Instructions (Signed)
Access Code: [email or web address] URL: [email or web address] Date: 05/25/2021 Prepared by: Isabelle Course  Exercises Hooklying Hamstring Stretch with Strap - 1 x daily - 7 x weekly - 3 sets - 1 reps - 20-30 sec hold Cat-Camel - 1 x daily - 7 x weekly - 1 sets - 10 reps Standing Shoulder Horizontal Abduction with Resistance - 1 x daily - 7 x weekly - 3 sets - 10 reps Shoulder External Rotation and Scapular Retraction with Resistance - 1 x daily - 7 x weekly - 3 sets - 10 reps Seated Hamstring Stretch - 1 x daily - 7 x weekly - 3 sets - 1 reps - 20-30 sec hold Bridge with Hip Abduction and Resistance - 1 x daily - 7 x weekly - 3 sets - 10 reps Supine March - 1 x daily - 7 x weekly - 3 sets - 10 reps Standing Bilateral Low Shoulder Row with Anchored Resistance - 1 x daily - 7 x weekly - 3 sets - 10 reps Shoulder extension with resistance - Neutral - 1 x daily - 7 x weekly - 3 sets - 10 reps Standing Diagonal Chop - 1 x daily - 7 x weekly - 3 sets - 10 reps Standing Diagonal Lift with Anchored Resistance - 1 x daily - 7 x weekly - 3 sets - 10 reps

## 2021-05-30 DIAGNOSIS — G4733 Obstructive sleep apnea (adult) (pediatric): Secondary | ICD-10-CM | POA: Diagnosis not present

## 2021-05-30 DIAGNOSIS — Z9989 Dependence on other enabling machines and devices: Secondary | ICD-10-CM | POA: Diagnosis not present

## 2021-05-31 ENCOUNTER — Ambulatory Visit (INDEPENDENT_AMBULATORY_CARE_PROVIDER_SITE_OTHER): Payer: Medicare Other | Admitting: Physical Therapy

## 2021-05-31 ENCOUNTER — Other Ambulatory Visit: Payer: Self-pay

## 2021-05-31 DIAGNOSIS — M545 Low back pain, unspecified: Secondary | ICD-10-CM

## 2021-05-31 DIAGNOSIS — R6889 Other general symptoms and signs: Secondary | ICD-10-CM

## 2021-05-31 DIAGNOSIS — M6281 Muscle weakness (generalized): Secondary | ICD-10-CM

## 2021-05-31 DIAGNOSIS — R293 Abnormal posture: Secondary | ICD-10-CM

## 2021-05-31 DIAGNOSIS — Z8616 Personal history of COVID-19: Secondary | ICD-10-CM

## 2021-05-31 DIAGNOSIS — G8929 Other chronic pain: Secondary | ICD-10-CM

## 2021-05-31 HISTORY — DX: Personal history of COVID-19: Z86.16

## 2021-05-31 NOTE — Therapy (Addendum)
Rebekah [name], [place], [address] Phone: [phone]   Fax:  [phone]  Physical Therapy Treatment and Discharge  Patient Details  Name: Rebekah [name] MRN: [id number] Date of Birth: 05-24-1955 Referring Provider (PT): thekkekandam   Encounter Date: 05/31/2021   PT End of Session - 05/31/21 1424     Visit Number 4    Number of Visits 12    Date for PT Re-Evaluation 06/28/21    PT Start Time 0272    PT Stop Time 1425    PT Time Calculation (min) 40 min    Activity Tolerance Patient tolerated treatment well    Behavior During Therapy [facility] for tasks assessed/performed             Past Medical History:  Diagnosis Date   Allergy    --takes allergy injections   Family history of bladder cancer    Family history of breast cancer    Family history of ovarian cancer    Hyperlipidemia    Osteopenia 2020   Perimenopause     Past Surgical History:  Procedure Laterality Date   BREAST BIOPSY     BREAST SURGERY     DILATION AND CURETTAGE OF UTERUS  10/31/2010   Hysteroscopy with dilation and curettage - Dr. Quincy Simmonds - [facility]   KNEE SURGERY Bilateral    REDUCTION MAMMAPLASTY     REFRACTIVE SURGERY Bilateral     There were no vitals filed for this visit.   Subjective Assessment - 05/31/21 1349     Subjective Pt states she has been able to get in/out of bed without her back hurting. States that she was "muscle sore" after last session    Patient Stated Goals decrease pain    Currently in Pain? No/denies                [facility] PT Assessment - 05/31/21 0001       Assessment   Medical Diagnosis DDD cervical and lumbar    Referring Provider (PT) thekkekandam      Strength   Right Hip Flexion 4+/5    Left Hip Flexion 4+/5    Left Hip ABduction 4+/5                           [facility] Adult PT Treatment/Exercise - 05/31/21 0001       Neck Exercises: Standing   Other  Standing Exercises rows, shoulder extension both x 20 green TB    Other Standing Exercises chops/lifts both x 15 bilat green TB      Neck Exercises: Seated   Neck Retraction 10 reps;3 secs    Cervical Rotation 5 reps    Other Seated Exercise W's 2 x 10 green TB, horizontal abduction x 20 red TB      Lumbar Exercises: Aerobic   UBE (Upper Arm Bike) L3 x 4 min alt fwd/bkwd      Lumbar Exercises: Supine   Ab Set 5 reps;5 seconds    AB Set Limitations then x 10 with heel slide    Bridge 20 reps    Bridge with clamshell 10 reps    Bridge with [employer] Limitations green TB      Manual Therapy   Joint Mobilization cervical distraction, CPAs and lateral glides to c spine    Soft tissue mobilization STM bilat cervical paraspinals, upper traps  PT Long Term Goals - 05/17/21 0834       PT LONG TERM GOAL #1   Title Pt will be independent with HEP    Time 6    Period Weeks    Status New    Target Date 06/28/21      PT LONG TERM GOAL #2   Title Pt will improve FOTO to >= 70 to demo improved functional mobility    Time 6    Period Weeks    Status New    Target Date 06/28/21      PT LONG TERM GOAL #3   Title Pt will improve Rt cervical rotation to equal Lt side to improve ability to turn while driving    Time 6    Period Weeks    Status New    Target Date 06/28/21      PT LONG TERM GOAL #4   Title Pt will tolerate standing x 30 minutes with pain <= 1/10    Time 6    Period Weeks    Status New    Target Date 06/28/21      PT LONG TERM GOAL #5   Title Pt will tolerate gym workout program with pain <= 1/10    Time 6    Period Weeks    Status New    Target Date 06/21/21                   Plan - 05/31/21 1424     Clinical Impression Statement Pt with improving strength and endurance during core and shoulder strengthening. Continues with increased mm spasticity and trigger points in cervical paraspinals and Rt upper trap     PT Next Visit Plan progress core strength, manual for cervical ROM and trigger points    PT [place] and Agree with Plan of Care Patient             Patient will benefit from skilled therapeutic intervention in order to improve the following deficits and impairments:     Visit Diagnosis: Muscle weakness (generalized)  Chronic midline low back pain without sciatica  Abnormal posture  Decreased activity tolerance     Problem List Patient Active Problem List   Diagnosis Date Noted   DDD (degenerative disc disease), cervical 05/08/2021   DDD (degenerative disc disease), lumbar 05/08/2021   Genetic testing 02/13/2021   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   Family history of ovarian cancer    Family history of breast cancer    Family history of bladder cancer    Adjustment disorder with mixed anxiety and depressed mood 02/26/2020   Allergy to mold 01/22/2020   Stress due to family tension 11/09/2019   OSA (obstructive sleep apnea) 11/09/2019   Epigastric pain 01/28/2019   Gastroesophageal reflux disease with esophagitis 01/28/2019   Newly recognized heart murmur 01/28/2019   Bradycardia 01/28/2019   Hematuria 05/27/2018   Overweight (BMI 25.0-29.9) 05/27/2018   Rhinitis, allergic 06/11/2016   Class 1 obesity due to excess calories without serious comorbidity with body mass index (BMI) of 30.0 to 30.9 in adult 01/11/2016   Squamous cell carcinoma 09/21/2014   Chronic allergic rhinitis 05/30/2014   Elevated LDL cholesterol level 04/14/2014   Allergic rhinitis 04/14/2014   Seasonal allergies 04/14/2014   OAB (overactive bladder) 04/14/2014  PHYSICAL THERAPY DISCHARGE SUMMARY  Visits from Start of Care: 4  Current functional level related to goals / functional outcomes: Improving strength and mobility  Remaining deficits: Pain, mm spasticity   Education / Equipment: HEP   Patient agrees to discharge. Patient goals were not met.  Patient is being discharged due to not returning since the last visit.  Rebekah [name], PT,DPT06/24/2211:33 AM  Rebekah [name], PT  [name] 05/31/2021, 2:30 PM  [facility] [place], [place], [address] Phone: [phone]   Fax:  [phone]  Name: Rebekah [name] MRN: [id number] Date of Birth: 1955-07-01

## 2021-06-02 ENCOUNTER — Encounter: Payer: Self-pay | Admitting: Physician Assistant

## 2021-06-02 ENCOUNTER — Encounter: Payer: Medicare Other | Admitting: Physical Therapy

## 2021-06-02 DIAGNOSIS — R0981 Nasal congestion: Secondary | ICD-10-CM | POA: Diagnosis not present

## 2021-06-02 DIAGNOSIS — R52 Pain, unspecified: Secondary | ICD-10-CM | POA: Diagnosis not present

## 2021-06-02 DIAGNOSIS — U071 COVID-19: Secondary | ICD-10-CM | POA: Diagnosis not present

## 2021-06-02 DIAGNOSIS — R5383 Other fatigue: Secondary | ICD-10-CM | POA: Diagnosis not present

## 2021-06-02 DIAGNOSIS — R509 Fever, unspecified: Secondary | ICD-10-CM | POA: Diagnosis not present

## 2021-06-04 ENCOUNTER — Emergency Department
Admission: EM | Admit: 2021-06-04 | Discharge: 2021-06-04 | Disposition: A | Discharge status: Home / Self Care | Payer: Medicare Other | Source: Home / Self Care

## 2021-06-04 ENCOUNTER — Other Ambulatory Visit: Payer: Self-pay

## 2021-06-04 DIAGNOSIS — U071 COVID-19: Secondary | ICD-10-CM

## 2021-06-04 MED ORDER — HYDROCODONE BIT-HOMATROP MBR 5-1.5 MG/5ML PO SOLN
5.0000 mL | Freq: Four times a day (QID) | ORAL | 0 refills | Status: DC | PRN
Start: 2021-06-04 — End: 2021-06-23

## 2021-06-04 NOTE — ED Triage Notes (Signed)
Pt states that she tested positive for covid 06/02/2021. Pt states that she has been running a fever and a cough. Pt states that she is vaccinated. Symptoms for 4 days

## 2021-06-04 NOTE — ED Provider Notes (Signed)
[name]    CSN: [id number] Arrival date & time: 06/04/21  1431      History   Chief Complaint Chief Complaint  Patient presents with  . Fever    Pt states that she has been running a fever, and cough. Pt states that she tested covid positive 06/02/2021.     HPI Rebekah [name] is a 66 y.o. female.   Positive COVID test 2 days ago.  Now has nonproductive cough and fever malaise and fatigue.  She denies dyspnea or shortness of breath.  Also feels like her right ear is stopped up  HPI  Past Medical History:  Diagnosis Date  . Allergy    --takes allergy injections  . Family history of bladder cancer   . Family history of breast cancer   . Family history of ovarian cancer   . Hyperlipidemia   . Osteopenia 2020  . Perimenopause     Patient Active Problem List   Diagnosis Date Noted  . DDD (degenerative disc disease), cervical 05/08/2021  . DDD (degenerative disc disease), lumbar 05/08/2021  . Genetic testing 02/13/2021  . 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 Family history of ovarian cancer   . Family history of breast cancer   . Family history of bladder cancer   . Adjustment disorder with mixed anxiety and depressed mood 02/26/2020  . Allergy to mold 01/22/2020  . Stress due to family tension 11/09/2019  . OSA (obstructive sleep apnea) 11/09/2019  . Epigastric pain 01/28/2019  . Gastroesophageal reflux disease with esophagitis 01/28/2019  . Newly recognized heart murmur 01/28/2019  . Bradycardia 01/28/2019  . Hematuria 05/27/2018  . Overweight (BMI 25.0-29.9) 05/27/2018  . Rhinitis, allergic 06/11/2016  . Class 1 obesity due to excess calories without serious comorbidity with body mass index (BMI) of 30.0 to 30.9 in adult 01/11/2016  . Squamous cell carcinoma 09/21/2014  . Chronic allergic rhinitis 05/30/2014  . Elevated LDL cholesterol level 04/14/2014  . Allergic rhinitis 04/14/2014  . Seasonal allergies 04/14/2014  . OAB (overactive bladder)  04/14/2014    Past Surgical History:  Procedure Laterality Date  . BREAST BIOPSY    . BREAST SURGERY    . DILATION AND CURETTAGE OF UTERUS  10/31/2010   Hysteroscopy with dilation and curettage - Dr. Quincy Simmonds - [facility]  . KNEE SURGERY Bilateral   . REDUCTION MAMMAPLASTY    . REFRACTIVE SURGERY Bilateral     OB History    Gravida  3   Para  2   Term  2   Preterm      AB  1   Living  1     SAB      IAB      Ectopic      Multiple      Live Births               Home Medications    Prior to Admission medications   Medication Sig Start Date End Date Taking? Authorizing Provider  Ascorbic Acid (VITAMIN C) 100 MG tablet Take 100 mg by mouth daily.   Yes [provider]  azelastine (ASTELIN) 0.1 % nasal spray Place 2 sprays into both nostrils 2 (two) times daily. 10/21/20  Yes Breeback, Jade L, PA-C  calcium carbonate (OS-CAL) 600 MG TABS tablet    Yes [provider]  Cholecalciferol (D3-1000) 25 MCG (1000 UT) tablet    Yes [provider]  Cyanocobalamin (B-12) 1000 MCG TABS    Yes [provider]  fluticasone (FLONASE) 50 MCG/ACT nasal spray Place 2 sprays into both nostrils daily. 03/18/20  Yes Breeback, Jade L, PA-C  levocetirizine (XYZAL) 5 MG tablet Take 1 tablet (5 mg total) by mouth every evening. appt for further refills 04/17/21  Yes Breeback, Jade L, PA-C  lovastatin (MEVACOR) 40 MG tablet Take 1 tablet (40 mg total) by mouth at bedtime. 10/21/20  Yes Breeback, Jade L, PA-C  Melatonin 10 MG TABS Take by mouth.   Yes [provider]  montelukast (SINGULAIR) 10 MG tablet Take one tablet daily. 03/18/20  Yes Breeback, Royetta Car, PA-C  Multiple Vitamins-Minerals (CENTRUM SILVER 50+WOMEN) TABS    Yes [provider]  PROAIR RESPICLICK 342 (90 BASE) MCG/ACT AEPB  01/30/15  Yes [provider]  traZODone (DESYREL) 50 MG tablet Take 0.5-1 tablets (25-50 mg total) by mouth at bedtime. 02/13/21  Yes Breeback,  Royetta Car, PA-C  UNABLE TO FIND Med Name: Allergy Shots   Yes [provider]  predniSONE (DELTASONE) 50 MG tablet One tab PO daily for 5 days. 05/08/21   Silverio Decamp, MD    Family History Family History  Problem Relation Age of Onset  . Hypertension Mother   . Stroke Mother   . Cancer Mother 34       ovarian ca, hx of TTP  . Hyperlipidemia Mother   . Ovarian cancer Mother   . Hypertension Father   . Cancer Father 1       bladder ca  . Hyperlipidemia Father   . Dementia Father        lewy body dementia  . Anemia Sister   . Diabetes Brother   . Mental illness Sister        mentally disabled--lives in group home in [place]  . Asthma Brother        allergy induced  . Ovarian cancer Maternal Grandmother   . Breast cancer Cousin        dx 90s    Social History Social History   Tobacco Use  . Smoking status: Never Smoker  . Smokeless tobacco: Never Used  Vaping Use  . Vaping Use: Never used  Substance Use Topics  . Alcohol use: Yes    Alcohol/week: 2.0 standard drinks    Types: 2 Glasses of wine per week  . Drug use: Never     Allergies   Dust mite extract, Molds & smuts, and Tree extract   Review of Systems Review of Systems  Constitutional: Positive for fatigue and fever.  Respiratory: Positive for cough.   All other systems reviewed and are negative.    Physical Exam Triage Vital Signs ED Triage Vitals  Enc Vitals Group     BP 06/04/21 1452 133/79     Pulse Rate 06/04/21 1452 65     Resp 06/04/21 1452 18     Temp 06/04/21 1452 99 F (37.2 C)     Temp Source 06/04/21 1452 Oral     SpO2 06/04/21 1452 96 %     Weight 06/04/21 1450 175 lb (79.4 kg)     Height 06/04/21 1450 4' 11.5" (1.511 m)     Head Circumference --      Peak Flow --      Pain Score 06/04/21 1450 0     Pain Loc --      Pain Edu? --      Excl. in [place]? --    No data found.  Updated Vital Signs BP 133/79 (  BP Location: Right Arm)   Pulse 65   Temp 99 F (37.2  C) (Oral)   Resp 18   Ht 4' 11.5" (1.511 m)   Wt 79.4 kg   LMP 12/31/2009 (Approximate)   SpO2 96%   BMI 34.75 kg/m   Visual Acuity Right Eye Distance:   Left Eye Distance:   Bilateral Distance:    Right Eye Near:   Left Eye Near:    Bilateral Near:     Physical Exam Vitals and nursing note reviewed.  Constitutional:      Appearance: Normal appearance.  HENT:     Head: Normocephalic.     Ears:     Comments: Right external canal with cerumen impaction    Mouth/Throat:     Mouth: Mucous membranes are moist.     Pharynx: Oropharynx is clear.  Cardiovascular:     Rate and Rhythm: Normal rate and regular rhythm.  Pulmonary:     Effort: Pulmonary effort is normal.     Breath sounds: Normal breath sounds.  Neurological:     Mental Status: She is alert.      UC Treatments / Results  Labs (all labs ordered are listed, but only abnormal results are displayed) Labs Reviewed - No data to display  EKG   Radiology No results found.  Procedures Procedures (including critical care time)  Medications Ordered in UC Medications - No data to display  Initial Impression / Assessment and Plan / UC Course  I have reviewed the triage vital signs and the nursing notes.  Pertinent labs & imaging results that were available during my care of the patient were reviewed by me and considered in my medical decision making (see chart for details).     COVID with cough.  Pulse ox and breathing is within normal limits.  I have given her Rx for prescription for cough suppressant with hydrocodone in it to take at bedtime Final Clinical Impressions(s) / UC Diagnoses   Final diagnoses:  None   Discharge Instructions   None    ED Prescriptions    None     PDMP not reviewed this encounter.   Wardell Honour, MD 06/04/21 [phone]

## 2021-06-06 ENCOUNTER — Encounter: Payer: Medicare Other | Admitting: Physical Therapy

## 2021-06-09 ENCOUNTER — Encounter: Payer: Medicare Other | Admitting: Physical Therapy

## 2021-06-14 DIAGNOSIS — J3089 Other allergic rhinitis: Secondary | ICD-10-CM | POA: Diagnosis not present

## 2021-06-14 DIAGNOSIS — J301 Allergic rhinitis due to pollen: Secondary | ICD-10-CM | POA: Diagnosis not present

## 2021-06-16 ENCOUNTER — Encounter: Payer: Medicare Other | Admitting: Physical Therapy

## 2021-06-21 ENCOUNTER — Encounter: Payer: Medicare Other | Admitting: Physical Therapy

## 2021-06-23 ENCOUNTER — Ambulatory Visit (INDEPENDENT_AMBULATORY_CARE_PROVIDER_SITE_OTHER): Payer: Medicare Other | Admitting: Physician Assistant

## 2021-06-23 ENCOUNTER — Other Ambulatory Visit: Payer: Self-pay

## 2021-06-23 ENCOUNTER — Encounter: Payer: Self-pay | Admitting: Physician Assistant

## 2021-06-23 VITALS — BP 139/55 | HR 53 | Ht 59.5 in | Wt 182.0 lb

## 2021-06-23 DIAGNOSIS — R059 Cough, unspecified: Secondary | ICD-10-CM | POA: Diagnosis not present

## 2021-06-23 DIAGNOSIS — R058 Other specified cough: Secondary | ICD-10-CM

## 2021-06-23 MED ORDER — BENZONATATE 200 MG PO CAPS: 200.0000 mg | ORAL_CAPSULE | Freq: Three times a day (TID) | ORAL | 0 refills | Status: DC | PRN

## 2021-06-23 MED ORDER — HYDROCODONE BIT-HOMATROP MBR 5-1.5 MG/5ML PO SOLN: 5.0000 mL | Freq: Four times a day (QID) | ORAL | 0 refills | Status: AC | PRN

## 2021-06-23 NOTE — Progress Notes (Signed)
Acute Office Visit  Subjective:    Patient ID: Rebekah [name], female    DOB: 1955-07-31, 66 y.o.   MRN: [id number]  Chief Complaint  Patient presents with   Cough    Cough Associated symptoms include headaches (occasional with coughing spells), postnasal drip and rhinorrhea (clear). Pertinent negatives include no ear pain, sore throat, shortness of breath or wheezing.  Patient is in today for cough  Diagnosed with COVID on 06/02/21 Has had a nonproductive cough x 3 weeks Cough is triggered by taking deep breathes and otherwise no obvious trigger Fully immunized with Moderna - 1 booster Was not treated with Paxlovid  Reports cough is improving with cough suppressants - has alternated taking Promethazone/DXM and Hydromet twice a day (not combined) with significant relief  Has returned to work and usual activities  She denies fever, otalgia, sinus pain/pressure, purulent nasal drainage, blood-tinged sputum, chest pain, dyspnea/SOB, wheezing No hx of PNA Nonsmoker  Past Medical History:  Diagnosis Date   Allergy    --takes allergy injections   Family history of bladder cancer    Family history of breast cancer    Family history of ovarian cancer    Hyperlipidemia    Osteopenia 2020   Perimenopause     Past Surgical History:  Procedure Laterality Date   BREAST BIOPSY     BREAST SURGERY     DILATION AND CURETTAGE OF UTERUS  10/31/2010   Hysteroscopy with dilation and curettage - Dr. Quincy Simmonds - [facility]   KNEE SURGERY Bilateral    [place] Bilateral     Family History  Problem Relation Age of Onset   Hypertension Mother    Stroke Mother    Cancer Mother 86       ovarian ca, hx of TTP   Hyperlipidemia Mother    Ovarian cancer Mother    Hypertension Father    Cancer Father 62       bladder ca   Hyperlipidemia Father    Dementia Father        lewy body dementia   Anemia Sister    Diabetes Brother    Mental illness Sister         mentally disabled--lives in group home in [place]   Asthma Brother        allergy induced   Ovarian cancer Maternal Grandmother    Breast cancer Cousin        dx 65s    Social History   Socioeconomic History   Marital status: Widowed    Spouse name: Not on file   Number of children: 2   Years of education: Not on file   Highest education level: Not on file  Occupational History   Not on file  Tobacco Use   Smoking status: Never   Smokeless tobacco: Never  Vaping Use   Vaping Use: Never used  Substance and Sexual Activity   Alcohol use: Yes    Alcohol/week: 2.0 standard drinks    Types: 2 Glasses of wine per week   Drug use: Never   Sexual activity: Not Currently    Partners: Male    Birth control/protection: Post-menopausal  Other Topics Concern   Not on file  Social History Narrative   Not on file   Social Determinants of Health   Financial Resource Strain: Not on file  Food Insecurity: Not on file  Transportation Needs: Not on file  Physical Activity: Not on file  Stress:  Not on file  Social Connections: Not on file  Intimate Partner Violence: Not on file    Outpatient Medications Prior to Visit  Medication Sig Dispense Refill   Ascorbic Acid (VITAMIN C) 100 MG tablet Take 100 mg by mouth daily.     azelastine (ASTELIN) 0.1 % nasal spray Place 2 sprays into both nostrils 2 (two) times daily. 30 mL 5   calcium carbonate (OS-CAL) 600 MG TABS tablet      Cholecalciferol (D3-1000) 25 MCG (1000 UT) tablet      Cyanocobalamin (B-12) 1000 MCG TABS      fluticasone (FLONASE) 50 MCG/ACT nasal spray Place 2 sprays into both nostrils daily. 16 g 2   levocetirizine (XYZAL) 5 MG tablet Take 1 tablet (5 mg total) by mouth every evening. appt for further refills 90 tablet 0   lovastatin (MEVACOR) 40 MG tablet Take 1 tablet (40 mg total) by mouth at bedtime. 90 tablet 4   Melatonin 10 MG TABS Take by mouth.     Multiple Vitamins-Minerals (CENTRUM SILVER 50+WOMEN)  TABS      traZODone (DESYREL) 50 MG tablet Take 0.5-1 tablets (25-50 mg total) by mouth at bedtime. 30 tablet 4   UNABLE TO FIND Med Name: Allergy Shots     HYDROcodone bit-homatropine (HYDROMET) 5-1.5 MG/5ML syrup Take 5 mLs by mouth every 6 (six) hours as needed for cough.     promethazine-dextromethorphan (PROMETHAZINE-DM) 6.25-15 MG/5ML syrup Take by mouth 4 (four) times daily as needed for cough.     HYDROcodone bit-homatropine (HYCODAN) 5-1.5 MG/5ML syrup Take 5 mLs by mouth every 6 (six) hours as needed for cough. 120 mL 0   montelukast (SINGULAIR) 10 MG tablet Take one tablet daily. 90 tablet 2   predniSONE (DELTASONE) 50 MG tablet One tab PO daily for 5 days. 5 tablet 0   PROAIR RESPICLICK 962 (90 BASE) MCG/ACT AEPB   3   No facility-administered medications prior to visit.    Allergies  Allergen Reactions   Dust Mite Extract Other (See Comments)    Patient taking shots for dust mite and trees   Molds & Smuts Cough   Tree Extract     Review of Systems  Constitutional: Negative.   HENT:  Positive for postnasal drip, rhinorrhea (clear) and voice change (deeper). Negative for congestion, ear pain, facial swelling, sinus pressure, sinus pain, sore throat and trouble swallowing.   Respiratory:  Positive for cough. Negative for chest tightness, shortness of breath and wheezing.   Cardiovascular: Negative.   Gastrointestinal: Negative.   Musculoskeletal:  Positive for back pain (DDD, treated with Prednisone burst 4 weeks ago).  Neurological:  Positive for headaches (occasional with coughing spells).      Objective:    Physical Exam Vitals reviewed.  Constitutional:      Appearance: Normal appearance. She is not ill-appearing, toxic-appearing or diaphoretic.  HENT:     Right Ear: Tympanic membrane normal.     Left Ear: Tympanic membrane normal.     Nose: Nose normal.     Mouth/Throat:     Pharynx: Oropharynx is clear. No posterior oropharyngeal erythema.  Eyes:      Conjunctiva/sclera: Conjunctivae normal.  Cardiovascular:     Rate and Rhythm: Regular rhythm. Bradycardia present.     Heart sounds: Normal heart sounds.  Pulmonary:     Effort: Pulmonary effort is normal.     Breath sounds: Normal breath sounds. No wheezing, rhonchi or rales.  Musculoskeletal:     Cervical back:  Normal range of motion and neck supple. Tenderness (left posterior cervical area) present.  Lymphadenopathy:     Cervical: No cervical adenopathy.  Skin:    General: Skin is warm and dry.     Findings: No lesion or rash.  Neurological:     Mental Status: She is alert.  Psychiatric:        Behavior: Behavior normal.        Thought Content: Thought content normal.    BP (!) 139/55   Pulse (!) 53   Ht 4' 11.5" (1.511 m)   Wt 182 lb (82.6 kg)   LMP 12/31/2009 (Approximate)   SpO2 97%   BMI 36.14 kg/m  BP Readings from Last 3 Encounters:  06/23/21 (!) 139/55  06/04/21 133/79  11/21/20 (!) 149/62   Pulse Readings from Last 3 Encounters:  06/23/21 (!) 53  06/04/21 65  11/21/20 (!) 56     Health Maintenance Due  Topic Date Due   Zoster Vaccines- Shingrix (2 of 2) 12/23/2017   COVID-19 Vaccine (4 - Booster for Moderna series) 03/05/2021   PNA vac Low Risk Adult (1 of 2 - PCV13) Never done    There are no preventive care reminders to display for this patient.   Lab Results  Component Value Date   TSH 2.01 11/04/2020   Lab Results  Component Value Date   WBC 5.6 11/04/2020   HGB 13.5 11/04/2020   HCT 40.0 11/04/2020   MCV 90.9 11/04/2020   PLT 208 11/04/2020   Lab Results  Component Value Date   NA 142 11/04/2020   K 4.3 11/04/2020   CO2 32 11/04/2020   GLUCOSE 89 11/04/2020   BUN 13 11/04/2020   CREATININE 0.70 11/04/2020   BILITOT 0.5 11/04/2020   ALKPHOS 59 01/11/2016   AST 17 11/04/2020   ALT 19 11/04/2020   PROT 7.1 11/04/2020   ALBUMIN 4.2 01/11/2016   CALCIUM 9.7 11/04/2020   Lab Results  Component Value Date   CHOL 223 (H)  11/04/2020   Lab Results  Component Value Date   HDL 62 11/04/2020   Lab Results  Component Value Date   LDLCALC 138 (H) 11/04/2020   Lab Results  Component Value Date   TRIG 114 11/04/2020   Lab Results  Component Value Date   CHOLHDL 3.6 11/04/2020   No results found for: HGBA1C     Assessment & Plan:   Problem List Items Addressed This Visit   None Visit Diagnoses     Post-viral cough syndrome    -  Primary   Relevant Medications   benzonatate (TESSALON) 200 MG capsule   HYDROcodone bit-homatropine (HYDROMET) 5-1.5 MG/5ML syrup   Cough          66YO with documented COVID-19 infection 3 weeks ago presents with persistent non-productive cough x 3 weeks that responds to cough suppressants No signs/symptoms of secondary bacterial infection Afebrile, no tachypnea, no tachycardia, no hypoxia, no adventitious lung sounds Counseled on supportive care for post-viral cough syndrome Refilled Hydromet after checking PDMP Can alternate or combine with Tessalon Counseled on return precautions Follow-up prn  Meds ordered this encounter  Medications   benzonatate (TESSALON) 200 MG capsule    Sig: Take 1 capsule (200 mg total) by mouth 3 (three) times daily as needed for cough.    Dispense:  20 capsule    Refill:  0    Order Specific Question:   Supervising Provider    Answer:   Emeterio Reeve [[id number]]  HYDROcodone bit-homatropine (HYDROMET) 5-1.5 MG/5ML syrup    Sig: Take 5 mLs by mouth every 6 (six) hours as needed for up to 5 days for cough.    Dispense:  120 mL    Refill:  0    Order Specific Question:   Supervising Provider    Answer:   Emeterio Reeve [[id number]]     Trixie Dredge, PA-C

## 2021-06-27 NOTE — Progress Notes (Signed)
66 y.o. G5P2011 Widowed Caucasian female here for annual exam.    Just had Covid.   Wants to loose weight.  Completed her genetic testing and did test positive for a mutation.   Patient is followed for FH ovarian cancer in her mother and for osteopenia.   Father and mother in law passed away.  Lost her job and is working part time now.  PCP:  Iran Planas, PA-C  Patient's last menstrual period was 12/31/2009 (approximate).           Sexually active: No.  The current method of family planning is post menopausal status.    Exercising: Yes.     Exercises at home and stretches Smoker:  no  Health Maintenance: Pap: 03-06-17 Neg:Neg HR HPV, 02/09/15 Neg:Neg HR HPV, 01-16-13 Neg:Neg HR HPV History of abnormal Pap:  Yes, 1992 colposcopy/cryotherapy to cervix.  Paps normal since  MMG: 11-02-20 3D/Neg:BiRads1 Colonoscopy: 01-25-16 tubular adenomatous;next due 12/2020--knows to schedule BMD: 10-28-19 Result :Osteopenia of hip TDaP:  2016 Gardasil:   no HIV: 01-11-16 NR Hep C: 01-11-16 Neg Screening Labs:  PCP   reports that she has never smoked. She has never used smokeless tobacco. She reports current alcohol use of about 1.0 standard drink of alcohol per week. She reports that she does not use drugs.  Past Medical History:  Diagnosis Date   Allergy    --takes allergy injections   Family history of bladder cancer    Family history of breast cancer    Family history of ovarian cancer    History of COVID-19 05/31/2021   Hyperlipidemia    Osteopenia 2020   Perimenopause     Past Surgical History:  Procedure Laterality Date   BREAST BIOPSY     BREAST SURGERY     DILATION AND CURETTAGE OF UTERUS  10/31/2010   Hysteroscopy with dilation and curettage - Dr. Quincy Simmonds - [facility]   KNEE SURGERY Bilateral    REDUCTION MAMMAPLASTY     REFRACTIVE SURGERY Bilateral     Current Outpatient Medications  Medication Sig Dispense Refill   Ascorbic Acid (VITAMIN C) 100 MG tablet Take 100 mg by  mouth daily.     azelastine (ASTELIN) 0.1 % nasal spray Place 2 sprays into both nostrils 2 (two) times daily. 30 mL 5   benzonatate (TESSALON) 200 MG capsule Take 1 capsule (200 mg total) by mouth 3 (three) times daily as needed for cough. 20 capsule 0   calcium carbonate (OS-CAL) 600 MG TABS tablet      Cholecalciferol (D3-1000) 25 MCG (1000 UT) tablet      Cyanocobalamin (B-12) 1000 MCG TABS      fluticasone (FLONASE) 50 MCG/ACT nasal spray Place 2 sprays into both nostrils daily. 16 g 2   HYDROcodone bit-homatropine (HYDROMET) 5-1.5 MG/5ML syrup Take 5 mLs by mouth every 6 (six) hours as needed for up to 5 days for cough. 120 mL 0   levocetirizine (XYZAL) 5 MG tablet Take 1 tablet (5 mg total) by mouth every evening. appt for further refills 90 tablet 0   lovastatin (MEVACOR) 40 MG tablet Take 1 tablet (40 mg total) by mouth at bedtime. 90 tablet 4   Melatonin 10 MG TABS Take by mouth.     Multiple Vitamins-Minerals (CENTRUM SILVER 50+WOMEN) TABS      traZODone (DESYREL) 50 MG tablet Take 0.5-1 tablets (25-50 mg total) by mouth at bedtime. 30 tablet 4   UNABLE TO FIND Med Name: Allergy Shots  No current facility-administered medications for this visit.    Family History  Problem Relation Age of Onset   Hypertension Mother    Stroke Mother    Cancer Mother 19       ovarian ca, hx of TTP   Hyperlipidemia Mother    Ovarian cancer Mother    Hypertension Father    Cancer Father 36       bladder ca   Hyperlipidemia Father    Dementia Father        lewy body dementia   Anemia Sister    Mental illness Sister        mentally disabled--lives in group home in [place]   Diabetes Brother    Asthma Brother        allergy induced   Ovarian cancer Maternal Grandmother    Breast cancer Cousin        dx 13s    Review of Systems  All other systems reviewed and are negative.  Exam:   BP (!) 142/80   Pulse (!) 57   Resp (!) 98   Ht 5' (1.524 m)   Wt 181 lb (82.1 kg)   LMP  12/31/2009 (Approximate)   BMI 35.35 kg/m     General appearance: alert, cooperative and appears stated age Head: normocephalic, without obvious abnormality, atraumatic Neck: no adenopathy, supple, symmetrical, trachea midline and thyroid normal to inspection and palpation Lungs: clear to auscultation bilaterally Breasts: consistent with reduction, no masses or tenderness, No nipple retraction or dimpling, No nipple discharge or bleeding, No axillary adenopathy Heart: regular rate and rhythm Abdomen: soft, non-tender; no masses, no organomegaly Extremities: extremities normal, atraumatic, no cyanosis or edema Skin: skin color, texture, turgor normal. No rashes or lesions Lymph nodes: cervical, supraclavicular, and axillary nodes normal. Neurologic: grossly normal  Pelvic: External genitalia:  no lesions              No abnormal inguinal nodes palpated.              Urethra:  normal appearing urethra with no masses, tenderness or lesions              Bartholins and Skenes: normal                 Vagina: normal appearing vagina with normal color and discharge, no lesions              Cervix: no lesions              Pap taken: yes Bimanual Exam:  Uterus:  normal size, contour, position, consistency, mobility, non-tender              Adnexa: no mass, fullness, tenderness              Rectal exam: yes.  Confirms.              Anus:  normal sphincter tone, no lesions  Chaperone was present for exam.  Assessment:    Birt-Hogg Dube syndrome.  At increased risk for renal cell cancer, lung cysts and pneumothorax, and skin lesions. FH ovarian cancer in mother.  Patient thinks mother had negative genetic testing.   Patient with normal CT in 2015.  Normal pelvic US in 07/21/20. Pelvic exam, abnormal findings absent.  Cervical cancer screening.  Rectal exam.  Status post bilateral breast reduction.  Screening breast exam. Menopausal female. Osteopenia.   Plan:  We reviewed her genetic  counseling and testing and I read for her  about Birt Hobb Dube syndrome.   We will benefit from active monitoring for renal cancer through her PCP and potentially urology.  She has a Paediatric nurse. Return for pelvic US to check ovaries.  Pap collected. Mammogram screening discussed. Self breast awareness reviewed. BMD this fall. Guidelines for Calcium, Vitamin D, regular exercise program including cardiovascular and weight bearing exercise. Support given for her losses.   Follow up annually and prn.   28 min  total time was spent for this patient encounter, including preparation, face-to-face counseling with the patient, coordination of care, and documentation of the encounter.

## 2021-06-28 ENCOUNTER — Other Ambulatory Visit: Payer: Self-pay

## 2021-06-28 ENCOUNTER — Encounter: Payer: Self-pay | Admitting: Obstetrics and Gynecology

## 2021-06-28 ENCOUNTER — Ambulatory Visit (INDEPENDENT_AMBULATORY_CARE_PROVIDER_SITE_OTHER): Payer: Medicare Other | Admitting: Obstetrics and Gynecology

## 2021-06-28 ENCOUNTER — Telehealth: Payer: Self-pay | Admitting: *Deleted

## 2021-06-28 ENCOUNTER — Other Ambulatory Visit (HOSPITAL_COMMUNITY)
Admission: RE | Admit: 2021-06-28 | Discharge: 2021-06-28 | Disposition: A | Discharge status: Home / Self Care | Payer: Medicare Other | Source: Ambulatory Visit | Attending: Obstetrics and Gynecology | Admitting: Obstetrics and Gynecology

## 2021-06-28 VITALS — BP 142/80 | HR 57 | Resp 98 | Ht 60.0 in | Wt 181.0 lb

## 2021-06-28 DIAGNOSIS — J3089 Other allergic rhinitis: Secondary | ICD-10-CM | POA: Diagnosis not present

## 2021-06-28 DIAGNOSIS — M8580 Other specified disorders of bone density and structure, unspecified site: Secondary | ICD-10-CM | POA: Diagnosis not present

## 2021-06-28 DIAGNOSIS — J301 Allergic rhinitis due to pollen: Secondary | ICD-10-CM | POA: Diagnosis not present

## 2021-06-28 DIAGNOSIS — Z780 Asymptomatic menopausal state: Secondary | ICD-10-CM

## 2021-06-28 DIAGNOSIS — Q8789 Other specified congenital malformation syndromes, not elsewhere classified: Secondary | ICD-10-CM

## 2021-06-28 DIAGNOSIS — Z124 Encounter for screening for malignant neoplasm of cervix: Secondary | ICD-10-CM | POA: Insufficient documentation

## 2021-06-28 DIAGNOSIS — Z01419 Encounter for gynecological examination (general) (routine) without abnormal findings: Secondary | ICD-10-CM | POA: Diagnosis not present

## 2021-06-28 DIAGNOSIS — Z8041 Family history of malignant neoplasm of ovary: Secondary | ICD-10-CM | POA: Diagnosis not present

## 2021-06-28 DIAGNOSIS — Z1239 Encounter for other screening for malignant neoplasm of breast: Secondary | ICD-10-CM

## 2021-06-28 DIAGNOSIS — Z008 Encounter for other general examination: Secondary | ICD-10-CM

## 2021-06-28 DIAGNOSIS — E2839 Other primary ovarian failure: Secondary | ICD-10-CM

## 2021-06-28 NOTE — Patient Instructions (Signed)

## 2021-06-28 NOTE — Telephone Encounter (Signed)
-----   Message from Leamon Arnt, [place] sent at 06/28/2021  9:12 AM EDT ----- Patient would like dexa order to be sent to office she attends in [place]. She has had her Dexa there previous times.

## 2021-06-30 LAB — CYTOLOGY - PAP
Adequacy: ABSENT
Diagnosis: NEGATIVE

## 2021-07-13 DIAGNOSIS — J301 Allergic rhinitis due to pollen: Secondary | ICD-10-CM | POA: Diagnosis not present

## 2021-07-13 DIAGNOSIS — J3089 Other allergic rhinitis: Secondary | ICD-10-CM | POA: Diagnosis not present

## 2021-07-17 DIAGNOSIS — M5411 Radiculopathy, occipito-atlanto-axial region: Secondary | ICD-10-CM | POA: Diagnosis not present

## 2021-07-17 DIAGNOSIS — M9901 Segmental and somatic dysfunction of cervical region: Secondary | ICD-10-CM | POA: Diagnosis not present

## 2021-07-25 DIAGNOSIS — M9901 Segmental and somatic dysfunction of cervical region: Secondary | ICD-10-CM | POA: Diagnosis not present

## 2021-07-25 DIAGNOSIS — M5411 Radiculopathy, occipito-atlanto-axial region: Secondary | ICD-10-CM | POA: Diagnosis not present

## 2021-07-27 DIAGNOSIS — J3089 Other allergic rhinitis: Secondary | ICD-10-CM | POA: Diagnosis not present

## 2021-07-27 DIAGNOSIS — J301 Allergic rhinitis due to pollen: Secondary | ICD-10-CM | POA: Diagnosis not present

## 2021-07-28 ENCOUNTER — Other Ambulatory Visit: Payer: Self-pay | Admitting: Physician Assistant

## 2021-07-28 DIAGNOSIS — E7800 Pure hypercholesterolemia, unspecified: Secondary | ICD-10-CM

## 2021-07-29 ENCOUNTER — Other Ambulatory Visit: Payer: Self-pay | Admitting: Physician Assistant

## 2021-07-29 DIAGNOSIS — F5101 Primary insomnia: Secondary | ICD-10-CM

## 2021-08-01 DIAGNOSIS — M9901 Segmental and somatic dysfunction of cervical region: Secondary | ICD-10-CM | POA: Diagnosis not present

## 2021-08-01 DIAGNOSIS — M5411 Radiculopathy, occipito-atlanto-axial region: Secondary | ICD-10-CM | POA: Diagnosis not present

## 2021-08-06 ENCOUNTER — Encounter: Payer: Self-pay | Admitting: Physician Assistant

## 2021-08-06 DIAGNOSIS — E7800 Pure hypercholesterolemia, unspecified: Secondary | ICD-10-CM

## 2021-08-06 DIAGNOSIS — F5101 Primary insomnia: Secondary | ICD-10-CM

## 2021-08-07 MED ORDER — TRAZODONE HCL 50 MG PO TABS: 25.0000 mg | ORAL_TABLET | Freq: Every day | ORAL | 4 refills | Status: DC

## 2021-08-07 MED ORDER — LOVASTATIN 40 MG PO TABS: 40.0000 mg | ORAL_TABLET | Freq: Every day | ORAL | 4 refills | Status: DC

## 2021-08-08 DIAGNOSIS — M9901 Segmental and somatic dysfunction of cervical region: Secondary | ICD-10-CM | POA: Diagnosis not present

## 2021-08-08 DIAGNOSIS — M5411 Radiculopathy, occipito-atlanto-axial region: Secondary | ICD-10-CM | POA: Diagnosis not present

## 2021-08-10 DIAGNOSIS — J301 Allergic rhinitis due to pollen: Secondary | ICD-10-CM | POA: Diagnosis not present

## 2021-08-10 DIAGNOSIS — J3089 Other allergic rhinitis: Secondary | ICD-10-CM | POA: Diagnosis not present

## 2021-08-15 DIAGNOSIS — M9901 Segmental and somatic dysfunction of cervical region: Secondary | ICD-10-CM | POA: Diagnosis not present

## 2021-08-15 DIAGNOSIS — M5411 Radiculopathy, occipito-atlanto-axial region: Secondary | ICD-10-CM | POA: Diagnosis not present

## 2021-08-22 DIAGNOSIS — M5411 Radiculopathy, occipito-atlanto-axial region: Secondary | ICD-10-CM | POA: Diagnosis not present

## 2021-08-22 DIAGNOSIS — M9901 Segmental and somatic dysfunction of cervical region: Secondary | ICD-10-CM | POA: Diagnosis not present

## 2021-08-30 DIAGNOSIS — J301 Allergic rhinitis due to pollen: Secondary | ICD-10-CM | POA: Diagnosis not present

## 2021-08-30 DIAGNOSIS — J3089 Other allergic rhinitis: Secondary | ICD-10-CM | POA: Diagnosis not present

## 2021-08-31 ENCOUNTER — Other Ambulatory Visit: Payer: Self-pay

## 2021-08-31 ENCOUNTER — Encounter: Payer: Self-pay | Admitting: Obstetrics and Gynecology

## 2021-08-31 ENCOUNTER — Ambulatory Visit: Payer: Medicare Other | Admitting: Obstetrics and Gynecology

## 2021-08-31 ENCOUNTER — Ambulatory Visit (INDEPENDENT_AMBULATORY_CARE_PROVIDER_SITE_OTHER): Payer: Medicare Other

## 2021-08-31 VITALS — BP 156/78 | HR 56 | Ht 60.0 in | Wt 181.0 lb

## 2021-08-31 DIAGNOSIS — Q8789 Other specified congenital malformation syndromes, not elsewhere classified: Secondary | ICD-10-CM | POA: Diagnosis not present

## 2021-08-31 DIAGNOSIS — Z8041 Family history of malignant neoplasm of ovary: Secondary | ICD-10-CM | POA: Diagnosis not present

## 2021-08-31 DIAGNOSIS — M5411 Radiculopathy, occipito-atlanto-axial region: Secondary | ICD-10-CM | POA: Diagnosis not present

## 2021-08-31 DIAGNOSIS — M9901 Segmental and somatic dysfunction of cervical region: Secondary | ICD-10-CM | POA: Diagnosis not present

## 2021-08-31 NOTE — Progress Notes (Signed)
GYNECOLOGY  VISIT   HPI: 66 y.o.   Widowed  Caucasian  female   G5P2011 with Patient's last menstrual period was 12/31/2009 (approximate).   here for pelvic ultrasound due to family history of ovarian cancer.   No problems today. No bleeding.   GYNECOLOGIC HISTORY: Patient's last menstrual period was 12/31/2009 (approximate). Contraception:  PMP Menopausal hormone therapy:  none Last mammogram: 11-02-20 3D/Neg:BiRads1 Last pap smear: 06-28-21 Neg, 03-06-17 Neg:Neg HR HPV, 02/09/15 Neg:Neg HR HPV        OB History     Gravida  3   Para  2   Term  2   Preterm      AB  1   Living  1      SAB      IAB      Ectopic      Multiple      Live Births                 Patient Active Problem List   Diagnosis Date Noted   DDD (degenerative disc disease), cervical 05/08/2021   DDD (degenerative disc disease), lumbar 05/08/2021   Genetic testing 02/13/2021   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   Family history of ovarian cancer    Family history of breast cancer    Family history of bladder cancer    Adjustment disorder with mixed anxiety and depressed mood 02/26/2020   Allergy to mold 01/22/2020   Stress due to family tension 11/09/2019   OSA (obstructive sleep apnea) 11/09/2019   Epigastric pain 01/28/2019   Gastroesophageal reflux disease with esophagitis 01/28/2019   Newly recognized heart murmur 01/28/2019   Bradycardia 01/28/2019   Hematuria 05/27/2018   Overweight (BMI 25.0-29.9) 05/27/2018   Rhinitis, allergic 06/11/2016   Class 1 obesity due to excess calories without serious comorbidity with body mass index (BMI) of 30.0 to 30.9 in adult 01/11/2016   Squamous cell carcinoma 09/21/2014   Chronic allergic rhinitis 05/30/2014   Elevated LDL cholesterol level 04/14/2014   Allergic rhinitis 04/14/2014   Seasonal allergies 04/14/2014   OAB (overactive bladder) 04/14/2014    Past Medical History:  Diagnosis Date   Allergy    --takes allergy injections    Family history of bladder cancer    Family history of breast cancer    Family history of ovarian cancer    History of COVID-19 05/31/2021   Hyperlipidemia    Osteopenia 2020   Perimenopause     Past Surgical History:  Procedure Laterality Date   BREAST BIOPSY     BREAST SURGERY     DILATION AND CURETTAGE OF UTERUS  10/31/2010   Hysteroscopy with dilation and curettage - Dr. Quincy Simmonds - [facility]   KNEE SURGERY Bilateral    REDUCTION MAMMAPLASTY     REFRACTIVE SURGERY Bilateral     Current Outpatient Medications  Medication Sig Dispense Refill   Ascorbic Acid (VITAMIN C) 100 MG tablet Take 100 mg by mouth daily.     azelastine (ASTELIN) 0.1 % nasal spray Place 2 sprays into both nostrils 2 (two) times daily. 30 mL 5   calcium carbonate (OS-CAL) 600 MG TABS tablet      Cholecalciferol (D3-1000) 25 MCG (1000 UT) tablet      Cyanocobalamin (B-12) 1000 MCG TABS      fluticasone (FLONASE) 50 MCG/ACT nasal spray Place 2 sprays into both nostrils daily. 16 g 2   levocetirizine (XYZAL) 5 MG tablet Take 1 tablet (5 mg total) by mouth every evening.  appt for further refills 90 tablet 0   lovastatin (MEVACOR) 40 MG tablet Take 1 tablet (40 mg total) by mouth at bedtime. 90 tablet 0   Melatonin 10 MG TABS Take by mouth.     Multiple Vitamins-Minerals (CENTRUM SILVER 50+WOMEN) TABS      traZODone (DESYREL) 50 MG tablet Take 0.5-1 tablets (25-50 mg total) by mouth at bedtime. 30 tablet 4   UNABLE TO FIND Med Name: Allergy Shots     No current facility-administered medications for this visit.     ALLERGIES: Gramineae pollens, Molds & smuts, Tree extract, and Dust mite extract  Family History  Problem Relation Age of Onset   Hypertension Mother    Stroke Mother    Cancer Mother 36       ovarian ca, hx of TTP   Hyperlipidemia Mother    Ovarian cancer Mother    Hypertension Father    Cancer Father 46       bladder ca   Hyperlipidemia Father    Dementia Father        lewy body dementia    Anemia Sister    Mental illness Sister        mentally disabled--lives in group home in [place]   Diabetes Brother    Asthma Brother        allergy induced   Ovarian cancer Maternal Grandmother    Breast cancer Cousin        dx 77s    Social History   Socioeconomic History   Marital status: Widowed    Spouse name: Not on file   Number of children: 2   Years of education: Not on file   Highest education level: Not on file  Occupational History   Not on file  Tobacco Use   Smoking status: Never   Smokeless tobacco: Never  Vaping Use   Vaping Use: Never used  Substance and Sexual Activity   Alcohol use: Yes    Alcohol/week: 1.0 standard drink    Types: 1 Glasses of wine per week   Drug use: Never   Sexual activity: Not Currently    Partners: Male    Birth control/protection: Post-menopausal  Other Topics Concern   Not on file  Social History Narrative   Not on file   Social Determinants of Health   Financial Resource Strain: Not on file  Food Insecurity: Not on file  Transportation Needs: Not on file  Physical Activity: Not on file  Stress: Not on file  Social Connections: Not on file  Intimate Partner Violence: Not on file    Review of Systems  All other systems reviewed and are negative.  PHYSICAL EXAMINATION:    BP (!) 156/78   Pulse (!) 56   Ht 5' (1.524 m)   Wt 181 lb (82.1 kg)   LMP 12/31/2009 (Approximate)   SpO2 97%   BMI 35.35 kg/m     General appearance: alert, cooperative and appears stated age    Pelvic US  Uterus 4.04 x 3.28 cm.  No myometrial masses. EMS 7.83 mm. No masses.  Ovaries normal.  No adnexal masses.  No free fluid.   ASSESSMENT  FH ovarian cancer.  Normal pelvic US.  EMS 7.83.  Birt-Hogg Dube syndrome.  At increased risk for renal cell cancer, lung cysts and pneumothorax, and skin lesions.  PLAN  Normal pelvic ultrasound.  Images and report reviewed.  No EMB needed as she does not have any vaginal bleeding.   CA125  today.  Continue yearly pelvic ultrasound.  She understand she is at increase risk for ovarian cancer even if she tested negative for genetic mutations that directly affect her ovarian health. Fu for yearly exam and prn.   Fu for annual exam and prn.    An After Visit Summary was printed and given to the patient.  19 min  total time was spent for this patient encounter, including preparation, face-to-face counseling with the patient, coordination of care, and documentation of the encounter.

## 2021-09-01 LAB — CA 125: CA 125: 10 U/mL (ref <35)

## 2021-09-02 ENCOUNTER — Encounter: Payer: Self-pay | Admitting: Physician Assistant

## 2021-09-02 DIAGNOSIS — Z1211 Encounter for screening for malignant neoplasm of colon: Secondary | ICD-10-CM

## 2021-09-05 DIAGNOSIS — M5411 Radiculopathy, occipito-atlanto-axial region: Secondary | ICD-10-CM | POA: Diagnosis not present

## 2021-09-05 DIAGNOSIS — M9901 Segmental and somatic dysfunction of cervical region: Secondary | ICD-10-CM | POA: Diagnosis not present

## 2021-09-12 DIAGNOSIS — G4733 Obstructive sleep apnea (adult) (pediatric): Secondary | ICD-10-CM | POA: Diagnosis not present

## 2021-09-20 DIAGNOSIS — J3089 Other allergic rhinitis: Secondary | ICD-10-CM | POA: Diagnosis not present

## 2021-09-20 DIAGNOSIS — J301 Allergic rhinitis due to pollen: Secondary | ICD-10-CM | POA: Diagnosis not present

## 2021-09-21 DIAGNOSIS — M5411 Radiculopathy, occipito-atlanto-axial region: Secondary | ICD-10-CM | POA: Diagnosis not present

## 2021-09-21 DIAGNOSIS — M9901 Segmental and somatic dysfunction of cervical region: Secondary | ICD-10-CM | POA: Diagnosis not present

## 2021-10-02 ENCOUNTER — Encounter: Payer: Self-pay | Admitting: Obstetrics and Gynecology

## 2021-10-04 ENCOUNTER — Encounter: Payer: Self-pay | Admitting: Physician Assistant

## 2021-10-04 DIAGNOSIS — J301 Allergic rhinitis due to pollen: Secondary | ICD-10-CM | POA: Diagnosis not present

## 2021-10-04 DIAGNOSIS — M9901 Segmental and somatic dysfunction of cervical region: Secondary | ICD-10-CM | POA: Diagnosis not present

## 2021-10-04 DIAGNOSIS — M5411 Radiculopathy, occipito-atlanto-axial region: Secondary | ICD-10-CM | POA: Diagnosis not present

## 2021-10-04 DIAGNOSIS — J3089 Other allergic rhinitis: Secondary | ICD-10-CM | POA: Diagnosis not present

## 2021-10-12 DIAGNOSIS — G4733 Obstructive sleep apnea (adult) (pediatric): Secondary | ICD-10-CM | POA: Diagnosis not present

## 2021-10-18 DIAGNOSIS — M9901 Segmental and somatic dysfunction of cervical region: Secondary | ICD-10-CM | POA: Diagnosis not present

## 2021-10-18 DIAGNOSIS — M5411 Radiculopathy, occipito-atlanto-axial region: Secondary | ICD-10-CM | POA: Diagnosis not present

## 2021-10-21 ENCOUNTER — Other Ambulatory Visit: Payer: Self-pay | Admitting: Physician Assistant

## 2021-10-21 DIAGNOSIS — J309 Allergic rhinitis, unspecified: Secondary | ICD-10-CM

## 2021-10-26 ENCOUNTER — Ambulatory Visit: Payer: Self-pay | Admitting: Physician Assistant

## 2021-10-26 DIAGNOSIS — J301 Allergic rhinitis due to pollen: Secondary | ICD-10-CM | POA: Diagnosis not present

## 2021-10-26 DIAGNOSIS — J3089 Other allergic rhinitis: Secondary | ICD-10-CM | POA: Diagnosis not present

## 2021-10-26 DIAGNOSIS — Z91199 Patient's noncompliance with other medical treatment and regimen due to unspecified reason: Secondary | ICD-10-CM

## 2021-10-26 NOTE — Progress Notes (Signed)
No show

## 2021-10-27 ENCOUNTER — Encounter: Payer: BLUE CROSS/BLUE SHIELD | Admitting: Physician Assistant

## 2021-10-30 ENCOUNTER — Ambulatory Visit (INDEPENDENT_AMBULATORY_CARE_PROVIDER_SITE_OTHER): Payer: Medicare Other | Admitting: Physician Assistant

## 2021-10-30 ENCOUNTER — Other Ambulatory Visit: Payer: Self-pay

## 2021-10-30 DIAGNOSIS — F5101 Primary insomnia: Secondary | ICD-10-CM | POA: Diagnosis not present

## 2021-10-30 DIAGNOSIS — Z0000 Encounter for general adult medical examination without abnormal findings: Secondary | ICD-10-CM | POA: Diagnosis not present

## 2021-10-30 DIAGNOSIS — Z13220 Encounter for screening for lipoid disorders: Secondary | ICD-10-CM

## 2021-10-30 DIAGNOSIS — G8929 Other chronic pain: Secondary | ICD-10-CM

## 2021-10-30 DIAGNOSIS — Z131 Encounter for screening for diabetes mellitus: Secondary | ICD-10-CM | POA: Diagnosis not present

## 2021-10-30 DIAGNOSIS — E7800 Pure hypercholesterolemia, unspecified: Secondary | ICD-10-CM | POA: Diagnosis not present

## 2021-10-30 DIAGNOSIS — Z1329 Encounter for screening for other suspected endocrine disorder: Secondary | ICD-10-CM

## 2021-10-30 DIAGNOSIS — Z23 Encounter for immunization: Secondary | ICD-10-CM

## 2021-10-30 DIAGNOSIS — M545 Low back pain, unspecified: Secondary | ICD-10-CM | POA: Diagnosis not present

## 2021-10-30 DIAGNOSIS — J309 Allergic rhinitis, unspecified: Secondary | ICD-10-CM

## 2021-10-30 DIAGNOSIS — Z6833 Body mass index (BMI) 33.0-33.9, adult: Secondary | ICD-10-CM

## 2021-10-30 DIAGNOSIS — E6609 Other obesity due to excess calories: Secondary | ICD-10-CM

## 2021-10-30 MED ORDER — PREDNISONE 50 MG PO TABS: ORAL_TABLET | ORAL | 0 refills | Status: DC

## 2021-10-30 MED ORDER — TRAZODONE HCL 50 MG PO TABS: 25.0000 mg | ORAL_TABLET | Freq: Every day | ORAL | 3 refills | Status: DC

## 2021-10-30 MED ORDER — LOVASTATIN 40 MG PO TABS: 40.0000 mg | ORAL_TABLET | Freq: Every day | ORAL | 3 refills | Status: DC

## 2021-10-30 MED ORDER — LEVOCETIRIZINE DIHYDROCHLORIDE 5 MG PO TABS: 5.0000 mg | ORAL_TABLET | Freq: Every evening | ORAL | 3 refills | Status: DC

## 2021-10-30 NOTE — Patient Instructions (Addendum)
Tens unit  Health Maintenance After Age 65 After age 65, you are at a higher risk for certain long-term diseases and infections as well as injuries from falls. Falls are a major cause of broken bones and head injuries in people who are older than age 65. Getting regular preventive care can help to keep you healthy and well. Preventive care includes getting regular testing and making lifestyle changes as recommended by your health care provider. Talk with your health care provider about: Which screenings and tests you should have. A screening is a test that checks for a disease when you have no symptoms. A diet and exercise plan that is right for you. What should I know about screenings and tests to prevent falls? Screening and testing are the best ways to find a health problem early. Early diagnosis and treatment give you the best chance of managing medical conditions that are common after age 65. Certain conditions and lifestyle choices may make you more likely to have a fall. Your health care provider may recommend: Regular vision checks. Poor vision and conditions such as cataracts can make you more likely to have a fall. If you wear glasses, make sure to get your prescription updated if your vision changes. Medicine review. Work with your health care provider to regularly review all of the medicines you are taking, including over-the-counter medicines. Ask your health care provider about any side effects that may make you more likely to have a fall. Tell your health care provider if any medicines that you take make you feel dizzy or sleepy. Osteoporosis screening. Osteoporosis is a condition that causes the bones to get weaker. This can make the bones weak and cause them to break more easily. Blood pressure screening. Blood pressure changes and medicines to control blood pressure can make you feel dizzy. Strength and balance checks. Your health care provider may recommend certain tests to check your  strength and balance while standing, walking, or changing positions. Foot health exam. Foot pain and numbness, as well as not wearing proper footwear, can make you more likely to have a fall. Depression screening. You may be more likely to have a fall if you have a fear of falling, feel emotionally low, or feel unable to do activities that you used to do. Alcohol use screening. Using too much alcohol can affect your balance and may make you more likely to have a fall. What actions can I take to lower my risk of falls? General instructions Talk with your health care provider about your risks for falling. Tell your health care provider if: You fall. Be sure to tell your health care provider about all falls, even ones that seem minor. You feel dizzy, sleepy, or off-balance. Take over-the-counter and prescription medicines only as told by your health care provider. These include any supplements. Eat a healthy diet and maintain a healthy weight. A healthy diet includes low-fat dairy products, low-fat (lean) meats, and fiber from whole grains, beans, and lots of fruits and vegetables. Home safety Remove any tripping hazards, such as rugs, cords, and clutter. Install safety equipment such as grab bars in bathrooms and safety rails on stairs. Keep rooms and walkways well-lit. Activity  Follow a regular exercise program to stay fit. This will help you maintain your balance. Ask your health care provider what types of exercise are appropriate for you. If you need a cane or walker, use it as recommended by your health care provider. Wear supportive shoes that have nonskid soles.  Lifestyle Do not drink alcohol if your health care provider tells you not to drink. If you drink alcohol, limit how much you have: 0-1 drink a day for women. 0-2 drinks a day for men. Be aware of how much alcohol is in your drink. In the [place] one drink equals one typical bottle of beer (12 oz), one-half glass of wine (5 oz),  or one shot of hard liquor (1 oz). Do not use any products that contain nicotine or tobacco, such as cigarettes and e-cigarettes. If you need help quitting, ask your health care provider. Summary Having a healthy lifestyle and getting preventive care can help to protect your health and wellness after age 65. Screening and testing are the best way to find a health problem early and help you avoid having a fall. Early diagnosis and treatment give you the best chance for managing medical conditions that are more common for people who are older than age 65. Falls are a major cause of broken bones and head injuries in people who are older than age 65. Take precautions to prevent a fall at home. Work with your health care provider to learn what changes you can make to improve your health and wellness and to prevent falls. This information is not intended to replace advice given to you by your health care provider. Make sure you discuss any questions you have with your health care provider. Document Revised: 02/24/2021 Document Reviewed: 12/02/2020 Elsevier Patient Education  2022 [employer].

## 2021-10-30 NOTE — Progress Notes (Signed)
Subjective:     Rebekah [name] is a 66 y.o. female and is here for a comprehensive physical exam. The patient reports problems - left low back pain intermittent and catching for 3 weeks.  .  Social History   Socioeconomic History   Marital status: Widowed    Spouse name: Not on file   Number of children: 2   Years of education: Not on file   Highest education level: Not on file  Occupational History   Not on file  Tobacco Use   Smoking status: Never   Smokeless tobacco: Never  Vaping Use   Vaping Use: Never used  Substance and Sexual Activity   Alcohol use: Yes    Alcohol/week: 1.0 standard drink    Types: 1 Glasses of wine per week   Drug use: Never   Sexual activity: Not Currently    Partners: Male    Birth control/protection: Post-menopausal  Other Topics Concern   Not on file  Social History Narrative   Not on file   Social Determinants of Health   Financial Resource Strain: Not on file  Food Insecurity: Not on file  Transportation Needs: Not on file  Physical Activity: Not on file  Stress: Not on file  Social Connections: Not on file  Intimate Partner Violence: Not on file   Health Maintenance  Topic Date Due   DEXA SCAN  10/27/2021   COVID-19 Vaccine (4 - Booster for Moderna series) 11/15/2021 (Originally 01/30/2021)   MAMMOGRAM  11/02/2022   TETANUS/TDAP  02/08/2025   COLONOSCOPY (Pts 45-53yrs Insurance coverage will need to be confirmed)  01/24/2026   PAP SMEAR-Modifier  06/28/2026   Pneumonia Vaccine 42+ Years old  Completed   INFLUENZA VACCINE  Completed   Hepatitis C Screening  Completed   HIV Screening  Completed   Zoster Vaccines- Shingrix  Completed   HPV VACCINES  Aged Out    The following portions of the patient's history were reviewed and updated as appropriate: allergies, current medications, past family history, past medical history, past social history, past surgical history, and problem list.  Review of Systems Pertinent items noted in  HPI and remainder of comprehensive ROS otherwise negative.   Objective:    LMP 12/31/2009 (Approximate)  General appearance: alert, cooperative, and appears stated age Head: Normocephalic, without obvious abnormality, atraumatic Eyes: conjunctivae/corneas clear. PERRL, EOM's intact. Fundi benign. Ears: normal TM's and external ear canals both ears Nose: Nares normal. Septum midline. Mucosa normal. No drainage or sinus tenderness. Throat: lips, mucosa, and tongue normal; teeth and gums normal Neck: no adenopathy, no carotid bruit, no JVD, supple, symmetrical, trachea midline, and thyroid not enlarged, symmetric, no tenderness/mass/nodules Back: symmetric, no curvature. ROM normal. No CVA tenderness. Lungs: clear to auscultation bilaterally Heart: regular rate and rhythm, S1, S2 normal, no murmur, click, rub or gallop Abdomen: soft, non-tender; bowel sounds normal; no masses,  no organomegaly Extremities: extremities normal, atraumatic, no cyanosis or edema Pulses: 2+ and symmetric Skin: Skin color, texture, turgor normal. No rashes or lesions Lymph nodes: Cervical, supraclavicular, and axillary nodes normal. Neurologic: Grossly normal   .. Depression screen [facility] 2/9 10/30/2021 10/21/2020 02/22/2020 11/09/2019 01/28/2019  Decreased Interest 0 0 3 1 1   Down, Depressed, Hopeless 0 0 3 1 0  PHQ - 2 Score 0 0 6 2 1   Altered sleeping - - 3 1 2   Tired, decreased energy - - 3 1 0  Change in appetite - - 2 1 0  Feeling bad or  failure about yourself  - - 1 1 0  Trouble concentrating - - 3 0 1  Moving slowly or fidgety/restless - - 3 0 0  Suicidal thoughts - - 0 0 0  PHQ-9 Score - - 21 6 4   Difficult doing work/chores - - Somewhat difficult Not difficult at all Not difficult at all    Assessment:    Healthy female exam.     Plan:    Marland KitchenMarland KitchenArwyn was seen today for annual exam.  Diagnoses and all orders for this visit:  Routine physical examination -     TSH -     Lipid Panel w/reflex  Direct LDL -     COMPLETE METABOLIC PANEL WITH GFR -     CBC with Differential/Platelet  Elevated LDL cholesterol level -     Lipid Panel w/reflex Direct LDL -     lovastatin (MEVACOR) 40 MG tablet; Take 1 tablet (40 mg total) by mouth at bedtime.  Screening for lipid disorders  Screening for diabetes mellitus -     COMPLETE METABOLIC PANEL WITH GFR  Screening for thyroid disorder  Flu vaccine need -     Flu Vaccine QUAD High Dose(Fluad)  Need for pneumococcal vaccination -     Pneumococcal conjugate vaccine 20-valent (Prevnar 20)  Chronic allergic rhinitis -     CBC with Differential/Platelet -     levocetirizine (XYZAL) 5 MG tablet; Take 1 tablet (5 mg total) by mouth every evening.  Primary insomnia -     traZODone (DESYREL) 50 MG tablet; Take 0.5-1 tablets (25-50 mg total) by mouth at bedtime.  Chronic bilateral low back pain without sciatica -     predniSONE (DELTASONE) 50 MG tablet; One tab PO daily for 5 days.  .. Discussed 150 minutes of exercise a week.  Encouraged vitamin D 1000 units and Calcium 1300mg  or 4 servings of dairy a day.  Fasting labs ordered today.  PHQ not concerning.  Vitals look good.  Flu and pneumonia vaccine given today.  Covid vaccine x3. [place] for next booster.  Need mammogram in November 2021.  Bone density ordered.  Colonoscopy UTD.   Discussed low back pain. Given prednisone for 5 days. If pain continues needs to see sports medicine. Discussed massage, tens unit, icy hot patches, good posture, NSAIDs, muscle relaxer. Consider tens ball in car and when sitting.  Consider formal PT.  Prednisone burst given today.   Marland Kitchen.Discussed low carb diet with 1500 calories and 80g of protein.  Exercising at least 150 minutes a week.  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  Needs to lose weight and get BMI under 30.    See After Visit Summary for Counseling Recommendations

## 2021-11-01 DIAGNOSIS — Z0000 Encounter for general adult medical examination without abnormal findings: Secondary | ICD-10-CM | POA: Diagnosis not present

## 2021-11-01 DIAGNOSIS — Z9989 Dependence on other enabling machines and devices: Secondary | ICD-10-CM | POA: Diagnosis not present

## 2021-11-01 DIAGNOSIS — G4733 Obstructive sleep apnea (adult) (pediatric): Secondary | ICD-10-CM | POA: Diagnosis not present

## 2021-11-01 DIAGNOSIS — Z72820 Sleep deprivation: Secondary | ICD-10-CM | POA: Diagnosis not present

## 2021-11-01 DIAGNOSIS — Z131 Encounter for screening for diabetes mellitus: Secondary | ICD-10-CM | POA: Diagnosis not present

## 2021-11-01 DIAGNOSIS — E7800 Pure hypercholesterolemia, unspecified: Secondary | ICD-10-CM | POA: Diagnosis not present

## 2021-11-01 DIAGNOSIS — J309 Allergic rhinitis, unspecified: Secondary | ICD-10-CM | POA: Diagnosis not present

## 2021-11-01 LAB — COMPLETE METABOLIC PANEL WITH GFR
AG Ratio: 1.8 (calc) (ref 1.0–2.5)
ALT: 21 U/L (ref 6–29)
AST: 21 U/L (ref 10–35)
Albumin: 4.5 g/dL (ref 3.6–5.1)
Alkaline phosphatase (APISO): 61 U/L (ref 37–153)
BUN: 11 mg/dL (ref 7–25)
CO2: 29 mmol/L (ref 20–32)
Calcium: 9.3 mg/dL (ref 8.6–10.4)
Chloride: 107 mmol/L (ref 98–110)
Creat: 0.76 mg/dL (ref 0.50–1.05)
Globulin: 2.5 g/dL (calc) (ref 1.9–3.7)
Glucose, Bld: 90 mg/dL (ref 65–99)
Potassium: 3.7 mmol/L (ref 3.5–5.3)
Sodium: 144 mmol/L (ref 135–146)
Total Bilirubin: 0.5 mg/dL (ref 0.2–1.2)
Total Protein: 7 g/dL (ref 6.1–8.1)
eGFR: 87 mL/min/{1.73_m2} (ref >=60)

## 2021-11-01 LAB — CBC WITH DIFFERENTIAL/PLATELET
Absolute Monocytes: 448 cells/uL (ref 200–950)
Basophils Absolute: 51 cells/uL (ref 0–200)
Basophils Relative: 0.8 %
Eosinophils Absolute: 109 cells/uL (ref 15–500)
Eosinophils Relative: 1.7 %
HCT: 40.3 % (ref 35.0–45.0)
Hemoglobin: 13.2 g/dL (ref 11.7–15.5)
Lymphs Abs: 1363 cells/uL (ref 850–3900)
MCH: 29.3 pg (ref 27.0–33.0)
MCHC: 32.8 g/dL (ref 32.0–36.0)
MCV: 89.4 fL (ref 80.0–100.0)
MPV: 12.7 fL — ABNORMAL HIGH (ref 7.5–12.5)
Monocytes Relative: 7 %
Neutro Abs: 4429 cells/uL (ref 1500–7800)
Neutrophils Relative %: 69.2 %
Platelets: 206 10*3/uL (ref 140–400)
RBC: 4.51 10*6/uL (ref 3.80–5.10)
RDW: 12.6 % (ref 11.0–15.0)
Total Lymphocyte: 21.3 %
WBC: 6.4 10*3/uL (ref 3.8–10.8)

## 2021-11-01 LAB — LIPID PANEL W/REFLEX DIRECT LDL
Cholesterol: 194 mg/dL (ref <200)
HDL: 59 mg/dL (ref >=50)
LDL Cholesterol (Calc): 107 mg/dL (calc) — ABNORMAL HIGH
Non-HDL Cholesterol (Calc): 135 mg/dL (calc) — ABNORMAL HIGH (ref <130)
Total CHOL/HDL Ratio: 3.3 (calc) (ref <5.0)
Triglycerides: 168 mg/dL — ABNORMAL HIGH (ref <150)

## 2021-11-01 LAB — TSH: TSH: 2.25 mIU/L (ref 0.40–4.50)

## 2021-11-02 NOTE — Progress Notes (Signed)
Shams,  Thyroid looks good.  Kidney, liver, glucose look great.  Hemoglobin and WBC look great.  LDL improved.  HDL, good cholesterol looks great.  TG are a little elevated. Watch your sugars and carbs and get 150 minutes of exercise a week. Consider fish oil 4000mg  a day.

## 2021-11-06 ENCOUNTER — Encounter: Payer: Self-pay | Admitting: Physician Assistant

## 2021-11-06 DIAGNOSIS — M545 Low back pain, unspecified: Secondary | ICD-10-CM | POA: Insufficient documentation

## 2021-11-06 DIAGNOSIS — F5101 Primary insomnia: Secondary | ICD-10-CM | POA: Insufficient documentation

## 2021-11-06 DIAGNOSIS — G8929 Other chronic pain: Secondary | ICD-10-CM | POA: Insufficient documentation

## 2021-11-07 ENCOUNTER — Other Ambulatory Visit: Payer: Self-pay | Admitting: Physician Assistant

## 2021-11-07 DIAGNOSIS — M9901 Segmental and somatic dysfunction of cervical region: Secondary | ICD-10-CM | POA: Diagnosis not present

## 2021-11-07 DIAGNOSIS — Z1231 Encounter for screening mammogram for malignant neoplasm of breast: Secondary | ICD-10-CM

## 2021-11-07 DIAGNOSIS — M5411 Radiculopathy, occipito-atlanto-axial region: Secondary | ICD-10-CM | POA: Diagnosis not present

## 2021-11-08 ENCOUNTER — Encounter: Payer: Self-pay | Admitting: Physician Assistant

## 2021-11-08 ENCOUNTER — Ambulatory Visit (INDEPENDENT_AMBULATORY_CARE_PROVIDER_SITE_OTHER): Payer: Medicare Other

## 2021-11-08 ENCOUNTER — Ambulatory Visit: Payer: Medicare Other

## 2021-11-08 ENCOUNTER — Other Ambulatory Visit: Payer: Self-pay

## 2021-11-08 DIAGNOSIS — E2839 Other primary ovarian failure: Secondary | ICD-10-CM | POA: Diagnosis not present

## 2021-11-08 DIAGNOSIS — M8580 Other specified disorders of bone density and structure, unspecified site: Secondary | ICD-10-CM | POA: Diagnosis not present

## 2021-11-08 DIAGNOSIS — Z780 Asymptomatic menopausal state: Secondary | ICD-10-CM | POA: Diagnosis not present

## 2021-11-08 DIAGNOSIS — M85851 Other specified disorders of bone density and structure, right thigh: Secondary | ICD-10-CM | POA: Diagnosis not present

## 2021-11-08 DIAGNOSIS — Z1231 Encounter for screening mammogram for malignant neoplasm of breast: Secondary | ICD-10-CM

## 2021-11-08 NOTE — Telephone Encounter (Signed)
Rebekah [name] is just going to call [facility] to schedule colonoscopy.

## 2021-11-09 NOTE — Progress Notes (Signed)
Normal mammogram. Follow up in 1 year.

## 2021-11-12 DIAGNOSIS — G4733 Obstructive sleep apnea (adult) (pediatric): Secondary | ICD-10-CM | POA: Diagnosis not present

## 2021-11-14 DIAGNOSIS — M9901 Segmental and somatic dysfunction of cervical region: Secondary | ICD-10-CM | POA: Diagnosis not present

## 2021-11-14 DIAGNOSIS — M5411 Radiculopathy, occipito-atlanto-axial region: Secondary | ICD-10-CM | POA: Diagnosis not present

## 2021-11-15 ENCOUNTER — Other Ambulatory Visit: Payer: Self-pay | Admitting: Obstetrics and Gynecology

## 2021-11-15 ENCOUNTER — Other Ambulatory Visit: Payer: Self-pay

## 2021-11-15 DIAGNOSIS — Z780 Asymptomatic menopausal state: Secondary | ICD-10-CM

## 2021-11-16 DIAGNOSIS — J301 Allergic rhinitis due to pollen: Secondary | ICD-10-CM | POA: Diagnosis not present

## 2021-11-16 DIAGNOSIS — J3089 Other allergic rhinitis: Secondary | ICD-10-CM | POA: Diagnosis not present

## 2021-11-18 ENCOUNTER — Other Ambulatory Visit: Payer: Self-pay | Admitting: Physician Assistant

## 2021-11-18 DIAGNOSIS — J309 Allergic rhinitis, unspecified: Secondary | ICD-10-CM

## 2021-11-28 DIAGNOSIS — M5411 Radiculopathy, occipito-atlanto-axial region: Secondary | ICD-10-CM | POA: Diagnosis not present

## 2021-11-28 DIAGNOSIS — M9901 Segmental and somatic dysfunction of cervical region: Secondary | ICD-10-CM | POA: Diagnosis not present

## 2021-12-07 DIAGNOSIS — J3089 Other allergic rhinitis: Secondary | ICD-10-CM | POA: Diagnosis not present

## 2021-12-07 DIAGNOSIS — J301 Allergic rhinitis due to pollen: Secondary | ICD-10-CM | POA: Diagnosis not present

## 2021-12-12 DIAGNOSIS — G4733 Obstructive sleep apnea (adult) (pediatric): Secondary | ICD-10-CM | POA: Diagnosis not present

## 2021-12-19 DIAGNOSIS — D225 Melanocytic nevi of trunk: Secondary | ICD-10-CM | POA: Diagnosis not present

## 2021-12-19 DIAGNOSIS — M5411 Radiculopathy, occipito-atlanto-axial region: Secondary | ICD-10-CM | POA: Diagnosis not present

## 2021-12-19 DIAGNOSIS — L718 Other rosacea: Secondary | ICD-10-CM | POA: Diagnosis not present

## 2021-12-19 DIAGNOSIS — D1801 Hemangioma of skin and subcutaneous tissue: Secondary | ICD-10-CM | POA: Diagnosis not present

## 2021-12-19 DIAGNOSIS — L918 Other hypertrophic disorders of the skin: Secondary | ICD-10-CM | POA: Diagnosis not present

## 2021-12-19 DIAGNOSIS — L814 Other melanin hyperpigmentation: Secondary | ICD-10-CM | POA: Diagnosis not present

## 2021-12-19 DIAGNOSIS — Z85828 Personal history of other malignant neoplasm of skin: Secondary | ICD-10-CM | POA: Diagnosis not present

## 2021-12-19 DIAGNOSIS — M9901 Segmental and somatic dysfunction of cervical region: Secondary | ICD-10-CM | POA: Diagnosis not present

## 2021-12-19 DIAGNOSIS — L820 Inflamed seborrheic keratosis: Secondary | ICD-10-CM | POA: Diagnosis not present

## 2021-12-19 DIAGNOSIS — L700 Acne vulgaris: Secondary | ICD-10-CM | POA: Diagnosis not present

## 2021-12-19 DIAGNOSIS — D2272 Melanocytic nevi of left lower limb, including hip: Secondary | ICD-10-CM | POA: Diagnosis not present

## 2021-12-19 DIAGNOSIS — L503 Dermatographic urticaria: Secondary | ICD-10-CM | POA: Diagnosis not present

## 2021-12-19 DIAGNOSIS — L821 Other seborrheic keratosis: Secondary | ICD-10-CM | POA: Diagnosis not present

## 2021-12-20 ENCOUNTER — Emergency Department
Admission: RE | Admit: 2021-12-20 | Discharge: 2021-12-20 | Disposition: A | Discharge status: Home / Self Care | Payer: Medicare Other | Source: Ambulatory Visit

## 2021-12-20 ENCOUNTER — Other Ambulatory Visit: Payer: Self-pay

## 2021-12-20 VITALS — BP 169/71 | HR 60 | Temp 98.0°F | Resp 17

## 2021-12-20 DIAGNOSIS — R059 Cough, unspecified: Secondary | ICD-10-CM

## 2021-12-20 MED ORDER — PROMETHAZINE-DM 6.25-15 MG/5ML PO SYRP: 5.0000 mL | ORAL_SOLUTION | Freq: Two times a day (BID) | ORAL | 0 refills | Status: DC | PRN

## 2021-12-20 MED ORDER — BENZONATATE 200 MG PO CAPS: 200.0000 mg | ORAL_CAPSULE | Freq: Three times a day (TID) | ORAL | 0 refills | Status: AC | PRN

## 2021-12-20 MED ORDER — CEFDINIR 300 MG PO CAPS: 300.0000 mg | ORAL_CAPSULE | Freq: Two times a day (BID) | ORAL | 0 refills | Status: AC

## 2021-12-20 NOTE — ED Provider Notes (Signed)
[name]    CSN: [id number] Arrival date & time: 12/20/21  1858      History   Chief Complaint Chief Complaint  Patient presents with   Cough    HPI Rebekah [name] is a 66 y.o. female.   HPI 66 year old female presents with cough for 1 week.  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   Family history of ovarian cancer    Family history of breast cancer    Family history of bladder cancer    Adjustment disorder with mixed anxiety and depressed mood 02/26/2020   Allergy to mold 01/22/2020   Stress due to family tension 11/09/2019   OSA (obstructive sleep apnea) 11/09/2019   Epigastric pain 01/28/2019   Gastroesophageal reflux disease with esophagitis 01/28/2019   Newly recognized heart murmur 01/28/2019   Bradycardia 01/28/2019   Hematuria 05/27/2018   Overweight (BMI 25.0-29.9) 05/27/2018   Rhinitis, allergic 06/11/2016   Class 1 obesity due to excess calories without serious comorbidity with body mass index (BMI) of 33.0 to 33.9 in adult 01/11/2016   Squamous cell carcinoma 09/21/2014   Chronic allergic rhinitis 05/30/2014   Elevated LDL cholesterol level 04/14/2014   Allergic rhinitis 04/14/2014   Seasonal allergies 04/14/2014   OAB (overactive bladder) 04/14/2014    Past Surgical History:  Procedure Laterality Date   BREAST BIOPSY     DILATION AND CURETTAGE OF UTERUS  10/31/2010    Hysteroscopy with dilation and curettage - Dr. Quincy Simmonds - [facility]   KNEE SURGERY Bilateral    REDUCTION MAMMAPLASTY Bilateral    REFRACTIVE SURGERY Bilateral     OB History     Gravida  3   Para  2   Term  2   Preterm      AB  1   Living  1      SAB      IAB      Ectopic      Multiple      Live Births               Home Medications    Prior to Admission medications   Medication Sig Start Date End Date Taking? Authorizing Provider  benzonatate (TESSALON) 200 MG capsule Take 1 capsule (200 mg total) by mouth 3 (three) times daily as needed for up to 7 days for cough. 12/20/21 12/27/21 Yes Eliezer Lofts, FNP  cefdinir (OMNICEF) 300 MG capsule Take 1 capsule (300 mg total) by mouth 2 (two) times daily for 7 days. 12/20/21 12/27/21 Yes Eliezer Lofts, FNP  promethazine-dextromethorphan (PROMETHAZINE-DM) 6.25-15 MG/5ML syrup Take 5 mLs by mouth 2 (two) times daily as needed for cough. 12/20/21  Yes Eliezer Lofts, FNP  Ascorbic Acid (VITAMIN C) 100 MG tablet Take 100 mg by mouth daily.    [provider]  azelastine (ASTELIN) 0.1 % nasal spray Place 2 sprays into both nostrils 2 (  two) times daily. 10/21/20   Donella Stade, PA-C  calcium carbonate (OS-CAL) 600 MG TABS tablet     [provider]  Cholecalciferol (D3-1000) 25 MCG (1000 UT) tablet     [provider]  Cyanocobalamin (B-12) 1000 MCG TABS     [provider]  fluticasone (FLONASE) 50 MCG/ACT nasal spray Place 2 sprays into both nostrils daily. 03/18/20   Breeback, Jade L, PA-C  levocetirizine (XYZAL) 5 MG tablet Take 1 tablet (5 mg total) by mouth every evening. 10/30/21   Breeback, Jade L, PA-C  lovastatin (MEVACOR) 40 MG tablet Take 1 tablet (40 mg total) by mouth at bedtime. 10/30/21   Breeback, Jade L, PA-C  Melatonin 10 MG TABS Take by mouth.    [provider]  Multiple Vitamins-Minerals (CENTRUM SILVER 50+WOMEN) TABS     [provider]   predniSONE (DELTASONE) 50 MG tablet One tab PO daily for 5 days. 10/30/21   Breeback, Jade L, PA-C  traZODone (DESYREL) 50 MG tablet Take 0.5-1 tablets (25-50 mg total) by mouth at bedtime. 10/30/21   Breeback, Royetta Car, PA-C  UNABLE TO FIND Med Name: Allergy Shots    [provider]    Family History Family History  Problem Relation Age of Onset   Hypertension Mother    Stroke Mother    Cancer Mother 76       ovarian ca, hx of TTP   Hyperlipidemia Mother    Ovarian cancer Mother    Hypertension Father    Cancer Father 74       bladder ca   Hyperlipidemia Father    Dementia Father        lewy body dementia   Anemia Sister    Mental illness Sister        mentally disabled--lives in [name] in [place]   Diabetes Brother    Asthma Brother        allergy induced   Ovarian cancer Maternal Grandmother    Breast cancer Cousin        dx 64s    Social History Social History   Tobacco Use   Smoking status: Never   Smokeless tobacco: Never  Vaping Use   Vaping Use: Never used  Substance Use Topics   Alcohol use: Yes    Alcohol/week: 1.0 standard drink    Types: 1 Glasses of wine per week   Drug use: Never     Allergies   Gramineae pollens, Molds & smuts, Tree extract, and Dust mite extract   Review of Systems Review of Systems  Respiratory:  Positive for cough.   All other systems reviewed and are negative.   Physical Exam Triage Vital Signs ED Triage Vitals  Enc Vitals [name]     BP 12/20/21 1906 (!) 169/71     Pulse Rate 12/20/21 1906 60     Resp 12/20/21 1906 17     Temp 12/20/21 1906 98 F (36.7 C)     Temp Source 12/20/21 1906 Oral     SpO2 12/20/21 1906 99 %     Weight --      Height --      Head Circumference --      Peak Flow --      Pain Score 12/20/21 1908 0     Pain Loc --      Pain Edu? --      Excl. in [place]? --    No data found.  Updated Vital Signs  BP (!) 169/71 (BP Location: Right Arm)    Pulse 60    Temp 98 F (36.7 C)  (Oral)    Resp 17    LMP 12/31/2009 (Approximate)    SpO2 99%       Physical Exam Vitals and nursing note reviewed.  Constitutional:      General: She is not in acute distress.    Appearance: Normal appearance. She is obese. She is not ill-appearing.  HENT:     Head: Normocephalic and atraumatic.     Right Ear: Tympanic membrane, ear canal and external ear normal.     Left Ear: Tympanic membrane, ear canal and external ear normal.     Mouth/Throat:     Mouth: Mucous membranes are moist.     Pharynx: Oropharynx is clear.  Eyes:     Extraocular Movements: Extraocular movements intact.     Conjunctiva/sclera: Conjunctivae normal.     Pupils: Pupils are equal, round, and reactive to light.  Cardiovascular:     Rate and Rhythm: Normal rate and regular rhythm.     Pulses: Normal pulses.     Heart sounds: Normal heart sounds.  Pulmonary:     Effort: Pulmonary effort is normal.     Breath sounds: Normal breath sounds. No wheezing, rhonchi or rales.     Comments: Frequent nonproductive cough noted on exam Musculoskeletal:     Cervical back: Normal range of motion and neck supple.  Skin:    General: Skin is warm and dry.  Neurological:     General: No focal deficit present.     Mental Status: She is alert and oriented to person, place, and time.     UC Treatments / Results  Labs (all labs ordered are listed, but only abnormal results are displayed) Labs Reviewed - No data to display  EKG   Radiology No results found.  Procedures Procedures (including critical care time)  Medications Ordered in UC Medications - No data to display  Initial Impression / Assessment and Plan / UC Course  I have reviewed the triage vital signs and the nursing notes.  Pertinent labs & imaging results that were available during my care of the patient were reviewed by me and considered in my medical decision making (see chart for details).     MDM: 1.  Cough-Rx'd Cefdinir, Tessalon Perles,  and Promethazine DM. Advised patient to take medication as directed with food to completion.  Advised patient may use Tessalon Perles daily or as needed for cough.  Advised patient to use Promethazine DM and early to late evening due to sedative of effects.  Advised not to use Tessalon Perles and Promethazine DM together.  Encouraged patient to increase daily water intake while taking these medications.  Patient discharged home, hemodynamically stable. Final Clinical Impressions(s) / UC Diagnoses   Final diagnoses:  Cough, unspecified type     Discharge Instructions      Advised patient to take medication as directed with food to completion.  Advised patient may use Tessalon Perles daily or as needed for cough.  Advised patient to use Promethazine DM and early to late evening due to sedative of effects.  Advised not to use Tessalon Perles and Promethazine DM together.  Encouraged patient to increase daily water intake while taking these medications.     ED Prescriptions     Medication Sig Dispense Auth. Provider   cefdinir (OMNICEF) 300 MG capsule Take 1 capsule (300 mg total) by mouth 2 (two) times  daily for 7 days. 14 capsule Eliezer Lofts, FNP   benzonatate (TESSALON) 200 MG capsule Take 1 capsule (200 mg total) by mouth 3 (three) times daily as needed for up to 7 days for cough. 40 capsule Eliezer Lofts, FNP   promethazine-dextromethorphan (PROMETHAZINE-DM) 6.25-15 MG/5ML syrup Take 5 mLs by mouth 2 (two) times daily as needed for cough. 118 mL Eliezer Lofts, FNP      PDMP not reviewed this encounter.   Eliezer Lofts, [place] 12/20/21 1936

## 2021-12-20 NOTE — ED Triage Notes (Signed)
Pt c/o cough x 1 week. Tessalon prn from when she had covid in June.

## 2021-12-20 NOTE — Discharge Instructions (Addendum)
Advised patient to take medication as directed with food to completion.  Advised patient may use Tessalon Perles daily or as needed for cough.  Advised patient to use Promethazine DM and early to late evening due to sedative of effects.  Advised not to use Tessalon Perles and Promethazine DM together.  Encouraged patient to increase daily water intake while taking these medications.

## 2021-12-27 ENCOUNTER — Telehealth: Payer: Medicare Other | Admitting: Physician Assistant

## 2021-12-28 DIAGNOSIS — J301 Allergic rhinitis due to pollen: Secondary | ICD-10-CM | POA: Diagnosis not present

## 2021-12-28 DIAGNOSIS — J3089 Other allergic rhinitis: Secondary | ICD-10-CM | POA: Diagnosis not present

## 2021-12-31 ENCOUNTER — Encounter: Payer: Self-pay | Admitting: Physician Assistant

## 2022-01-02 DIAGNOSIS — M5411 Radiculopathy, occipito-atlanto-axial region: Secondary | ICD-10-CM | POA: Diagnosis not present

## 2022-01-02 DIAGNOSIS — M9901 Segmental and somatic dysfunction of cervical region: Secondary | ICD-10-CM | POA: Diagnosis not present

## 2022-01-02 DIAGNOSIS — J3089 Other allergic rhinitis: Secondary | ICD-10-CM | POA: Diagnosis not present

## 2022-01-11 DIAGNOSIS — J301 Allergic rhinitis due to pollen: Secondary | ICD-10-CM | POA: Diagnosis not present

## 2022-01-11 DIAGNOSIS — J3089 Other allergic rhinitis: Secondary | ICD-10-CM | POA: Diagnosis not present

## 2022-01-12 DIAGNOSIS — G4733 Obstructive sleep apnea (adult) (pediatric): Secondary | ICD-10-CM | POA: Diagnosis not present

## 2022-01-23 DIAGNOSIS — M5411 Radiculopathy, occipito-atlanto-axial region: Secondary | ICD-10-CM | POA: Diagnosis not present

## 2022-01-23 DIAGNOSIS — M9901 Segmental and somatic dysfunction of cervical region: Secondary | ICD-10-CM | POA: Diagnosis not present

## 2022-01-24 DIAGNOSIS — G4733 Obstructive sleep apnea (adult) (pediatric): Secondary | ICD-10-CM | POA: Diagnosis not present

## 2022-01-25 ENCOUNTER — Encounter: Payer: Self-pay | Admitting: Physician Assistant

## 2022-02-01 DIAGNOSIS — J3089 Other allergic rhinitis: Secondary | ICD-10-CM | POA: Diagnosis not present

## 2022-02-01 DIAGNOSIS — J301 Allergic rhinitis due to pollen: Secondary | ICD-10-CM | POA: Diagnosis not present

## 2022-02-12 DIAGNOSIS — G4733 Obstructive sleep apnea (adult) (pediatric): Secondary | ICD-10-CM | POA: Diagnosis not present

## 2022-02-20 DIAGNOSIS — M9901 Segmental and somatic dysfunction of cervical region: Secondary | ICD-10-CM | POA: Diagnosis not present

## 2022-02-20 DIAGNOSIS — M5411 Radiculopathy, occipito-atlanto-axial region: Secondary | ICD-10-CM | POA: Diagnosis not present

## 2022-02-21 DIAGNOSIS — J3089 Other allergic rhinitis: Secondary | ICD-10-CM | POA: Diagnosis not present

## 2022-02-21 DIAGNOSIS — J301 Allergic rhinitis due to pollen: Secondary | ICD-10-CM | POA: Diagnosis not present

## 2022-03-12 DIAGNOSIS — G4733 Obstructive sleep apnea (adult) (pediatric): Secondary | ICD-10-CM | POA: Diagnosis not present

## 2022-03-13 DIAGNOSIS — M5411 Radiculopathy, occipito-atlanto-axial region: Secondary | ICD-10-CM | POA: Diagnosis not present

## 2022-03-13 DIAGNOSIS — M9901 Segmental and somatic dysfunction of cervical region: Secondary | ICD-10-CM | POA: Diagnosis not present

## 2022-03-15 DIAGNOSIS — J3089 Other allergic rhinitis: Secondary | ICD-10-CM | POA: Diagnosis not present

## 2022-03-15 DIAGNOSIS — J301 Allergic rhinitis due to pollen: Secondary | ICD-10-CM | POA: Diagnosis not present

## 2022-03-19 ENCOUNTER — Ambulatory Visit (INDEPENDENT_AMBULATORY_CARE_PROVIDER_SITE_OTHER): Payer: Medicare Other | Admitting: Physician Assistant

## 2022-03-19 ENCOUNTER — Encounter: Payer: Self-pay | Admitting: Physician Assistant

## 2022-03-19 ENCOUNTER — Other Ambulatory Visit: Payer: Self-pay

## 2022-03-19 VITALS — BP 147/85 | HR 61 | Ht 61.0 in | Wt 189.0 lb

## 2022-03-19 DIAGNOSIS — K30 Functional dyspepsia: Secondary | ICD-10-CM | POA: Diagnosis not present

## 2022-03-19 DIAGNOSIS — M5030 Other cervical disc degeneration, unspecified cervical region: Secondary | ICD-10-CM | POA: Diagnosis not present

## 2022-03-19 DIAGNOSIS — R142 Eructation: Secondary | ICD-10-CM

## 2022-03-19 DIAGNOSIS — K2100 Gastro-esophageal reflux disease with esophagitis, without bleeding: Secondary | ICD-10-CM | POA: Diagnosis not present

## 2022-03-19 DIAGNOSIS — M79671 Pain in right foot: Secondary | ICD-10-CM

## 2022-03-19 DIAGNOSIS — J309 Allergic rhinitis, unspecified: Secondary | ICD-10-CM

## 2022-03-19 DIAGNOSIS — M79672 Pain in left foot: Secondary | ICD-10-CM | POA: Diagnosis not present

## 2022-03-19 DIAGNOSIS — M5136 Other intervertebral disc degeneration, lumbar region: Secondary | ICD-10-CM

## 2022-03-19 MED ORDER — DICLOFENAC SODIUM 1 % EX GEL: 4.0000 g | Freq: Four times a day (QID) | CUTANEOUS | 1 refills | Status: DC

## 2022-03-19 MED ORDER — FAMOTIDINE 40 MG PO TABS: 40.0000 mg | ORAL_TABLET | Freq: Every day | ORAL | 1 refills | Status: DC

## 2022-03-19 NOTE — Progress Notes (Signed)
? ?Subjective:  ? ? Patient ID: Rebekah [name], female    DOB: Jul 25, 1955, 67 y.o.   MRN: [id number] ? ?HPI ?Pt is a 67 yo obese female with OSA, chronic allergic rhinitis, GERD, cervical and lumbar DDD who presents to the clinic for follow up.  ? ?Her allergies have been so much better on shots. She is doing really well.  ? ?She is having indigestion for the last few weeks. Worse at night when she lays down. She is burping more too. She did just start fish oil. No abdominal pain, melena, hematochezia, constipation, diarrhea. Not taking acid reducers. She is not taking NSAIds.  ? ?She does have upper/mid/low back pain and muscle tightness intermittently. She did PT at one point and helped a lot. She would like new order. No radiation of pain or strength changes.  ? ?She is going to [place] over the summer and will be walking a lot. For the last few weeks the balls of her feet burn and ache. No trauma. No swelling. Worse when she has been on her feet more.  ? ?.. ?Active Ambulatory Problems  ?  Diagnosis Date Noted  ? Elevated LDL cholesterol level 04/14/2014  ? Allergic rhinitis 04/14/2014  ? Seasonal allergies 04/14/2014  ? OAB (overactive bladder) 04/14/2014  ? Chronic allergic rhinitis 05/30/2014  ? Squamous cell carcinoma 09/21/2014  ? Class 1 obesity due to excess calories without serious comorbidity with body mass index (BMI) of 33.0 to 33.9 in adult 01/11/2016  ? Rhinitis, allergic 06/11/2016  ? Hematuria 05/27/2018  ? Overweight (BMI 25.0-29.9) 05/27/2018  ? Epigastric pain 01/28/2019  ? Gastroesophageal reflux disease with esophagitis without hemorrhage 01/28/2019  ? Newly recognized heart murmur 01/28/2019  ? Bradycardia 01/28/2019  ? Stress due to family tension 11/09/2019  ? OSA (obstructive sleep apnea) 11/09/2019  ? Allergy to mold 01/22/2020  ? Adjustment disorder with mixed anxiety and depressed mood 02/26/2020  ? Family history of ovarian cancer   ? Family history of breast cancer   ? Family history  of bladder cancer   ? Genetic testing 02/13/2021  ? 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 DDD (degenerative disc disease), cervical 05/08/2021  ? DDD (degenerative disc disease), lumbar 05/08/2021  ? Chronic bilateral low back pain without sciatica 11/06/2021  ? Primary insomnia 11/06/2021  ? Burping 03/19/2022  ? Bilateral foot pain 03/19/2022  ? Indigestion 03/19/2022  ? ?Resolved Ambulatory Problems  ?  Diagnosis Date Noted  ? Chest pain 01/28/2019  ? ?Past Medical History:  ?Diagnosis Date  ? Allergy   ? History of COVID-19 05/31/2021  ? Hyperlipidemia   ? Osteopenia 2020  ? Perimenopause   ? ? ? ? ?Review of Systems ?See HPI.  ?   ?Objective:  ? Physical Exam ?Vitals reviewed.  ?Constitutional:   ?   Appearance: Normal appearance. She is obese.  ?HENT:  ?   Head: Normocephalic.  ?Cardiovascular:  ?   Rate and Rhythm: Normal rate and regular rhythm.  ?   Pulses: Normal pulses.  ?   Heart sounds: Normal heart sounds.  ?Pulmonary:  ?   Effort: Pulmonary effort is normal.  ?   Breath sounds: Normal breath sounds.  ?Abdominal:  ?   General: Bowel sounds are normal. There is no distension.  ?   Palpations: Abdomen is soft. There is no mass.  ?   Tenderness: There is no abdominal tenderness. There is no right CVA tenderness, left CVA tenderness, guarding or rebound.  ?  Hernia: No hernia is present.  ?Musculoskeletal:  ?   Right lower leg: No edema.  ?   Left lower leg: No edema.  ?   Comments: No heel or fascia pain to palpation ?Pain over the ball of both feet ?No warmth or swelling ? ?No tenderness to palpation over cervical/thoracic/lumbar spine ?NROM at waist ?Upper and lower ext strength intact.   ?Neurological:  ?   General: No focal deficit present.  ?   Mental Status: She is alert and oriented to person, place, and time.  ?Psychiatric:     ?   Mood and Affect: Mood normal.  ? ? ? ? ? ?   ?Assessment & Plan:  ?..Shekira was seen today for follow-up. ? ?Diagnoses and all orders for this  visit: ? ?Indigestion ? ?Chronic allergic rhinitis ? ?Bilateral foot pain ?-     Ambulatory referral to Podiatry ? ?Burping ? ?DDD (degenerative disc disease), cervical ?-     Ambulatory referral to Physical Therapy ? ?DDD (degenerative disc disease), lumbar ?-     Ambulatory referral to Physical Therapy ? ?Gastroesophageal reflux disease with esophagitis without hemorrhage ? ?Other orders ?-     famotidine (PEPCID) 40 MG tablet; Take 1 tablet (40 mg total) by mouth at bedtime. ?-     diclofenac Sodium (VOLTAREN) 1 % GEL; Apply 4 g topically 4 (four) times daily. To affected joint. ? ? ?PT ordered for upper/mid/lower back pain. Pt has DDD. Ok to use muscle relaxer as needed.  ? ?Bilateral foot pain- sounds like some metatarsalgia. Use voltaren gel, good supportive shoes, icing, foot roller.  ?Referral to podiatry to consider orthotics.  ? ?? Indigestion from fish oil that she started. Stop for 2 weeks and see if resolves. Consider freezing fish oil or getting burpless. ?Pepcid at bedtime as needed. ?Follow up if no improvement.  ? ?BP not to goal. Recheck nurse visit in 1 month.  ? ? ? ?

## 2022-03-19 NOTE — Patient Instructions (Signed)
Will refer to podiatry for bilateral feet pain.  ?Start voltaren gel up to 4 times a day.  ?Start pepcid '40mg'$  at bedtime.  ?Will refer to PT.  ? ?

## 2022-03-20 ENCOUNTER — Encounter: Payer: Self-pay | Admitting: Physician Assistant

## 2022-03-21 ENCOUNTER — Telehealth: Payer: Self-pay | Admitting: Podiatry

## 2022-03-21 NOTE — Telephone Encounter (Signed)
Patient called and stated that she has an appointment on Friday with Dr. Blenda Mounts and she stated that her pcp prescribed her some gel to put on her foot, she wanted to know if she should use it before coming into her appointment. ? ?Please advise ?

## 2022-03-23 ENCOUNTER — Ambulatory Visit (INDEPENDENT_AMBULATORY_CARE_PROVIDER_SITE_OTHER): Payer: Medicare Other

## 2022-03-23 ENCOUNTER — Encounter: Payer: Self-pay | Admitting: Podiatry

## 2022-03-23 ENCOUNTER — Other Ambulatory Visit: Payer: Self-pay

## 2022-03-23 ENCOUNTER — Ambulatory Visit: Payer: Medicare Other | Admitting: Podiatry

## 2022-03-23 DIAGNOSIS — M7752 Other enthesopathy of left foot: Secondary | ICD-10-CM | POA: Diagnosis not present

## 2022-03-23 DIAGNOSIS — M79671 Pain in right foot: Secondary | ICD-10-CM | POA: Diagnosis not present

## 2022-03-23 DIAGNOSIS — M79672 Pain in left foot: Secondary | ICD-10-CM | POA: Diagnosis not present

## 2022-03-23 DIAGNOSIS — M7751 Other enthesopathy of right foot: Secondary | ICD-10-CM | POA: Diagnosis not present

## 2022-03-23 MED ORDER — MELOXICAM 15 MG PO TABS: 15.0000 mg | ORAL_TABLET | Freq: Every day | ORAL | 0 refills | Status: DC

## 2022-03-23 NOTE — Progress Notes (Signed)
?  Subjective:  ?Patient ID: Rebekah [name], female    DOB: [birth year],   MRN: [id number] ? ?Chief Complaint  ?Patient presents with  ? Foot Pain  ?  bilateral foot pain  ? ? ?67 y.o. female presents for concern of bilateral foot pain that has been going on for two months in the ball of her foot. Relates right is worse than left. Relates a burning sensation and they only thing that helps is sitting.   . Denies any other pedal complaints. Denies n/v/f/c.  ? ?Past Medical History:  ?Diagnosis Date  ? Allergy   ? --takes allergy injections  ? Family history of bladder cancer   ? Family history of breast cancer   ? Family history of ovarian cancer   ? History of COVID-19 05/31/2021  ? Hyperlipidemia   ? Osteopenia 2020  ? Perimenopause   ? ? ?Objective:  ?Physical Exam: ?Vascular: DP/PT pulses 2/4 bilateral. CFT <3 seconds. Normal hair growth on digits. No edema.  ?Skin. No lacerations or abrasions bilateral feet.  ?Musculoskeletal: MMT 5/5 bilateral lower extremities in DF, PF, Inversion and Eversion. Deceased ROM in DF of ankle joint. Tender to the plantar second MPJ bilateral R>L. Pain with DF of the joint. No pain in interspaces or in other MPJs  ?Neurological: Sensation intact to light touch.  ? ?Assessment:  ? ?1. Capsulitis of toe, right   ?2. Capsulitis of toe, left   ? ? ? ?Plan:  ?Patient was evaluated and treated and all questions answered. ?Discussed capsulitis and inflammation of joint and treatment options with patient.  ?Radiographs reviewed and discussed with patient. No acute fractures or dislocations.  ?Defer injection today.  ?Discussed stiff sole shoes and recommend use of carbon fiber foot plate.  ?Discussed padding and metatarsal pads provided.  ?Meloxicam provided.   ?Discussed if pain does not improve may consider PT and/or MRI for further surgical planning.  ?Patient to return in 6 weeks or sooner if concerns arise.  ? ? ?Lorenda Peck, DPM  ? ? ?

## 2022-04-02 ENCOUNTER — Telehealth: Payer: Self-pay | Admitting: Podiatry

## 2022-04-02 NOTE — Telephone Encounter (Signed)
Patient called and left a voicemail stating that she purchased some bionic shoes and she can tell its more arch support. She wanted to know if she still needs to wear the food pads that had been giving by Dr. Blenda Mounts. ? ? ?

## 2022-04-03 ENCOUNTER — Encounter: Payer: Self-pay | Admitting: Physician Assistant

## 2022-04-03 ENCOUNTER — Telehealth: Payer: Self-pay

## 2022-04-03 ENCOUNTER — Ambulatory Visit (INDEPENDENT_AMBULATORY_CARE_PROVIDER_SITE_OTHER): Payer: Medicare Other | Admitting: Physician Assistant

## 2022-04-03 VITALS — BP 135/53 | HR 65 | Ht 61.0 in | Wt 189.0 lb

## 2022-04-03 DIAGNOSIS — E66812 Obesity, class 2: Secondary | ICD-10-CM

## 2022-04-03 DIAGNOSIS — Z6835 Body mass index (BMI) 35.0-35.9, adult: Secondary | ICD-10-CM

## 2022-04-03 DIAGNOSIS — Z0000 Encounter for general adult medical examination without abnormal findings: Secondary | ICD-10-CM

## 2022-04-03 MED ORDER — WEGOVY 0.25 MG/0.5ML ~~LOC~~ SOAJ: 0.2500 mg | SUBCUTANEOUS | 0 refills | Status: DC

## 2022-04-03 NOTE — Patient Instructions (Addendum)
[place] once a week.  ? ?Health Maintenance After Age 67 ?After age 54, you are at a higher risk for certain long-term diseases and infections as well as injuries from falls. Falls are a major cause of broken bones and head injuries in people who are older than age 23. Getting regular preventive care can help to keep you healthy and well. Preventive care includes getting regular testing and making lifestyle changes as recommended by your health care provider. Talk with your health care provider about: ?Which screenings and tests you should have. A screening is a test that checks for a disease when you have no symptoms. ?A diet and exercise plan that is right for you. ?What should I know about screenings and tests to prevent falls? ?Screening and testing are the best ways to find a health problem early. Early diagnosis and treatment give you the best chance of managing medical conditions that are common after age 44. Certain conditions and lifestyle choices may make you more likely to have a fall. Your health care provider may recommend: ?Regular vision checks. Poor vision and conditions such as cataracts can make you more likely to have a fall. If you wear glasses, make sure to get your prescription updated if your vision changes. ?Medicine review. Work with your health care provider to regularly review all of the medicines you are taking, including over-the-counter medicines. Ask your health care provider about any side effects that may make you more likely to have a fall. Tell your health care provider if any medicines that you take make you feel dizzy or sleepy. ?Strength and balance checks. Your health care provider may recommend certain tests to check your strength and balance while standing, walking, or changing positions. ?Foot health exam. Foot pain and numbness, as well as not wearing proper footwear, can make you more likely to have a fall. ?Screenings, including: ?Osteoporosis screening. Osteoporosis is a  condition that causes the bones to get weaker and break more easily. ?Blood pressure screening. Blood pressure changes and medicines to control blood pressure can make you feel dizzy. ?Depression screening. You may be more likely to have a fall if you have a fear of falling, feel depressed, or feel unable to do activities that you used to do. ?Alcohol use screening. Using too much alcohol can affect your balance and may make you more likely to have a fall. ?Follow these instructions at home: ?Lifestyle ?Do not drink alcohol if: ?Your health care provider tells you not to drink. ?If you drink alcohol: ?Limit how much you have to: ?0-1 drink a day for women. ?0-2 drinks a day for men. ?Know how much alcohol is in your drink. In the [place] one drink equals one 12 oz bottle of beer (355 mL), one 5 oz glass of wine (148 mL), or one 1? oz glass of hard liquor (44 mL). ?Do not use any products that contain nicotine or tobacco. These products include cigarettes, chewing tobacco, and vaping devices, such as e-cigarettes. If you need help quitting, ask your health care provider. ?Activity ? ?Follow a regular exercise program to stay fit. This will help you maintain your balance. Ask your health care provider what types of exercise are appropriate for you. ?If you need a cane or walker, use it as recommended by your health care provider. ?Wear supportive shoes that have nonskid soles. ?Safety ? ?Remove any tripping hazards, such as rugs, cords, and clutter. ?Install safety equipment such as grab bars in bathrooms and safety rails  on stairs. ?Keep rooms and walkways well-lit. ?General instructions ?Talk with your health care provider about your risks for falling. Tell your health care provider if: ?You fall. Be sure to tell your health care provider about all falls, even ones that seem minor. ?You feel dizzy, tiredness (fatigue), or off-balance. ?Take over-the-counter and prescription medicines only as told by your health care  provider. These include supplements. ?Eat a healthy diet and maintain a healthy weight. A healthy diet includes low-fat dairy products, low-fat (lean) meats, and fiber from whole grains, beans, and lots of fruits and vegetables. ?Stay current with your vaccines. ?Schedule regular health, dental, and eye exams. ?Summary ?Having a healthy lifestyle and getting preventive care can help to protect your health and wellness after age 80. ?Screening and testing are the best way to find a health problem early and help you avoid having a fall. Early diagnosis and treatment give you the best chance for managing medical conditions that are more common for people who are older than age 53. ?Falls are a major cause of broken bones and head injuries in people who are older than age 83. Take precautions to prevent a fall at home. ?Work with your health care provider to learn what changes you can make to improve your health and wellness and to prevent falls. ?This information is not intended to replace advice given to you by your health care provider. Make sure you discuss any questions you have with your health care provider. ?Document Revised: 05/08/2021 Document Reviewed: 05/08/2021 ?Elsevier Patient Education ? [employer]. ? ?

## 2022-04-03 NOTE — Telephone Encounter (Addendum)
Initiated Prior authorization PTE:LMRAJH 0.'25MG'$ /0.5ML auto-injectors ?Via: Covermymeds ?Case/Key: [id number] ?Status: denied as of 04/03/22 ?Reason:The requested medication/product is excluded from Part D prescription coverage under ?Medicare rules. Please refer to your Evidence of Coverage (EOC) section that references Part ?D drug coverage in your pharmacy plan documents for more information ?Notified Pt via: Mychart ?

## 2022-04-03 NOTE — Progress Notes (Signed)
? ?Subjective:  ? ? Rebekah [name] is a 67 y.o. female who presents for a Welcome to Medicare exam.  ? ?Review of Systems  ?Negative for any significant findings.  ?  ? ?    ?Objective:  ?  ?Today's Vitals  ? 04/03/22 1311  ?BP: (!) 135/53  ?Pulse: 65  ?SpO2: 100%  ?Weight: 189 lb (85.7 kg)  ?Height: '5\' 1"'$  (1.549 m)  ?Body mass index is 35.71 kg/m?. ? ?Medications ?Outpatient Encounter Medications as of 04/03/2022  ?Medication Sig  ? Ascorbic Acid (VITAMIN C) 100 MG tablet Take 100 mg by mouth daily.  ? calcium carbonate (OS-CAL) 600 MG TABS tablet   ? Cholecalciferol (D3-1000) 25 MCG (1000 UT) tablet   ? Cyanocobalamin (B-12) 1000 MCG TABS   ? diclofenac Sodium (VOLTAREN) 1 % GEL Apply 4 g topically 4 (four) times daily. To affected joint.  ? levocetirizine (XYZAL) 5 MG tablet Take 1 tablet (5 mg total) by mouth every evening.  ? lovastatin (MEVACOR) 40 MG tablet Take 1 tablet (40 mg total) by mouth at bedtime.  ? meloxicam (MOBIC) 15 MG tablet Take 1 tablet (15 mg total) by mouth daily.  ? Multiple Vitamins-Minerals (CENTRUM SILVER 50+WOMEN) TABS   ? Semaglutide-Weight Management (WEGOVY) 0.25 MG/0.5ML SOAJ Inject 0.25 mg into the skin once a week.  ? traZODone (DESYREL) 50 MG tablet Take 0.5-1 tablets (25-50 mg total) by mouth at bedtime.  ? UNABLE TO FIND Med Name: Allergy Shots  ? [DISCONTINUED] famotidine (PEPCID) 40 MG tablet Take 1 tablet (40 mg total) by mouth at bedtime.  ? [DISCONTINUED] fluticasone (FLONASE) 50 MCG/ACT nasal spray Place 2 sprays into both nostrils daily.  ? [DISCONTINUED] Melatonin 10 MG TABS Take by mouth.  ? ?No facility-administered encounter medications on file as of 04/03/2022.  ?  ? ?History: ?Past Medical History:  ?Diagnosis Date  ? Allergy   ? --takes allergy injections  ? Family history of bladder cancer   ? Family history of breast cancer   ? Family history of ovarian cancer   ? History of COVID-19 05/31/2021  ? Hyperlipidemia   ? Osteopenia 2020  ? Perimenopause   ? ?Past  Surgical History:  ?Procedure Laterality Date  ? BREAST BIOPSY    ? DILATION AND CURETTAGE OF UTERUS  10/31/2010  ? Hysteroscopy with dilation and curettage - Dr. Quincy Simmonds - [facility]  ? KNEE SURGERY Bilateral   ? REDUCTION MAMMAPLASTY Bilateral   ? REFRACTIVE SURGERY Bilateral   ?  ?Family History  ?Problem Relation Age of Onset  ? Hypertension Mother   ? Stroke Mother   ? Cancer Mother 64  ?     ovarian ca, hx of TTP  ? Hyperlipidemia Mother   ? Ovarian cancer Mother   ? Hypertension Father   ? Cancer Father 84  ?     bladder ca  ? Hyperlipidemia Father   ? Dementia Father   ?     lewy body dementia  ? Anemia Sister   ? Mental illness Sister   ?     mentally disabled--lives in group home in [place]  ? Diabetes Brother   ? Asthma Brother   ?     allergy induced  ? Ovarian cancer Maternal Grandmother   ? Breast cancer Cousin   ?     dx 79s  ? ?Social History  ? ?Occupational History  ? Not on file  ?Tobacco Use  ? Smoking status: Never  ? Smokeless tobacco: Never  ?  Vaping Use  ? Vaping Use: Never used  ?Substance and Sexual Activity  ? Alcohol use: Yes  ?  Alcohol/week: 1.0 standard drink  ?  Types: 1 Glasses of wine per week  ? Drug use: Never  ? Sexual activity: Not Currently  ?  Partners: Male  ?  Birth control/protection: Post-menopausal  ? ? ?Tobacco Counseling ?Never smoker ? ? ?Immunizations and Health Maintenance ?Immunization History  ?Administered Date(s) Administered  ? Fluad Quad(high Dose 65+) 10/30/2021  ? Influenza,inj,Quad PF,6+ Mos 02/08/2015, 01/11/2016, 08/28/2016, 10/08/2018, 10/21/2020  ? Influenza,inj,quad, With Preservative 10/22/2017  ? Influenza-Unspecified 11/26/2012, 10/23/2017, 10/08/2018, 09/15/2019  ? Moderna Sars-Covid-2 Vaccination 02/26/2020, 03/25/2020, 12/05/2020  ? PNEUMOCOCCAL CONJUGATE-20 10/30/2021  ? Tdap 12/11/2012, 02/08/2015  ? Varicella Zoster Immune Globulin 06/04/2011  ? Zoster Recombinat (Shingrix) 10/28/2017, 01/28/2018  ? Zoster, Live 01/28/2018  ? ?Health Maintenance  Due  ?Topic Date Due  ? COVID-19 Vaccine (4 - Booster for Moderna series) 01/30/2021  ? ? ?Activities of Daily Living ? ?  04/03/2022  ?  1:00 PM  ?In your present state of health, do you have any difficulty performing the following activities:  ?Hearing? 0  ?Vision? 0  ?Difficulty concentrating or making decisions? 0  ?Walking or climbing stairs? 0  ?Dressing or bathing? 0  ?Doing errands, shopping? 0  ?Preparing Food and eating ? N  ?Using the Toilet? N  ?In the past six months, have you accidently leaked urine? N  ?Do you have problems with loss of bowel control? N  ?Managing your Medications? N  ?Managing your Finances? N  ?Housekeeping or managing your Housekeeping? N  ? ? ?Physical Exam   ? ?Advanced Directives: ?  ?   ?Assessment:  ?  ?This is a routine wellness examination for this patient . ? ?Vision/Hearing screen N/A ? ?Dietary issues and exercise activities discussed:  ?  ? ? Goals   ?None ?  ?Weight loss.  ?Depression Screen ? ?  04/03/2022  ?  1:50 PM 10/30/2021  ?  1:54 PM 10/21/2020  ?  2:47 PM 02/22/2020  ? 10:02 AM  ?PHQ 2/9 Scores  ?PHQ - 2 Score 0 0 0 6  ?PHQ- 9 Score 0   21  ?  ? ?Fall Risk ? ?  04/03/2022  ?  1:50 PM  ?Fall Risk   ?Falls in the past year? 0  ?Number falls in past yr: 0  ?Injury with Fall? 0  ?Risk for fall due to : No Fall Risks  ?Follow up Falls evaluation completed  ? ? ?Cognitive Function: ?  ?  ? ?  04/03/2022  ?  1:12 PM  ?6CIT Screen  ?What Year? 0 points  ?What month? 0 points  ?What time? 0 points  ?Count back from 20 0 points  ?Months in reverse 0 points  ?Repeat phrase 2 points  ?Total Score 2 points  ? ? ?Patient Care Team: ?Lavada Mesi as PCP - General (Family Medicine) ?Nunzio Cobbs, MD as Consulting Physician (Obstetrics and Gynecology) ? ?   ?Plan:  ? Colonoscopy scheduled next month ?Marland Kitchen.Discussed low carb diet with 1500 calories and 80g of protein.  ?Exercising at least 150 minutes a week.  ?My Fitness Pal could be a Microbiologist.  ?Discussed  wegovy. Start titration.  ?Follow up in 3 months.  ? ?I have personally reviewed and noted the following in the patient?s chart:  ? ?Medical and social history ?Use of alcohol, tobacco or illicit drugs  ?  Current medications and supplements ?Functional ability and status ?Nutritional status ?Physical activity ?Advanced directives ?List of other physicians ?Hospitalizations, surgeries, and ER visits in previous 12 months ?Vitals ?Screenings to include cognitive, depression, and falls ?Referrals and appointments ? ?In addition, I have reviewed and discussed with patient certain preventive protocols, quality metrics, and best practice recommendations. A written personalized care plan for preventive services as well as general preventive health recommendations were provided to patient. ? ?  ? ?Iran Planas, PA-C 04/08/2022 ? ? ? ? ?

## 2022-04-05 DIAGNOSIS — J301 Allergic rhinitis due to pollen: Secondary | ICD-10-CM | POA: Diagnosis not present

## 2022-04-05 DIAGNOSIS — J3089 Other allergic rhinitis: Secondary | ICD-10-CM | POA: Diagnosis not present

## 2022-04-08 ENCOUNTER — Encounter: Payer: Self-pay | Admitting: Physician Assistant

## 2022-04-09 DIAGNOSIS — M5451 Vertebrogenic low back pain: Secondary | ICD-10-CM | POA: Diagnosis not present

## 2022-04-09 DIAGNOSIS — M6281 Muscle weakness (generalized): Secondary | ICD-10-CM | POA: Diagnosis not present

## 2022-04-09 DIAGNOSIS — M542 Cervicalgia: Secondary | ICD-10-CM | POA: Diagnosis not present

## 2022-04-12 DIAGNOSIS — G4733 Obstructive sleep apnea (adult) (pediatric): Secondary | ICD-10-CM | POA: Diagnosis not present

## 2022-04-19 DIAGNOSIS — Z86010 Personal history of colonic polyps: Secondary | ICD-10-CM | POA: Diagnosis not present

## 2022-04-19 DIAGNOSIS — Z09 Encounter for follow-up examination after completed treatment for conditions other than malignant neoplasm: Secondary | ICD-10-CM | POA: Diagnosis not present

## 2022-04-19 DIAGNOSIS — Z1211 Encounter for screening for malignant neoplasm of colon: Secondary | ICD-10-CM | POA: Diagnosis not present

## 2022-04-19 DIAGNOSIS — K5730 Diverticulosis of large intestine without perforation or abscess without bleeding: Secondary | ICD-10-CM | POA: Diagnosis not present

## 2022-04-25 DIAGNOSIS — M542 Cervicalgia: Secondary | ICD-10-CM | POA: Diagnosis not present

## 2022-04-25 DIAGNOSIS — M5451 Vertebrogenic low back pain: Secondary | ICD-10-CM | POA: Diagnosis not present

## 2022-04-25 DIAGNOSIS — M6281 Muscle weakness (generalized): Secondary | ICD-10-CM | POA: Diagnosis not present

## 2022-04-26 ENCOUNTER — Telehealth: Payer: Self-pay | Admitting: Podiatry

## 2022-04-26 DIAGNOSIS — J3089 Other allergic rhinitis: Secondary | ICD-10-CM | POA: Diagnosis not present

## 2022-04-26 DIAGNOSIS — J301 Allergic rhinitis due to pollen: Secondary | ICD-10-CM | POA: Diagnosis not present

## 2022-04-26 NOTE — Telephone Encounter (Signed)
Left message that she could pick up the metatarsal pads in [place] office tomorrow between 730 and 2pm and they cost 3.00 each. ?

## 2022-04-26 NOTE — Telephone Encounter (Signed)
I believe we do have some here that we can sell here. She can get them at the next appointment or come in to the [place] office or [place] office to purchase  ?

## 2022-04-26 NOTE — Telephone Encounter (Signed)
Pt called asking if we had any of the metatarsal pads that she got from our office in [place]. She cannot find any like the ones we have and would like to get some. I was not sure if we sold product there or not. She does have an appt 5.5 ?

## 2022-05-01 DIAGNOSIS — M542 Cervicalgia: Secondary | ICD-10-CM | POA: Diagnosis not present

## 2022-05-01 DIAGNOSIS — M6281 Muscle weakness (generalized): Secondary | ICD-10-CM | POA: Diagnosis not present

## 2022-05-01 DIAGNOSIS — M5451 Vertebrogenic low back pain: Secondary | ICD-10-CM | POA: Diagnosis not present

## 2022-05-04 ENCOUNTER — Ambulatory Visit: Payer: Medicare Other | Admitting: Podiatry

## 2022-05-04 ENCOUNTER — Encounter: Payer: Self-pay | Admitting: Podiatry

## 2022-05-04 DIAGNOSIS — M5451 Vertebrogenic low back pain: Secondary | ICD-10-CM | POA: Diagnosis not present

## 2022-05-04 DIAGNOSIS — M542 Cervicalgia: Secondary | ICD-10-CM | POA: Diagnosis not present

## 2022-05-04 DIAGNOSIS — M7752 Other enthesopathy of left foot: Secondary | ICD-10-CM

## 2022-05-04 DIAGNOSIS — M7751 Other enthesopathy of right foot: Secondary | ICD-10-CM | POA: Diagnosis not present

## 2022-05-04 DIAGNOSIS — M6281 Muscle weakness (generalized): Secondary | ICD-10-CM | POA: Diagnosis not present

## 2022-05-04 NOTE — Progress Notes (Signed)
?  Subjective:  ?Patient ID: Rebekah [name], female    DOB: [birth year],   MRN: [id number] ? ?Chief Complaint  ?Patient presents with  ? Foot Pain  ?  Follow up capsulitis bilateral (R>L)   "They are better, but they still burn a lot"  ? ? ?67 y.o. female presents for concern of bilateral foot pain that has been going on for two months in the ball of her foot. Relates right is worse than left. Relates a burning sensation and they only thing that helps is sitting.   . Denies any other pedal complaints. Denies n/v/f/c.  ? ?Past Medical History:  ?Diagnosis Date  ? Allergy   ? --takes allergy injections  ? Family history of bladder cancer   ? Family history of breast cancer   ? Family history of ovarian cancer   ? History of COVID-19 05/31/2021  ? Hyperlipidemia   ? Osteopenia 2020  ? Perimenopause   ? ? ?Objective:  ?Physical Exam: ?Vascular: DP/PT pulses 2/4 bilateral. CFT <3 seconds. Normal hair growth on digits. No edema.  ?Skin. No lacerations or abrasions bilateral feet.  ?Musculoskeletal: MMT 5/5 bilateral lower extremities in DF, PF, Inversion and Eversion. Deceased ROM in DF of ankle joint. Minimally tender to the plantar second MPJ bilateral R>L. No pain with DF of the joint. No pain in interspaces or in other MPJs  ?Neurological: Sensation intact to light touch.  ? ?Assessment:  ? ?1. Capsulitis of toe, right   ?2. Capsulitis of toe, left   ? ? ? ?Plan:  ?Patient was evaluated and treated and all questions answered. ?Discussed capsulitis and inflammation of joint and treatment options with patient.  ?Radiographs reviewed and discussed with patient. No acute fractures or dislocations.  ?Defer injection today.  ?Discussed stiff sole shoes and recommend use of carbon fiber foot plate.  ?Continue meloxicam as needed.  ?Continue padding and additional padding dispensed. Marland Kitchen  ?Patient to return as needed ? ? ?Lorenda Peck, DPM  ? ? ?

## 2022-05-08 DIAGNOSIS — M5451 Vertebrogenic low back pain: Secondary | ICD-10-CM | POA: Diagnosis not present

## 2022-05-08 DIAGNOSIS — M542 Cervicalgia: Secondary | ICD-10-CM | POA: Diagnosis not present

## 2022-05-08 DIAGNOSIS — M6281 Muscle weakness (generalized): Secondary | ICD-10-CM | POA: Diagnosis not present

## 2022-05-10 DIAGNOSIS — M5451 Vertebrogenic low back pain: Secondary | ICD-10-CM | POA: Diagnosis not present

## 2022-05-10 DIAGNOSIS — M542 Cervicalgia: Secondary | ICD-10-CM | POA: Diagnosis not present

## 2022-05-10 DIAGNOSIS — M6281 Muscle weakness (generalized): Secondary | ICD-10-CM | POA: Diagnosis not present

## 2022-05-12 DIAGNOSIS — G4733 Obstructive sleep apnea (adult) (pediatric): Secondary | ICD-10-CM | POA: Diagnosis not present

## 2022-05-15 DIAGNOSIS — M6281 Muscle weakness (generalized): Secondary | ICD-10-CM | POA: Diagnosis not present

## 2022-05-15 DIAGNOSIS — M542 Cervicalgia: Secondary | ICD-10-CM | POA: Diagnosis not present

## 2022-05-15 DIAGNOSIS — M5451 Vertebrogenic low back pain: Secondary | ICD-10-CM | POA: Diagnosis not present

## 2022-05-17 DIAGNOSIS — M6281 Muscle weakness (generalized): Secondary | ICD-10-CM | POA: Diagnosis not present

## 2022-05-17 DIAGNOSIS — J3089 Other allergic rhinitis: Secondary | ICD-10-CM | POA: Diagnosis not present

## 2022-05-17 DIAGNOSIS — M542 Cervicalgia: Secondary | ICD-10-CM | POA: Diagnosis not present

## 2022-05-17 DIAGNOSIS — M5451 Vertebrogenic low back pain: Secondary | ICD-10-CM | POA: Diagnosis not present

## 2022-05-17 DIAGNOSIS — J301 Allergic rhinitis due to pollen: Secondary | ICD-10-CM | POA: Diagnosis not present

## 2022-05-21 DIAGNOSIS — J301 Allergic rhinitis due to pollen: Secondary | ICD-10-CM | POA: Diagnosis not present

## 2022-05-21 DIAGNOSIS — J3089 Other allergic rhinitis: Secondary | ICD-10-CM | POA: Diagnosis not present

## 2022-05-23 DIAGNOSIS — M5451 Vertebrogenic low back pain: Secondary | ICD-10-CM | POA: Diagnosis not present

## 2022-05-23 DIAGNOSIS — M542 Cervicalgia: Secondary | ICD-10-CM | POA: Diagnosis not present

## 2022-05-23 DIAGNOSIS — M6281 Muscle weakness (generalized): Secondary | ICD-10-CM | POA: Diagnosis not present

## 2022-06-05 DIAGNOSIS — M6281 Muscle weakness (generalized): Secondary | ICD-10-CM | POA: Diagnosis not present

## 2022-06-05 DIAGNOSIS — M5451 Vertebrogenic low back pain: Secondary | ICD-10-CM | POA: Diagnosis not present

## 2022-06-05 DIAGNOSIS — M542 Cervicalgia: Secondary | ICD-10-CM | POA: Diagnosis not present

## 2022-06-07 DIAGNOSIS — J301 Allergic rhinitis due to pollen: Secondary | ICD-10-CM | POA: Diagnosis not present

## 2022-06-07 DIAGNOSIS — J3089 Other allergic rhinitis: Secondary | ICD-10-CM | POA: Diagnosis not present

## 2022-06-12 ENCOUNTER — Encounter: Payer: Self-pay | Admitting: Medical-Surgical

## 2022-06-12 ENCOUNTER — Ambulatory Visit (INDEPENDENT_AMBULATORY_CARE_PROVIDER_SITE_OTHER): Payer: Medicare Other | Admitting: Medical-Surgical

## 2022-06-12 VITALS — BP 125/78 | HR 55 | Resp 20 | Ht 61.0 in | Wt 178.0 lb

## 2022-06-12 DIAGNOSIS — R6889 Other general symptoms and signs: Secondary | ICD-10-CM | POA: Diagnosis not present

## 2022-06-12 DIAGNOSIS — G4733 Obstructive sleep apnea (adult) (pediatric): Secondary | ICD-10-CM | POA: Diagnosis not present

## 2022-06-12 NOTE — Progress Notes (Signed)
Established Patient Office Visit  Subjective   Patient ID: Rebekah [name], female   DOB: [birth year] Age: 67 y.o. MRN: [id number]   Chief Complaint  Patient presents with   Ear Pain    HPI Very pleasant 67 year old female presenting today with concern for a possible insect in her left ear.  She has had a sensation of something moving in her ear for at least 2 weeks.  She is not having any strange sounds but does feel a tickle toward the bottom of her ear canal as if there is something in there.  She admits to using tweezers and Q-tips as well as a Bobby pin trying to see if there was a bug that was able to be removed.  She has not been able to remove anything and did not feel anything in her ear canal while she was using those objects.  No ear pain or alterations in hearing.  She does spend a lot of time outside and has a history of having a bug crawl in her ear so admits that she is a little more sensitive to these things.  During the appointment, she had several instances of feeling as if something was moving in her ear.  Review of Systems  Constitutional:  Negative for chills, fever and malaise/fatigue.  HENT:  Negative for congestion, ear discharge, ear pain, hearing loss, sinus pain and tinnitus.   Skin:  Negative for itching.  Neurological:  Negative for dizziness.      Objective:    Vitals:   06/12/22 1045  BP: 125/78  Pulse: (!) 55  Resp: 20  Height: '5\' 1"'$  (1.549 m)  Weight: 178 lb (80.7 kg)  SpO2: 97%  BMI (Calculated): 33.65     Physical Exam Vitals and nursing note reviewed.  Constitutional:      General: She is not in acute distress.    Appearance: Normal appearance. She is not ill-appearing.  HENT:     Head: Normocephalic and atraumatic.     Right Ear: Tympanic membrane, ear canal and external ear normal. There is no impacted cerumen.     Left Ear: Tympanic membrane, ear canal and external ear normal. There is no impacted cerumen.  Cardiovascular:     Rate  and Rhythm: Normal rate and regular rhythm.  Pulmonary:     Effort: Pulmonary effort is normal. No respiratory distress.  Skin:    General: Skin is warm and dry.  Neurological:     Mental Status: She is alert and oriented to person, place, and time.  Psychiatric:        Mood and Affect: Mood normal.        Behavior: Behavior normal.        Thought Content: Thought content normal.        Judgment: Judgment normal.   No results found for this or any previous visit (from the past 24 hour(s)).     The 10-year ASCVD risk score (Arnett DK, et al., 2019) is: 5.7%   Values used to calculate the score:     Age: 59 years     Sex: Female     Is Non-Hispanic African American: No     Diabetic: No     Tobacco smoker: No     Systolic Blood Pressure: 782 mmHg     Is BP treated: No     HDL Cholesterol: 59 mg/dL     Total Cholesterol: 194 mg/dL   Assessment & Plan:   1. Ear  symptom On exam, bilateral ear canals and TMs patent, normal, no erythema or effusion noted.  On the left ear that was affected, there was no foreign body or insect on visualization.  Several small hairs noted but none that would explain symptoms.  Recommend making sure to take a daily antihistamine and avoid using anything in the ears.  Possible dry skin but no significant itching.  Due to unclear etiology, discussed case with Dr. Madilyn Fireman who also visualized both the ears with no abnormal findings.  Patient reassured that there is no insect or other foreign body in the ear that would be causing her symptoms.  If symptoms do persist, return for further evaluation.  Return if symptoms worsen or fail to improve.  ___________________________________________ Clearnce Sorrel, DNP, APRN, FNP-BC [facility] [place]

## 2022-06-13 DIAGNOSIS — M542 Cervicalgia: Secondary | ICD-10-CM | POA: Diagnosis not present

## 2022-06-13 DIAGNOSIS — M5451 Vertebrogenic low back pain: Secondary | ICD-10-CM | POA: Diagnosis not present

## 2022-06-13 DIAGNOSIS — M6281 Muscle weakness (generalized): Secondary | ICD-10-CM | POA: Diagnosis not present

## 2022-06-20 ENCOUNTER — Encounter: Payer: Self-pay | Admitting: Physician Assistant

## 2022-06-20 DIAGNOSIS — M542 Cervicalgia: Secondary | ICD-10-CM | POA: Diagnosis not present

## 2022-06-20 DIAGNOSIS — M6281 Muscle weakness (generalized): Secondary | ICD-10-CM | POA: Diagnosis not present

## 2022-06-20 DIAGNOSIS — M5136 Other intervertebral disc degeneration, lumbar region: Secondary | ICD-10-CM

## 2022-06-20 DIAGNOSIS — M5451 Vertebrogenic low back pain: Secondary | ICD-10-CM | POA: Diagnosis not present

## 2022-06-20 DIAGNOSIS — M5030 Other cervical disc degeneration, unspecified cervical region: Secondary | ICD-10-CM

## 2022-06-28 DIAGNOSIS — J3089 Other allergic rhinitis: Secondary | ICD-10-CM | POA: Diagnosis not present

## 2022-06-28 DIAGNOSIS — M542 Cervicalgia: Secondary | ICD-10-CM | POA: Diagnosis not present

## 2022-06-28 DIAGNOSIS — M5451 Vertebrogenic low back pain: Secondary | ICD-10-CM | POA: Diagnosis not present

## 2022-06-28 DIAGNOSIS — J301 Allergic rhinitis due to pollen: Secondary | ICD-10-CM | POA: Diagnosis not present

## 2022-06-28 DIAGNOSIS — M6281 Muscle weakness (generalized): Secondary | ICD-10-CM | POA: Diagnosis not present

## 2022-07-10 DIAGNOSIS — M6281 Muscle weakness (generalized): Secondary | ICD-10-CM | POA: Diagnosis not present

## 2022-07-10 DIAGNOSIS — M5451 Vertebrogenic low back pain: Secondary | ICD-10-CM | POA: Diagnosis not present

## 2022-07-10 DIAGNOSIS — M542 Cervicalgia: Secondary | ICD-10-CM | POA: Diagnosis not present

## 2022-07-11 ENCOUNTER — Ambulatory Visit (INDEPENDENT_AMBULATORY_CARE_PROVIDER_SITE_OTHER): Payer: Medicare Other | Admitting: Physician Assistant

## 2022-07-11 ENCOUNTER — Encounter: Payer: Self-pay | Admitting: Physician Assistant

## 2022-07-11 VITALS — BP 134/72 | HR 57 | Ht 61.0 in | Wt 170.0 lb

## 2022-07-11 DIAGNOSIS — E7800 Pure hypercholesterolemia, unspecified: Secondary | ICD-10-CM | POA: Diagnosis not present

## 2022-07-11 DIAGNOSIS — F5101 Primary insomnia: Secondary | ICD-10-CM | POA: Diagnosis not present

## 2022-07-11 DIAGNOSIS — Z6832 Body mass index (BMI) 32.0-32.9, adult: Secondary | ICD-10-CM

## 2022-07-11 DIAGNOSIS — E6609 Other obesity due to excess calories: Secondary | ICD-10-CM

## 2022-07-11 DIAGNOSIS — J309 Allergic rhinitis, unspecified: Secondary | ICD-10-CM

## 2022-07-11 MED ORDER — TRAZODONE HCL 50 MG PO TABS: 25.0000 mg | ORAL_TABLET | Freq: Every day | ORAL | 3 refills | Status: DC

## 2022-07-11 MED ORDER — LOVASTATIN 40 MG PO TABS: 40.0000 mg | ORAL_TABLET | Freq: Every day | ORAL | 3 refills | Status: DC

## 2022-07-11 MED ORDER — LEVOCETIRIZINE DIHYDROCHLORIDE 5 MG PO TABS: 5.0000 mg | ORAL_TABLET | Freq: Every evening | ORAL | 3 refills | Status: DC

## 2022-07-11 NOTE — Progress Notes (Signed)
Established Patient Office Visit  Subjective   Patient ID: Rebekah [name], female    DOB: [birth year]  Age: 67 y.o. MRN: [id number]  Chief Complaint  Patient presents with   Follow-up    HPI Pt is a 67 yo obese female with OSA, chronic allergies, elevated LDL who presents to the clinic for follow up.   Pt does need refills on allergy medications. She is getting shots with allergies. They have been much better on this regimen.   She is doing the next 56 days and lost 20lbs. She feels great. She has more energy and mood better. She is walking and very active.   Trazodone for insomnia doing well. No concerns.   .. Active Ambulatory Problems    Diagnosis Date Noted   Elevated LDL cholesterol level 04/14/2014   Allergic rhinitis 04/14/2014   Seasonal allergies 04/14/2014   OAB (overactive bladder) 04/14/2014   Chronic allergic rhinitis 05/30/2014   Squamous cell carcinoma 09/21/2014   Class 1 obesity due to excess calories without serious comorbidity with body mass index (BMI) of 32.0 to 32.9 in adult 01/11/2016   Rhinitis, allergic 06/11/2016   Hematuria 05/27/2018   Overweight (BMI 25.0-29.9) 05/27/2018   Epigastric pain 01/28/2019   Gastroesophageal reflux disease with esophagitis without hemorrhage 01/28/2019   Newly recognized heart murmur 01/28/2019   Bradycardia 01/28/2019   Stress due to family tension 11/09/2019   OSA (obstructive sleep apnea) 11/09/2019   Allergy to mold 01/22/2020   Adjustment disorder with mixed anxiety and depressed mood 02/26/2020   Family history of ovarian cancer    Family history of breast cancer    Family history of bladder cancer    Genetic testing 02/13/2021   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     Objective:     BP 134/72   Pulse (!) 57   Ht '5\' 1"'$  (1.549 m)   Wt 170 lb (77.1 kg)   LMP 12/31/2009 (Approximate)   SpO2 97%   BMI 32.12 kg/m  BP Readings from Last 3 Encounters:  07/11/22 134/72  06/12/22 125/78  04/03/22 (!) 135/53   Wt Readings from Last 3 Encounters:  07/11/22 170 lb (77.1 kg)  06/12/22 178 lb (80.7 kg)  04/03/22 189 lb (85.7 kg)      Physical Exam Vitals reviewed.  Constitutional:      Appearance: Normal appearance. She is obese.  HENT:     Head: Normocephalic.  Neck:     Vascular: No carotid bruit.  Cardiovascular:     Rate and Rhythm: Normal rate and regular rhythm.     Pulses: Normal pulses.     Heart sounds: Normal heart sounds.  Pulmonary:     Effort: Pulmonary effort is normal.     Breath sounds: Normal breath sounds.  Musculoskeletal:     Right lower leg: No edema.     Left lower  leg: No edema.  Lymphadenopathy:     Cervical: No cervical adenopathy.  Neurological:     General: No focal deficit present.     Mental Status: She is oriented to person, place, and time.  Psychiatric:        Mood and Affect: Mood normal.          Assessment & Plan:  Marland KitchenMarland KitchenShellee was seen today for follow-up.  Diagnoses and all orders for this visit:  Elevated LDL cholesterol level -     lovastatin (MEVACOR) 40 MG tablet; Take 1 tablet (40 mg total) by mouth at bedtime.  Primary insomnia -     traZODone (DESYREL) 50 MG tablet; Take 0.5-1 tablets (25-50 mg total) by mouth at bedtime.  Chronic allergic rhinitis -     levocetirizine (XYZAL) 5 MG tablet; Take 1 tablet (5 mg total) by mouth every evening.  Class 1  obesity due to excess calories without serious comorbidity with body mass index (BMI) of 32.0 to 32.9 in adult   Will get labs at next visit Continue diet and exercise  Goal BMI is 27 BP looks good Allergy medications refilled Trazodone for insomnia    Return in about 6 months (around 01/11/2023).    Iran Planas, PA-C

## 2022-07-13 DIAGNOSIS — G4733 Obstructive sleep apnea (adult) (pediatric): Secondary | ICD-10-CM | POA: Diagnosis not present

## 2022-07-17 DIAGNOSIS — M6281 Muscle weakness (generalized): Secondary | ICD-10-CM | POA: Diagnosis not present

## 2022-07-17 DIAGNOSIS — M542 Cervicalgia: Secondary | ICD-10-CM | POA: Diagnosis not present

## 2022-07-17 DIAGNOSIS — M5451 Vertebrogenic low back pain: Secondary | ICD-10-CM | POA: Diagnosis not present

## 2022-07-19 DIAGNOSIS — J3089 Other allergic rhinitis: Secondary | ICD-10-CM | POA: Diagnosis not present

## 2022-07-19 DIAGNOSIS — J301 Allergic rhinitis due to pollen: Secondary | ICD-10-CM | POA: Diagnosis not present

## 2022-07-24 DIAGNOSIS — M6281 Muscle weakness (generalized): Secondary | ICD-10-CM | POA: Diagnosis not present

## 2022-07-24 DIAGNOSIS — M542 Cervicalgia: Secondary | ICD-10-CM | POA: Diagnosis not present

## 2022-07-24 DIAGNOSIS — M5451 Vertebrogenic low back pain: Secondary | ICD-10-CM | POA: Diagnosis not present

## 2022-08-09 DIAGNOSIS — J3089 Other allergic rhinitis: Secondary | ICD-10-CM | POA: Diagnosis not present

## 2022-08-09 DIAGNOSIS — J301 Allergic rhinitis due to pollen: Secondary | ICD-10-CM | POA: Diagnosis not present

## 2022-08-14 DIAGNOSIS — M6281 Muscle weakness (generalized): Secondary | ICD-10-CM | POA: Diagnosis not present

## 2022-08-14 DIAGNOSIS — M542 Cervicalgia: Secondary | ICD-10-CM | POA: Diagnosis not present

## 2022-08-14 DIAGNOSIS — M5451 Vertebrogenic low back pain: Secondary | ICD-10-CM | POA: Diagnosis not present

## 2022-08-20 DIAGNOSIS — M5451 Vertebrogenic low back pain: Secondary | ICD-10-CM | POA: Diagnosis not present

## 2022-08-20 DIAGNOSIS — M6281 Muscle weakness (generalized): Secondary | ICD-10-CM | POA: Diagnosis not present

## 2022-08-20 DIAGNOSIS — M542 Cervicalgia: Secondary | ICD-10-CM | POA: Diagnosis not present

## 2022-08-30 DIAGNOSIS — J301 Allergic rhinitis due to pollen: Secondary | ICD-10-CM | POA: Diagnosis not present

## 2022-08-30 DIAGNOSIS — J3089 Other allergic rhinitis: Secondary | ICD-10-CM | POA: Diagnosis not present

## 2022-09-17 DIAGNOSIS — Z8616 Personal history of COVID-19: Secondary | ICD-10-CM | POA: Diagnosis not present

## 2022-09-17 DIAGNOSIS — R0981 Nasal congestion: Secondary | ICD-10-CM | POA: Diagnosis not present

## 2022-09-17 DIAGNOSIS — R051 Acute cough: Secondary | ICD-10-CM | POA: Diagnosis not present

## 2022-09-17 DIAGNOSIS — R509 Fever, unspecified: Secondary | ICD-10-CM | POA: Diagnosis not present

## 2022-09-17 DIAGNOSIS — J029 Acute pharyngitis, unspecified: Secondary | ICD-10-CM | POA: Diagnosis not present

## 2022-09-17 DIAGNOSIS — R5383 Other fatigue: Secondary | ICD-10-CM | POA: Diagnosis not present

## 2022-09-17 DIAGNOSIS — Z20822 Contact with and (suspected) exposure to covid-19: Secondary | ICD-10-CM | POA: Diagnosis not present

## 2022-09-25 ENCOUNTER — Other Ambulatory Visit: Payer: Self-pay | Admitting: Physician Assistant

## 2022-09-25 DIAGNOSIS — Z1231 Encounter for screening mammogram for malignant neoplasm of breast: Secondary | ICD-10-CM

## 2022-09-27 DIAGNOSIS — G4733 Obstructive sleep apnea (adult) (pediatric): Secondary | ICD-10-CM | POA: Diagnosis not present

## 2022-10-03 DIAGNOSIS — M5451 Vertebrogenic low back pain: Secondary | ICD-10-CM | POA: Diagnosis not present

## 2022-10-03 DIAGNOSIS — M542 Cervicalgia: Secondary | ICD-10-CM | POA: Diagnosis not present

## 2022-10-03 DIAGNOSIS — M6281 Muscle weakness (generalized): Secondary | ICD-10-CM | POA: Diagnosis not present

## 2022-10-03 DIAGNOSIS — J3089 Other allergic rhinitis: Secondary | ICD-10-CM | POA: Diagnosis not present

## 2022-10-03 DIAGNOSIS — J301 Allergic rhinitis due to pollen: Secondary | ICD-10-CM | POA: Diagnosis not present

## 2022-10-10 DIAGNOSIS — M542 Cervicalgia: Secondary | ICD-10-CM | POA: Diagnosis not present

## 2022-10-10 DIAGNOSIS — M5451 Vertebrogenic low back pain: Secondary | ICD-10-CM | POA: Diagnosis not present

## 2022-10-10 DIAGNOSIS — M6281 Muscle weakness (generalized): Secondary | ICD-10-CM | POA: Diagnosis not present

## 2022-10-24 DIAGNOSIS — J301 Allergic rhinitis due to pollen: Secondary | ICD-10-CM | POA: Diagnosis not present

## 2022-10-24 DIAGNOSIS — J3089 Other allergic rhinitis: Secondary | ICD-10-CM | POA: Diagnosis not present

## 2022-10-30 ENCOUNTER — Encounter: Payer: Self-pay | Admitting: Physician Assistant

## 2022-10-30 DIAGNOSIS — G4733 Obstructive sleep apnea (adult) (pediatric): Secondary | ICD-10-CM | POA: Diagnosis not present

## 2022-11-06 DIAGNOSIS — M6281 Muscle weakness (generalized): Secondary | ICD-10-CM | POA: Diagnosis not present

## 2022-11-06 DIAGNOSIS — M5451 Vertebrogenic low back pain: Secondary | ICD-10-CM | POA: Diagnosis not present

## 2022-11-06 DIAGNOSIS — M542 Cervicalgia: Secondary | ICD-10-CM | POA: Diagnosis not present

## 2022-11-13 DIAGNOSIS — M5451 Vertebrogenic low back pain: Secondary | ICD-10-CM | POA: Diagnosis not present

## 2022-11-13 DIAGNOSIS — M542 Cervicalgia: Secondary | ICD-10-CM | POA: Diagnosis not present

## 2022-11-13 DIAGNOSIS — M6281 Muscle weakness (generalized): Secondary | ICD-10-CM | POA: Diagnosis not present

## 2022-11-14 DIAGNOSIS — G4700 Insomnia, unspecified: Secondary | ICD-10-CM | POA: Diagnosis not present

## 2022-11-14 DIAGNOSIS — G4733 Obstructive sleep apnea (adult) (pediatric): Secondary | ICD-10-CM | POA: Diagnosis not present

## 2022-11-15 DIAGNOSIS — J301 Allergic rhinitis due to pollen: Secondary | ICD-10-CM | POA: Diagnosis not present

## 2022-11-15 DIAGNOSIS — J3089 Other allergic rhinitis: Secondary | ICD-10-CM | POA: Diagnosis not present

## 2022-11-29 ENCOUNTER — Ambulatory Visit (INDEPENDENT_AMBULATORY_CARE_PROVIDER_SITE_OTHER): Payer: Medicare Other

## 2022-11-29 DIAGNOSIS — M542 Cervicalgia: Secondary | ICD-10-CM | POA: Diagnosis not present

## 2022-11-29 DIAGNOSIS — M5451 Vertebrogenic low back pain: Secondary | ICD-10-CM | POA: Diagnosis not present

## 2022-11-29 DIAGNOSIS — M6281 Muscle weakness (generalized): Secondary | ICD-10-CM | POA: Diagnosis not present

## 2022-11-29 DIAGNOSIS — Z1231 Encounter for screening mammogram for malignant neoplasm of breast: Secondary | ICD-10-CM | POA: Diagnosis not present

## 2022-12-06 DIAGNOSIS — M5451 Vertebrogenic low back pain: Secondary | ICD-10-CM | POA: Diagnosis not present

## 2022-12-06 DIAGNOSIS — J3089 Other allergic rhinitis: Secondary | ICD-10-CM | POA: Diagnosis not present

## 2022-12-06 DIAGNOSIS — M542 Cervicalgia: Secondary | ICD-10-CM | POA: Diagnosis not present

## 2022-12-06 DIAGNOSIS — M6281 Muscle weakness (generalized): Secondary | ICD-10-CM | POA: Diagnosis not present

## 2022-12-06 DIAGNOSIS — J301 Allergic rhinitis due to pollen: Secondary | ICD-10-CM | POA: Diagnosis not present

## 2022-12-20 DIAGNOSIS — M542 Cervicalgia: Secondary | ICD-10-CM | POA: Diagnosis not present

## 2022-12-20 DIAGNOSIS — J301 Allergic rhinitis due to pollen: Secondary | ICD-10-CM | POA: Diagnosis not present

## 2022-12-20 DIAGNOSIS — M6281 Muscle weakness (generalized): Secondary | ICD-10-CM | POA: Diagnosis not present

## 2022-12-20 DIAGNOSIS — J3089 Other allergic rhinitis: Secondary | ICD-10-CM | POA: Diagnosis not present

## 2022-12-20 DIAGNOSIS — M5451 Vertebrogenic low back pain: Secondary | ICD-10-CM | POA: Diagnosis not present

## 2023-01-02 DIAGNOSIS — M6281 Muscle weakness (generalized): Secondary | ICD-10-CM | POA: Diagnosis not present

## 2023-01-02 DIAGNOSIS — M5451 Vertebrogenic low back pain: Secondary | ICD-10-CM | POA: Diagnosis not present

## 2023-01-02 DIAGNOSIS — M542 Cervicalgia: Secondary | ICD-10-CM | POA: Diagnosis not present

## 2023-01-08 DIAGNOSIS — J3089 Other allergic rhinitis: Secondary | ICD-10-CM | POA: Diagnosis not present

## 2023-01-10 DIAGNOSIS — J3089 Other allergic rhinitis: Secondary | ICD-10-CM | POA: Diagnosis not present

## 2023-01-10 DIAGNOSIS — J301 Allergic rhinitis due to pollen: Secondary | ICD-10-CM | POA: Diagnosis not present

## 2023-01-14 ENCOUNTER — Other Ambulatory Visit (HOSPITAL_COMMUNITY)
Admission: RE | Admit: 2023-01-14 | Discharge: 2023-01-14 | Disposition: A | Discharge status: Home / Self Care | Payer: Medicare Other | Source: Ambulatory Visit | Attending: Physician Assistant | Admitting: Physician Assistant

## 2023-01-14 ENCOUNTER — Ambulatory Visit (INDEPENDENT_AMBULATORY_CARE_PROVIDER_SITE_OTHER): Payer: Medicare Other | Admitting: Physician Assistant

## 2023-01-14 ENCOUNTER — Encounter: Payer: Self-pay | Admitting: Physician Assistant

## 2023-01-14 VITALS — BP 135/62 | HR 62 | Ht 61.0 in | Wt 161.0 lb

## 2023-01-14 DIAGNOSIS — Z1329 Encounter for screening for other suspected endocrine disorder: Secondary | ICD-10-CM | POA: Diagnosis not present

## 2023-01-14 DIAGNOSIS — Q8789 Other specified congenital malformation syndromes, not elsewhere classified: Secondary | ICD-10-CM

## 2023-01-14 DIAGNOSIS — R8289 Other abnormal findings on cytological and histological examination of urine: Secondary | ICD-10-CM | POA: Diagnosis not present

## 2023-01-14 DIAGNOSIS — B49 Unspecified mycosis: Secondary | ICD-10-CM | POA: Diagnosis not present

## 2023-01-14 DIAGNOSIS — Z0000 Encounter for general adult medical examination without abnormal findings: Secondary | ICD-10-CM | POA: Diagnosis not present

## 2023-01-14 DIAGNOSIS — E7800 Pure hypercholesterolemia, unspecified: Secondary | ICD-10-CM | POA: Diagnosis not present

## 2023-01-14 DIAGNOSIS — Z131 Encounter for screening for diabetes mellitus: Secondary | ICD-10-CM

## 2023-01-14 NOTE — Progress Notes (Signed)
Established Patient Office Visit  Subjective   Patient ID: Rebekah [name], female    DOB: 01-09-1955  Age: 68 y.o. MRN: [id number]  Chief Complaint  Patient presents with   Follow-up    HPI Patient is a 68 year old obese female with OSA, chronic allergies, elevated LDL who presents to the clinic for follow up.  Patient continues healthy weight loss. Current weight taken in office is 161 lbs. She lost 9 lbs since her last visit 6 months ago. She reports that she has modified her diet to reduce the amount of sugar, carbs, and processed food that she consumes. She repots that she has not been exercising as much as she would like to. She has been having some muscular pain in her left shoulder over the past few months. She is going to physical therapy and has seen a reduction in her pain so she is expecting to increase her amount of exercise soon.  Patient reports that she gets headaches from having to switch between reading glasses and driving glasses. The headaches are alleviated with OTC pain meds. She has an upcoming appointment with an optometrist and is interested in discussing LASIK eye surgery.   ROS All other systems reviewed and are negative    Objective:     BP 135/62   Pulse 62   Ht '5\' 1"'$  (1.549 m)   Wt 161 lb (73 kg)   LMP 12/31/2009 (Approximate)   SpO2 100%   BMI 30.42 kg/m  BP Readings from Last 3 Encounters:  01/14/23 135/62  07/11/22 134/72  06/12/22 125/78   Wt Readings from Last 3 Encounters:  01/14/23 161 lb (73 kg)  07/11/22 170 lb (77.1 kg)  06/12/22 178 lb (80.7 kg)      Physical Exam Constitutional:      Appearance: Normal appearance. She is obese.  HENT:     Right Ear: Hearing and ear canal normal. Tympanic membrane is scarred.     Left Ear: Hearing and ear canal normal. A middle ear effusion (mild) is present.  Cardiovascular:     Rate and Rhythm: Normal rate and regular rhythm.     Heart sounds: Normal heart sounds.  Pulmonary:     Effort:  Pulmonary effort is normal.     Breath sounds: Normal breath sounds.  Neurological:     Mental Status: She is alert.      The 10-year ASCVD risk score (Arnett DK, et al., 2019) is: 6.6%    Assessment & Plan:   Marland KitchenMarland KitchenMashayla was seen today for follow-up.  Diagnoses and all orders for this visit:  Routine physical examination -     TSH -     Lipid Panel w/reflex Direct LDL -     COMPLETE METABOLIC PANEL WITH GFR -     CBC with Differential/Platelet  Elevated LDL cholesterol level -     Lipid Panel w/reflex Direct LDL  Screening for thyroid disorder -     TSH  Screening for diabetes mellitus -     COMPLETE METABOLIC PANEL WITH GFR  Birt-Hogg-Dube syndrome -     CA 125 -     Cytology - non gyn  Continue healthy diet habits and incorporate exercise into her day. .. Discussed 150 minutes of exercise a week.  Encouraged vitamin D 1000 units and Calcium '1300mg'$  or 4 servings of dairy a day.  Mammogram/bone density UTD Colonoscopy UTD Declined covid/flu Pneumonia UTD  Get blood work today to assess cholesterol levels, thyroid  function, kidney function, and CBC  Urine cytology to monitor Birt Hogg-Dube syndrome and screen for ovarian cancer  Flonase to alleviate head congestion and allow for drainage of middle ear effusion  OTC pain meds to manage headaches

## 2023-01-15 ENCOUNTER — Encounter: Payer: Self-pay | Admitting: Physician Assistant

## 2023-01-15 DIAGNOSIS — M5451 Vertebrogenic low back pain: Secondary | ICD-10-CM | POA: Diagnosis not present

## 2023-01-15 DIAGNOSIS — M6281 Muscle weakness (generalized): Secondary | ICD-10-CM | POA: Diagnosis not present

## 2023-01-15 DIAGNOSIS — M542 Cervicalgia: Secondary | ICD-10-CM | POA: Diagnosis not present

## 2023-01-15 LAB — CBC WITH DIFFERENTIAL/PLATELET
Absolute Monocytes: 412 cells/uL (ref 200–950)
Basophils Absolute: 50 cells/uL (ref 0–200)
Basophils Relative: 0.7 %
Eosinophils Absolute: 128 cells/uL (ref 15–500)
Eosinophils Relative: 1.8 %
HCT: 43.4 % (ref 35.0–45.0)
Hemoglobin: 14.5 g/dL (ref 11.7–15.5)
Lymphs Abs: 2379 cells/uL (ref 850–3900)
MCH: 30.1 pg (ref 27.0–33.0)
MCHC: 33.4 g/dL (ref 32.0–36.0)
MCV: 90.2 fL (ref 80.0–100.0)
MPV: 12.5 fL (ref 7.5–12.5)
Monocytes Relative: 5.8 %
Neutro Abs: 4132 cells/uL (ref 1500–7800)
Neutrophils Relative %: 58.2 %
Platelets: 220 10*3/uL (ref 140–400)
RBC: 4.81 10*6/uL (ref 3.80–5.10)
RDW: 12.3 % (ref 11.0–15.0)
Total Lymphocyte: 33.5 %
WBC: 7.1 10*3/uL (ref 3.8–10.8)

## 2023-01-15 LAB — COMPLETE METABOLIC PANEL WITH GFR
AG Ratio: 1.8 (calc) (ref 1.0–2.5)
ALT: 25 U/L (ref 6–29)
AST: 20 U/L (ref 10–35)
Albumin: 4.6 g/dL (ref 3.6–5.1)
Alkaline phosphatase (APISO): 72 U/L (ref 37–153)
BUN: 16 mg/dL (ref 7–25)
CO2: 30 mmol/L (ref 20–32)
Calcium: 9.5 mg/dL (ref 8.6–10.4)
Chloride: 103 mmol/L (ref 98–110)
Creat: 0.67 mg/dL (ref 0.50–1.05)
Globulin: 2.5 g/dL (calc) (ref 1.9–3.7)
Glucose, Bld: 78 mg/dL (ref 65–99)
Potassium: 4.2 mmol/L (ref 3.5–5.3)
Sodium: 143 mmol/L (ref 135–146)
Total Bilirubin: 0.3 mg/dL (ref 0.2–1.2)
Total Protein: 7.1 g/dL (ref 6.1–8.1)
eGFR: 96 mL/min/{1.73_m2} (ref >=60)

## 2023-01-15 LAB — LIPID PANEL W/REFLEX DIRECT LDL
Cholesterol: 183 mg/dL (ref <200)
HDL: 68 mg/dL (ref >=50)
LDL Cholesterol (Calc): 99 mg/dL (calc)
Non-HDL Cholesterol (Calc): 115 mg/dL (calc) (ref <130)
Total CHOL/HDL Ratio: 2.7 (calc) (ref <5.0)
Triglycerides: 70 mg/dL (ref <150)

## 2023-01-15 LAB — TSH: TSH: 1.62 mIU/L (ref 0.40–4.50)

## 2023-01-15 NOTE — Progress Notes (Signed)
Letti,   Thyroid looks great.  Kidney, liver, glucose looks great.  WBC and hemoglobin looks good.  Cholesterol looks good.

## 2023-01-16 ENCOUNTER — Other Ambulatory Visit: Payer: Self-pay | Admitting: Physician Assistant

## 2023-01-16 DIAGNOSIS — D224 Melanocytic nevi of scalp and neck: Secondary | ICD-10-CM | POA: Diagnosis not present

## 2023-01-16 DIAGNOSIS — L503 Dermatographic urticaria: Secondary | ICD-10-CM | POA: Diagnosis not present

## 2023-01-16 DIAGNOSIS — Z85828 Personal history of other malignant neoplasm of skin: Secondary | ICD-10-CM | POA: Diagnosis not present

## 2023-01-16 DIAGNOSIS — L820 Inflamed seborrheic keratosis: Secondary | ICD-10-CM | POA: Diagnosis not present

## 2023-01-16 DIAGNOSIS — L814 Other melanin hyperpigmentation: Secondary | ICD-10-CM | POA: Diagnosis not present

## 2023-01-16 DIAGNOSIS — D225 Melanocytic nevi of trunk: Secondary | ICD-10-CM | POA: Diagnosis not present

## 2023-01-16 DIAGNOSIS — L821 Other seborrheic keratosis: Secondary | ICD-10-CM | POA: Diagnosis not present

## 2023-01-16 DIAGNOSIS — D2271 Melanocytic nevi of right lower limb, including hip: Secondary | ICD-10-CM | POA: Diagnosis not present

## 2023-01-16 DIAGNOSIS — D2272 Melanocytic nevi of left lower limb, including hip: Secondary | ICD-10-CM | POA: Diagnosis not present

## 2023-01-16 DIAGNOSIS — L720 Epidermal cyst: Secondary | ICD-10-CM | POA: Diagnosis not present

## 2023-01-16 DIAGNOSIS — L738 Other specified follicular disorders: Secondary | ICD-10-CM | POA: Diagnosis not present

## 2023-01-16 DIAGNOSIS — D1801 Hemangioma of skin and subcutaneous tissue: Secondary | ICD-10-CM | POA: Diagnosis not present

## 2023-01-16 LAB — CYTOLOGY - NON PAP

## 2023-01-16 MED ORDER — FLUCONAZOLE 150 MG PO TABS: 150.0000 mg | ORAL_TABLET | Freq: Once | ORAL | 0 refills | Status: AC

## 2023-01-16 NOTE — Progress Notes (Signed)
Negative for any abnormal cancer cells.  There was some fungus found in urine. Will send dilfucan to pharmacy.

## 2023-01-28 DIAGNOSIS — J301 Allergic rhinitis due to pollen: Secondary | ICD-10-CM | POA: Diagnosis not present

## 2023-01-28 DIAGNOSIS — J3089 Other allergic rhinitis: Secondary | ICD-10-CM | POA: Diagnosis not present

## 2023-01-28 DIAGNOSIS — M5451 Vertebrogenic low back pain: Secondary | ICD-10-CM | POA: Diagnosis not present

## 2023-01-28 DIAGNOSIS — M6281 Muscle weakness (generalized): Secondary | ICD-10-CM | POA: Diagnosis not present

## 2023-01-28 DIAGNOSIS — M542 Cervicalgia: Secondary | ICD-10-CM | POA: Diagnosis not present

## 2023-02-01 DIAGNOSIS — G4733 Obstructive sleep apnea (adult) (pediatric): Secondary | ICD-10-CM | POA: Diagnosis not present

## 2023-02-12 DIAGNOSIS — M542 Cervicalgia: Secondary | ICD-10-CM | POA: Diagnosis not present

## 2023-02-12 DIAGNOSIS — J3089 Other allergic rhinitis: Secondary | ICD-10-CM | POA: Diagnosis not present

## 2023-02-12 DIAGNOSIS — M5451 Vertebrogenic low back pain: Secondary | ICD-10-CM | POA: Diagnosis not present

## 2023-02-12 DIAGNOSIS — M6281 Muscle weakness (generalized): Secondary | ICD-10-CM | POA: Diagnosis not present

## 2023-02-12 DIAGNOSIS — J301 Allergic rhinitis due to pollen: Secondary | ICD-10-CM | POA: Diagnosis not present

## 2023-03-05 DIAGNOSIS — J301 Allergic rhinitis due to pollen: Secondary | ICD-10-CM | POA: Diagnosis not present

## 2023-03-05 DIAGNOSIS — J3089 Other allergic rhinitis: Secondary | ICD-10-CM | POA: Diagnosis not present

## 2023-03-18 ENCOUNTER — Ambulatory Visit (INDEPENDENT_AMBULATORY_CARE_PROVIDER_SITE_OTHER): Payer: Medicare Other | Admitting: Family Medicine

## 2023-03-18 ENCOUNTER — Encounter: Payer: Self-pay | Admitting: Family Medicine

## 2023-03-18 VITALS — BP 127/77 | HR 53 | Temp 97.7°F | Resp 20 | Ht 61.0 in | Wt 165.0 lb

## 2023-03-18 DIAGNOSIS — L98491 Non-pressure chronic ulcer of skin of other sites limited to breakdown of skin: Secondary | ICD-10-CM | POA: Diagnosis not present

## 2023-03-18 MED ORDER — MUPIROCIN 2 % EX OINT: TOPICAL_OINTMENT | Freq: Every day | CUTANEOUS | 0 refills | Status: DC

## 2023-03-18 MED ORDER — SULFAMETHOXAZOLE-TRIMETHOPRIM 800-160 MG PO TABS: 1.0000 | ORAL_TABLET | Freq: Two times a day (BID) | ORAL | 0 refills | Status: DC

## 2023-03-18 NOTE — Progress Notes (Signed)
   Acute Office Visit  Subjective:     Patient ID: Rebekah [name], female    DOB: Nov 20, 1955, 68 y.o.   MRN: [id number]  Chief Complaint  Patient presents with   Rash    Patient states that she has a rash on her stomach on the left side, She states the area is red, itchy, and seems to have pus     Rash Patient is in today for acute visit. Pt is new to me.   She reports a rash to her left flank that was present for the last 2 weeks. She says it started as a rash with pinpoint bumps. Now it's gotten worse. She has used baby powder and steroid ointment BID for it. She reports no pain to the area.   Review of Systems  Skin:  Positive for rash.  All other systems reviewed and are negative.      Objective:    BP 127/77   Pulse (!) 53   Temp 97.7 F (36.5 C) (Oral)   Resp 20   Ht 5\' 1"  (1.549 m)   Wt 165 lb (74.8 kg)   LMP 12/31/2009 (Approximate)   SpO2 98%   BMI 31.18 kg/m    Physical Exam Vitals and nursing note reviewed.  Constitutional:      Appearance: Normal appearance. She is normal weight.  HENT:     Head: Normocephalic and atraumatic.     Right Ear: External ear normal.     Left Ear: External ear normal.  Cardiovascular:     Rate and Rhythm: Bradycardia present.  Pulmonary:     Effort: Pulmonary effort is normal.  Skin:    Capillary Refill: Capillary refill takes less than 2 seconds.     Comments: Erythema and ulceration to left panniculus  Neurological:     General: No focal deficit present.     Mental Status: She is alert and oriented to person, place, and time. Mental status is at baseline.  Psychiatric:        Mood and Affect: Mood normal.        Behavior: Behavior normal.        Thought Content: Thought content normal.        Judgment: Judgment normal.   No results found for any visits on 03/18/23.      Assessment & Plan:   Problem List Items Addressed This Visit   None  Skin ulcer, limited to breakdown of skin ([place]) -      Sulfamethoxazole-Trimethoprim; Take 1 tablet by mouth 2 (two) times daily.  Dispense: 14 tablet; Refill: 0 -     Mupirocin; Apply topically daily.  Dispense: 30 g; Refill: 0   Pressure ulcer to left panniculus. Send in bactrim and mupirocin to use. Try to keep area clean and dry with gauze To see PCP in 1 week.   No orders of the defined types were placed in this encounter.   No follow-ups on file.  Leeanne Rio, MD

## 2023-03-25 ENCOUNTER — Ambulatory Visit (INDEPENDENT_AMBULATORY_CARE_PROVIDER_SITE_OTHER): Payer: Medicare Other | Admitting: Physician Assistant

## 2023-03-25 ENCOUNTER — Encounter: Payer: Self-pay | Admitting: Physician Assistant

## 2023-03-25 VITALS — BP 136/59 | HR 58 | Ht 61.0 in | Wt 157.0 lb

## 2023-03-25 DIAGNOSIS — L98491 Non-pressure chronic ulcer of skin of other sites limited to breakdown of skin: Secondary | ICD-10-CM | POA: Insufficient documentation

## 2023-03-25 NOTE — Progress Notes (Signed)
Established Patient Office Visit  Subjective   Patient ID: Rebekah [name], female    DOB: [birth year]  Age: 68 y.o. MRN: [id number]  Chief Complaint  Patient presents with   Follow-up    HPI Pt is a 68 yo female who presents to the clinic for skin ulcer follow up. 1 week ago she was seen and given vasoline gauze, bactrim and bactroban. She does feel like ulcer is better. No pain. Ulcer does have some discharge. She is going on a cruise this Thursday and wants it rechecked. No fever or chills.  .. Active Ambulatory Problems    Diagnosis Date Noted   Elevated LDL cholesterol level 04/14/2014   Allergic rhinitis 04/14/2014   Seasonal allergies 04/14/2014   OAB (overactive bladder) 04/14/2014   Chronic allergic rhinitis 05/30/2014   Squamous cell carcinoma 09/21/2014   Class 1 obesity due to excess calories without serious comorbidity with body mass index (BMI) of 32.0 to 32.9 in adult 01/11/2016   Rhinitis, allergic 06/11/2016   Hematuria 05/27/2018   Overweight (BMI 25.0-29.9) 05/27/2018   Epigastric pain 01/28/2019   Gastroesophageal reflux disease with esophagitis without hemorrhage 01/28/2019   Newly recognized heart murmur 01/28/2019   Bradycardia 01/28/2019   Stress due to family tension 11/09/2019   OSA (obstructive sleep apnea) 11/09/2019   Allergy to mold 01/22/2020   Adjustment disorder with mixed anxiety and depressed mood 02/26/2020   Family history of ovarian cancer    Family history of breast cancer    Family history of bladder cancer    Genetic testing 02/13/2021   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    Objective:     BP (!) 136/59   Pulse (!) 58   Ht 5\' 1"  (1.549 m)   Wt 157 lb (71.2 kg)   LMP 12/31/2009 (Approximate)   SpO2 100%   BMI 29.66 kg/m  BP Readings from Last 3 Encounters:  03/25/23 (!) 136/59  03/18/23 127/77  01/14/23 135/62   Wt Readings from Last 3 Encounters:  03/25/23 157 lb (71.2 kg)  03/18/23 165 lb (74.8 kg)  01/14/23 161 lb (73 kg)      Physical Exam Skin:    Comments: Area of 5cm by 1cm ulcer of first layer of skin with some scant drainage and maceration with small area of erythema in the left panniculus.   Neurological:     General: No focal deficit present.     Mental Status: She is alert and oriented to person, place, and time.       The 10-year ASCVD risk score (Arnett DK, et al., 2019) is: 6.2%    Assessment & Plan:  Marland KitchenMarland KitchenSonni was seen today for follow-up.  Diagnoses and all orders for this visit:  Skin ulcer, limited to breakdown of skin ([place])   Avoid pool water and limit ocean water exposure Use duoderm every  2-3 days and change Clean with soap and water Follow up as needed or if symptoms worsening   Iran Planas, PA-C

## 2023-03-25 NOTE — Patient Instructions (Signed)
Keep cover with duoderm every 2-3 days Clean with mild soap and water

## 2023-03-26 DIAGNOSIS — J301 Allergic rhinitis due to pollen: Secondary | ICD-10-CM | POA: Diagnosis not present

## 2023-03-26 DIAGNOSIS — J3089 Other allergic rhinitis: Secondary | ICD-10-CM | POA: Diagnosis not present

## 2023-04-08 ENCOUNTER — Encounter: Payer: Self-pay | Admitting: Physician Assistant

## 2023-04-12 ENCOUNTER — Ambulatory Visit (INDEPENDENT_AMBULATORY_CARE_PROVIDER_SITE_OTHER): Payer: Medicare Other | Admitting: Physician Assistant

## 2023-04-12 ENCOUNTER — Encounter: Payer: Self-pay | Admitting: Physician Assistant

## 2023-04-12 VITALS — BP 119/55 | HR 52 | Ht 61.0 in | Wt 160.0 lb

## 2023-04-12 DIAGNOSIS — Z803 Family history of malignant neoplasm of breast: Secondary | ICD-10-CM | POA: Diagnosis not present

## 2023-04-12 DIAGNOSIS — N644 Mastodynia: Secondary | ICD-10-CM

## 2023-04-12 DIAGNOSIS — Z842 Family history of other diseases of the genitourinary system: Secondary | ICD-10-CM | POA: Diagnosis not present

## 2023-04-12 DIAGNOSIS — N6313 Unspecified lump in the right breast, lower outer quadrant: Secondary | ICD-10-CM | POA: Diagnosis not present

## 2023-04-12 NOTE — Patient Instructions (Signed)
Will order breast imaging.  

## 2023-04-12 NOTE — Progress Notes (Signed)
   Acute Office Visit  Subjective:     Patient ID: Rebekah [name], female    DOB: 1955-07-21, 68 y.o.   MRN: [id number]  Chief Complaint  Patient presents with   Breast Mass    HPI Patient is in today for right breast lump and tenderness in the lower outer breast, she felt in the shower this Tuesday. She has had breast cyst before but no other abnormality. Last mammogram was 10/2022 and normal. She did have breast augmentation surgery 10 years ago.   ..  Active Ambulatory Problems    Diagnosis Date Noted   Elevated LDL cholesterol level 04/14/2014   Allergic rhinitis 04/14/2014   Seasonal allergies 04/14/2014   OAB (overactive bladder) 04/14/2014   Chronic allergic rhinitis 05/30/2014   Squamous cell carcinoma 09/21/2014   Class 1 obesity due to excess calories without serious comorbidity with body mass index (BMI) of 32.0 to 32.9 in adult 01/11/2016   Rhinitis, allergic 06/11/2016   Hematuria 05/27/2018   Overweight (BMI 25.0-29.9) 05/27/2018   Epigastric pain 01/28/2019   Gastroesophageal reflux disease with esophagitis without hemorrhage 01/28/2019   Newly recognized heart murmur 01/28/2019   Bradycardia 01/28/2019   Stress due to family tension 11/09/2019   OSA (obstructive sleep apnea) 11/09/2019   Allergy to mold 01/22/2020   Adjustment disorder with mixed anxiety and depressed mood 02/26/2020   Family history of ovarian cancer    Family history of breast cancer    Family history of bladder cancer    Genetic testing 02/13/2021   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   DDD (degenerative disc disease), cervical 05/08/2021   DDD (degenerative disc disease), lumbar 05/08/2021   Chronic bilateral low back pain without sciatica 11/06/2021   Primary insomnia 11/06/2021   Burping 03/19/2022   Bilateral foot pain 03/19/2022   Indigestion 03/19/2022   Skin ulcer, limited to breakdown of skin 03/25/2023   Family history of breast cyst 04/12/2023   Mass of lower outer  quadrant of right breast 04/12/2023   Breast pain, right 04/12/2023   Resolved Ambulatory Problems    Diagnosis Date Noted   Chest pain 01/28/2019   Past Medical History:  Diagnosis Date   Allergy    History of COVID-19 05/31/2021   Hyperlipidemia    Osteopenia 2020   Perimenopause       ROS  See HPI.     Objective:    LMP 12/31/2009 (Approximate)  BP Readings from Last 3 Encounters:  04/12/23 (!) 119/55  03/25/23 (!) 136/59  03/18/23 127/77   Wt Readings from Last 3 Encounters:  04/12/23 160 lb (72.6 kg)  03/25/23 157 lb (71.2 kg)  03/18/23 165 lb (74.8 kg)      Physical Exam  Right outer lower lateral breast tenderness to palpation and patient palpated a lump but I can not palpate today. Area of concern over well healed scar from augmentation.  No redness, swelling, warmth.  No changes or concerns with nipple.     Assessment & Plan:  Marland KitchenMarland KitchenKatelind was seen today for breast mass.  Diagnoses and all orders for this visit:  Mass of lower outer quadrant of right breast -     US BREAST COMPLETE UNI RIGHT INC AXILLA  Family history of breast cancer  Family history of breast cyst  Breast pain, right -     US BREAST COMPLETE UNI RIGHT INC AXILLA   U/S ordered for breast Follow up as needed  Tandy Gaw, PA-C

## 2023-04-18 ENCOUNTER — Other Ambulatory Visit: Payer: Self-pay | Admitting: Physician Assistant

## 2023-04-18 DIAGNOSIS — N6313 Unspecified lump in the right breast, lower outer quadrant: Secondary | ICD-10-CM

## 2023-04-18 DIAGNOSIS — N644 Mastodynia: Secondary | ICD-10-CM

## 2023-04-23 DIAGNOSIS — J3089 Other allergic rhinitis: Secondary | ICD-10-CM | POA: Diagnosis not present

## 2023-04-23 DIAGNOSIS — J301 Allergic rhinitis due to pollen: Secondary | ICD-10-CM | POA: Diagnosis not present

## 2023-05-01 ENCOUNTER — Telehealth: Payer: Self-pay | Admitting: Physician Assistant

## 2023-05-01 NOTE — Telephone Encounter (Signed)
Called patient to schedule Medicare Annual Wellness Visit (AWV). Left message for patient to call back and schedule Medicare Annual Wellness Visit (AWV).  Last date of AWV: 2023  Please schedule an appointment at any time with [facility].  If any questions, please contact me at [phone].  Thank you ,  Cira Servant Patient Access Advocate II Direct Dial: [phone]

## 2023-05-12 ENCOUNTER — Other Ambulatory Visit: Payer: Self-pay | Admitting: Physician Assistant

## 2023-05-14 DIAGNOSIS — J3089 Other allergic rhinitis: Secondary | ICD-10-CM | POA: Diagnosis not present

## 2023-05-14 DIAGNOSIS — J301 Allergic rhinitis due to pollen: Secondary | ICD-10-CM | POA: Diagnosis not present

## 2023-05-17 DIAGNOSIS — G4733 Obstructive sleep apnea (adult) (pediatric): Secondary | ICD-10-CM | POA: Diagnosis not present

## 2023-05-21 DIAGNOSIS — J301 Allergic rhinitis due to pollen: Secondary | ICD-10-CM | POA: Diagnosis not present

## 2023-05-21 DIAGNOSIS — J3089 Other allergic rhinitis: Secondary | ICD-10-CM | POA: Diagnosis not present

## 2023-05-31 ENCOUNTER — Other Ambulatory Visit: Payer: Self-pay | Admitting: Physician Assistant

## 2023-05-31 ENCOUNTER — Ambulatory Visit
Admission: RE | Admit: 2023-05-31 | Discharge: 2023-05-31 | Disposition: A | Discharge status: Home / Self Care | Payer: Medicare Other | Source: Ambulatory Visit | Attending: Physician Assistant | Admitting: Physician Assistant

## 2023-05-31 DIAGNOSIS — R92323 Mammographic fibroglandular density, bilateral breasts: Secondary | ICD-10-CM | POA: Diagnosis not present

## 2023-05-31 DIAGNOSIS — N6313 Unspecified lump in the right breast, lower outer quadrant: Secondary | ICD-10-CM

## 2023-05-31 DIAGNOSIS — N644 Mastodynia: Secondary | ICD-10-CM

## 2023-05-31 DIAGNOSIS — R92321 Mammographic fibroglandular density, right breast: Secondary | ICD-10-CM | POA: Diagnosis not present

## 2023-05-31 DIAGNOSIS — R92322 Mammographic fibroglandular density, left breast: Secondary | ICD-10-CM | POA: Diagnosis not present

## 2023-06-03 NOTE — Progress Notes (Signed)
GREAT news. Suspicious abnormalities. Bilateral screening in 1 year.

## 2023-06-04 DIAGNOSIS — J301 Allergic rhinitis due to pollen: Secondary | ICD-10-CM | POA: Diagnosis not present

## 2023-06-04 DIAGNOSIS — J3089 Other allergic rhinitis: Secondary | ICD-10-CM | POA: Diagnosis not present

## 2023-06-11 ENCOUNTER — Other Ambulatory Visit: Payer: Self-pay | Admitting: Physician Assistant

## 2023-06-11 DIAGNOSIS — E7800 Pure hypercholesterolemia, unspecified: Secondary | ICD-10-CM

## 2023-06-11 DIAGNOSIS — F5101 Primary insomnia: Secondary | ICD-10-CM

## 2023-06-25 DIAGNOSIS — J301 Allergic rhinitis due to pollen: Secondary | ICD-10-CM | POA: Diagnosis not present

## 2023-06-25 DIAGNOSIS — J3089 Other allergic rhinitis: Secondary | ICD-10-CM | POA: Diagnosis not present

## 2023-07-15 ENCOUNTER — Encounter: Payer: Self-pay | Admitting: Physician Assistant

## 2023-07-15 ENCOUNTER — Ambulatory Visit (INDEPENDENT_AMBULATORY_CARE_PROVIDER_SITE_OTHER): Payer: Medicare Other | Admitting: Physician Assistant

## 2023-07-15 ENCOUNTER — Other Ambulatory Visit: Payer: Self-pay | Admitting: Physician Assistant

## 2023-07-15 VITALS — BP 126/65 | HR 49 | Ht 61.0 in | Wt 159.0 lb

## 2023-07-15 DIAGNOSIS — R5383 Other fatigue: Secondary | ICD-10-CM | POA: Diagnosis not present

## 2023-07-15 DIAGNOSIS — E7800 Pure hypercholesterolemia, unspecified: Secondary | ICD-10-CM

## 2023-07-15 DIAGNOSIS — R42 Dizziness and giddiness: Secondary | ICD-10-CM | POA: Insufficient documentation

## 2023-07-15 DIAGNOSIS — G4733 Obstructive sleep apnea (adult) (pediatric): Secondary | ICD-10-CM

## 2023-07-15 DIAGNOSIS — Z79899 Other long term (current) drug therapy: Secondary | ICD-10-CM | POA: Diagnosis not present

## 2023-07-15 LAB — CBC WITH DIFFERENTIAL/PLATELET
Absolute Monocytes: 511 cells/uL (ref 200–950)
Basophils Absolute: 41 cells/uL (ref 0–200)
Eosinophils Absolute: 159 cells/uL (ref 15–500)
Eosinophils Relative: 2.3 %
HCT: 41.4 % (ref 35.0–45.0)
Lymphs Abs: 1911 cells/uL (ref 850–3900)
MCH: 29.4 pg (ref 27.0–33.0)
MCHC: 31.9 g/dL — ABNORMAL LOW (ref 32.0–36.0)
Neutro Abs: 4278 cells/uL (ref 1500–7800)
Neutrophils Relative %: 62 %
RBC: 4.49 10*6/uL (ref 3.80–5.10)
WBC: 6.9 10*3/uL (ref 3.8–10.8)

## 2023-07-15 MED ORDER — MECLIZINE HCL 25 MG PO TABS
25.0000 mg | ORAL_TABLET | Freq: Three times a day (TID) | ORAL | 0 refills | Status: DC | PRN
Start: 2023-07-15 — End: 2023-10-22

## 2023-07-15 NOTE — Progress Notes (Signed)
Established Patient Office Visit  Subjective   Patient ID: Rebekah [name], female    DOB: 1955/08/20  Age: 68 y.o. MRN: [id number]  Chief Complaint  Patient presents with   Follow-up    HPI Pt is a 68 yo female who presents to the clinic for 6 month follow up.   She is doing well weight diet and exercise. She is still eating really healthy. She does have a few concerns.   She is getting some room spinning vertigo symptoms at bedtime when she lays down from time to time. Seems to go away in the morning. No nausea or vomiting.   She started a new "renew" vitamin at bedtime. She has noticed being a lot more tired in the mornings. Wonders what she should do. Continues to take trazodone at bedtime.   She would like to get off all medication including cholesterol.     Patient Active Problem List   Diagnosis Date Noted   Low energy 07/15/2023   Vertigo 07/15/2023   Family history of breast cyst 04/12/2023   Mass of lower outer quadrant of right breast 04/12/2023   Breast pain, right 04/12/2023   Skin ulcer, limited to breakdown of skin ([facility]) 03/25/2023   Burping 03/19/2022   Bilateral foot pain 03/19/2022   Indigestion 03/19/2022   Chronic bilateral low back pain without sciatica 11/06/2021   Primary insomnia 11/06/2021   DDD (degenerative disc disease), cervical 05/08/2021   DDD (degenerative disc disease), lumbar 05/08/2021   Genetic testing 02/13/2021   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   Family history of ovarian cancer    Family history of breast cancer    Family history of bladder cancer    Adjustment disorder with mixed anxiety and depressed mood 02/26/2020   Allergy to mold 01/22/2020   Stress due to family tension 11/09/2019   OSA (obstructive sleep apnea) 11/09/2019   Epigastric pain 01/28/2019   Gastroesophageal reflux disease with esophagitis without hemorrhage 01/28/2019   Newly recognized heart murmur 01/28/2019   Bradycardia 01/28/2019   Hematuria  05/27/2018   Overweight (BMI 25.0-29.9) 05/27/2018   Rhinitis, allergic 06/11/2016   Class 1 obesity due to excess calories without serious comorbidity with body mass index (BMI) of 32.0 to 32.9 in adult 01/11/2016   Squamous cell carcinoma 09/21/2014   Chronic allergic rhinitis 05/30/2014   Elevated LDL cholesterol level 04/14/2014   Allergic rhinitis 04/14/2014   Seasonal allergies 04/14/2014   OAB (overactive bladder) 04/14/2014   Past Medical History:  Diagnosis Date   Allergy    --takes allergy injections   Family history of bladder cancer    Family history of breast cancer    Family history of ovarian cancer    History of COVID-19 05/31/2021   Hyperlipidemia    Osteopenia 2020   Perimenopause    Past Surgical History:  Procedure Laterality Date   BREAST BIOPSY     DILATION AND CURETTAGE OF UTERUS  10/31/2010   Hysteroscopy with dilation and curettage - Dr. Edward Jolly - [facility]   KNEE SURGERY Bilateral    REDUCTION MAMMAPLASTY Bilateral    REFRACTIVE SURGERY Bilateral    Family History  Problem Relation Age of Onset   Hypertension Mother    Stroke Mother    Cancer Mother 91       ovarian ca, hx of TTP   Hyperlipidemia Mother    Ovarian cancer Mother    Hypertension Father    Cancer Father 34       bladder  ca   Hyperlipidemia Father    Dementia Father        lewy body dementia   Anemia Sister    Mental illness Sister        mentally disabled--lives in group home in [place]   Diabetes Brother    Multiple myeloma Brother    Asthma Brother        allergy induced   Ovarian cancer Maternal Grandmother    Breast cancer Cousin        dx 38s   Allergies  Allergen Reactions   Gramineae Pollens Other (See Comments)   Molds & Smuts Cough and Other (See Comments)    Stuffy nose   Tree Extract Other (See Comments)   Dust Mite Extract Other (See Comments)    Patient taking shots for dust mite and trees      ROS   See HPI.  Objective:     BP 126/65 (BP  Location: Right Arm, Patient Position: Sitting, Cuff Size: Normal)   Pulse (!) 49   Ht 5\' 1"  (1.549 m)   Wt 159 lb (72.1 kg)   LMP 12/31/2009 (Approximate)   SpO2 99%   BMI 30.04 kg/m  BP Readings from Last 3 Encounters:  07/15/23 126/65  04/12/23 (!) 119/55  03/25/23 (!) 136/59   Wt Readings from Last 3 Encounters:  07/15/23 159 lb (72.1 kg)  04/12/23 160 lb (72.6 kg)  03/25/23 157 lb (71.2 kg)   ..    07/15/2023    8:28 AM 04/12/2023   10:13 AM 03/25/2023    1:17 PM 07/11/2022    1:11 PM 04/03/2022    1:50 PM  Depression screen PHQ 2/9  Decreased Interest 0 0 0 0 0  Down, Depressed, Hopeless 0 0 0 0 0  PHQ - 2 Score 0 0 0 0 0  Altered sleeping     0  Tired, decreased energy     0  Change in appetite     0  Feeling bad or failure about yourself      0  Trouble concentrating     0  Moving slowly or fidgety/restless     0  Suicidal thoughts     0  PHQ-9 Score     0  Difficult doing work/chores     Not difficult at all      Physical Exam Constitutional:      Appearance: Normal appearance. She is obese.  HENT:     Head: Normocephalic.  Neck:     Vascular: No carotid bruit.  Cardiovascular:     Rate and Rhythm: Normal rate and regular rhythm.  Pulmonary:     Effort: Pulmonary effort is normal.     Breath sounds: Normal breath sounds.  Musculoskeletal:     Cervical back: Normal range of motion and neck supple. No rigidity or tenderness.     Right lower leg: No edema.     Left lower leg: No edema.  Lymphadenopathy:     Cervical: No cervical adenopathy.  Neurological:     General: No focal deficit present.     Mental Status: She is alert and oriented to person, place, and time.  Psychiatric:        Mood and Affect: Mood normal.      Last CBC Lab Results  Component Value Date   WBC 7.1 01/14/2023   HGB 14.5 01/14/2023   HCT 43.4 01/14/2023   MCV 90.2 01/14/2023   MCH 30.1 01/14/2023  RDW 12.3 01/14/2023   PLT 220 01/14/2023   Last metabolic  panel Lab Results  Component Value Date   GLUCOSE 78 01/14/2023   NA 143 01/14/2023   K 4.2 01/14/2023   CL 103 01/14/2023   CO2 30 01/14/2023   BUN 16 01/14/2023   CREATININE 0.67 01/14/2023   EGFR 96 01/14/2023   CALCIUM 9.5 01/14/2023   PROT 7.1 01/14/2023   ALBUMIN 4.2 01/11/2016   BILITOT 0.3 01/14/2023   ALKPHOS 59 01/11/2016   AST 20 01/14/2023   ALT 25 01/14/2023   Last lipids Lab Results  Component Value Date   CHOL 183 01/14/2023   HDL 68 01/14/2023   LDLCALC 99 01/14/2023   TRIG 70 01/14/2023   CHOLHDL 2.7 01/14/2023    Last thyroid functions Lab Results  Component Value Date   TSH 1.62 01/14/2023       The 10-year ASCVD risk score (Arnett DK, et al., 2019) is: 6%    Assessment & Plan:  Marland KitchenMarland KitchenJenyfer was seen today for follow-up.  Diagnoses and all orders for this visit:  Low energy -     B12 and Folate Panel -     TSH -     CBC w/Diff/Platelet -     COMPLETE METABOLIC PANEL WITH GFR  OSA (obstructive sleep apnea)  Medication management -     B12 and Folate Panel -     TSH -     CBC w/Diff/Platelet -     COMPLETE METABOLIC PANEL WITH GFR  Elevated LDL cholesterol level  Vertigo -     meclizine (ANTIVERT) 25 MG tablet; Take 1 tablet (25 mg total) by mouth 3 (three) times daily as needed for dizziness.   Low energy in morning could be due to supplement she started taking at night that has GABA and magnesium in it with trazodone. Cut trazodone in half and continue supplement. If still having no problem with sleeping stop trazodone.  Labs ordered to evaluate other causes of fatigue  Discussed importance and benefits for statin She is tolerating and numbers look great For now stay on it  Discussed vertigo Antivert given as needed Discussed epley maneuvers as well and HO given Follow up if continues or worsens Make sure to control allergy symptoms    Return in about 6 months (around 01/15/2024).    Tandy Gaw, PA-C

## 2023-07-15 NOTE — Patient Instructions (Addendum)
Cut trazodone in half continue on renew supplement  Antivert as needed with epley manuevers as needed  Benign Positional Vertigo Vertigo is the feeling that you or your surroundings are moving when they are not. Benign positional vertigo is the most common form of vertigo. This is usually a harmless condition (benign). This condition is positional. This means that symptoms are triggered by certain movements and positions. This condition can be dangerous if it occurs while you are doing something that could cause harm to yourself or others. This includes activities such as driving or operating machinery. What are the causes? The inner ear has fluid-filled canals that help your brain sense movement and balance. When the fluid moves, the brain receives messages about your body's position. With benign positional vertigo, calcium crystals in the inner ear break free and disturb the inner ear area. This causes your brain to receive confusing messages about your body's position. What increases the risk? You are more likely to develop this condition if: You are a woman. You are 67 years of age or older. You have recently had a head injury. You have an inner ear disease. What are the signs or symptoms? Symptoms of this condition usually happen when you move your head or your eyes in different directions. Symptoms may start suddenly and usually last for less than a minute. They include: Loss of balance and falling. Feeling like you are spinning or moving. Feeling like your surroundings are spinning or moving. Nausea and vomiting. Blurred vision. Dizziness. Involuntary eye movement (nystagmus). Symptoms can be mild and cause only minor problems, or they can be severe and interfere with daily life. Episodes of benign positional vertigo may return (recur) over time. Symptoms may also improve over time. How is this diagnosed? This condition may be diagnosed based on: Your medical history. A physical  exam of the head, neck, and ears. Positional tests to check for or stimulate vertigo. You may be asked to turn your head and change positions, such as going from sitting to lying down. A health care provider will watch for symptoms of vertigo. You may be referred to a health care provider who specializes in ear, nose, and throat problems (ENT or otolaryngologist) or a provider who specializes in disorders of the nervous system (neurologist). How is this treated?  This condition may be treated in a session in which your health care provider moves your head in specific positions to help the displaced crystals in your inner ear move. Treatment for this condition may take several sessions. Surgery may be needed in severe cases, but this is rare. In some cases, benign positional vertigo may resolve on its own in 2-4 weeks. Follow these instructions at home: Safety Move slowly. Avoid sudden body or head movements or certain positions, as told by your health care provider. Avoid driving or operating machinery until your health care provider says it is safe. Avoid doing any tasks that would be dangerous to you or others if vertigo occurs. If you have trouble walking or keeping your balance, try using a cane for stability. If you feel dizzy or unstable, sit down right away. Return to your normal activities as told by your health care provider. Ask your health care provider what activities are safe for you. General instructions Take over-the-counter and prescription medicines only as told by your health care provider. Drink enough fluid to keep your urine pale yellow. Keep all follow-up visits. This is important. Contact a health care provider if: You have a  fever. Your condition gets worse or you develop new symptoms. Your family or friends notice any behavioral changes. You have nausea or vomiting that gets worse. You have numbness or a prickling and tingling sensation. Get help right away if  you: Have difficulty speaking or moving. Are always dizzy or faint. Develop severe headaches. Have weakness in your legs or arms. Have changes in your hearing or vision. Develop a stiff neck. Develop sensitivity to light. These symptoms may represent a serious problem that is an emergency. Do not wait to see if the symptoms will go away. Get medical help right away. Call your local emergency services (911 in the [place] Do not drive yourself to the hospital. Summary Vertigo is the feeling that you or your surroundings are moving when they are not. Benign positional vertigo is the most common form of vertigo. This condition is caused by calcium crystals in the inner ear that become displaced. This causes a disturbance in an area of the inner ear that helps your brain sense movement and balance. Symptoms include loss of balance and falling, feeling that you or your surroundings are moving, nausea and vomiting, and blurred vision. This condition can be diagnosed based on symptoms, a physical exam, and positional tests. Follow safety instructions as told by your health care provider and keep all follow-up visits. This is important. This information is not intended to replace advice given to you by your health care provider. Make sure you discuss any questions you have with your health care provider. Document Revised: 11/16/2020 Document Reviewed: 11/16/2020 Elsevier Patient Education  2024 [employer].

## 2023-07-16 LAB — COMPLETE METABOLIC PANEL WITH GFR
AG Ratio: 2 (calc) (ref 1.0–2.5)
ALT: 19 U/L (ref 6–29)
AST: 19 U/L (ref 10–35)
Albumin: 4.3 g/dL (ref 3.6–5.1)
Alkaline phosphatase (APISO): 62 U/L (ref 37–153)
BUN: 18 mg/dL (ref 7–25)
CO2: 27 mmol/L (ref 20–32)
Calcium: 9.3 mg/dL (ref 8.6–10.4)
Chloride: 106 mmol/L (ref 98–110)
Creat: 0.66 mg/dL (ref 0.50–1.05)
Globulin: 2.2 g/dL (calc) (ref 1.9–3.7)
Glucose, Bld: 100 mg/dL — ABNORMAL HIGH (ref 65–99)
Potassium: 4.2 mmol/L (ref 3.5–5.3)
Sodium: 142 mmol/L (ref 135–146)
Total Bilirubin: 0.4 mg/dL (ref 0.2–1.2)
Total Protein: 6.5 g/dL (ref 6.1–8.1)
eGFR: 96 mL/min/{1.73_m2} (ref >=60)

## 2023-07-16 LAB — CBC WITH DIFFERENTIAL/PLATELET
Basophils Relative: 0.6 %
Hemoglobin: 13.2 g/dL (ref 11.7–15.5)
MCV: 92.2 fL (ref 80.0–100.0)
MPV: 12.6 fL — ABNORMAL HIGH (ref 7.5–12.5)
Monocytes Relative: 7.4 %
Platelets: 191 10*3/uL (ref 140–400)
RDW: 12 % (ref 11.0–15.0)
Total Lymphocyte: 27.7 %

## 2023-07-16 LAB — TSH: TSH: 1.83 mIU/L (ref 0.40–4.50)

## 2023-07-16 LAB — B12 AND FOLATE PANEL
Folate: 20.8 ng/mL
Vitamin B-12: 420 pg/mL (ref 200–1100)

## 2023-07-16 NOTE — Progress Notes (Signed)
Makinzey,   Thyroid looks good.  B12 is normal.  Kidney and liver look good.  No anemia!   No reason for fatigue in labs.

## 2023-07-23 DIAGNOSIS — J3089 Other allergic rhinitis: Secondary | ICD-10-CM | POA: Diagnosis not present

## 2023-07-23 DIAGNOSIS — G4733 Obstructive sleep apnea (adult) (pediatric): Secondary | ICD-10-CM | POA: Diagnosis not present

## 2023-07-23 DIAGNOSIS — J301 Allergic rhinitis due to pollen: Secondary | ICD-10-CM | POA: Diagnosis not present

## 2023-08-14 ENCOUNTER — Other Ambulatory Visit: Payer: Self-pay | Admitting: Physician Assistant

## 2023-08-14 DIAGNOSIS — E7800 Pure hypercholesterolemia, unspecified: Secondary | ICD-10-CM

## 2023-08-20 DIAGNOSIS — J3089 Other allergic rhinitis: Secondary | ICD-10-CM | POA: Diagnosis not present

## 2023-08-20 DIAGNOSIS — J301 Allergic rhinitis due to pollen: Secondary | ICD-10-CM | POA: Diagnosis not present

## 2023-08-29 DIAGNOSIS — M9901 Segmental and somatic dysfunction of cervical region: Secondary | ICD-10-CM | POA: Diagnosis not present

## 2023-08-29 DIAGNOSIS — M5411 Radiculopathy, occipito-atlanto-axial region: Secondary | ICD-10-CM | POA: Diagnosis not present

## 2023-09-13 ENCOUNTER — Other Ambulatory Visit: Payer: Self-pay | Admitting: Physician Assistant

## 2023-09-13 DIAGNOSIS — E7800 Pure hypercholesterolemia, unspecified: Secondary | ICD-10-CM

## 2023-09-17 DIAGNOSIS — J301 Allergic rhinitis due to pollen: Secondary | ICD-10-CM | POA: Diagnosis not present

## 2023-09-17 DIAGNOSIS — J3089 Other allergic rhinitis: Secondary | ICD-10-CM | POA: Diagnosis not present

## 2023-10-15 ENCOUNTER — Other Ambulatory Visit: Payer: Self-pay | Admitting: Physician Assistant

## 2023-10-15 DIAGNOSIS — E7800 Pure hypercholesterolemia, unspecified: Secondary | ICD-10-CM

## 2023-10-22 ENCOUNTER — Ambulatory Visit: Payer: Medicare Other

## 2023-10-22 ENCOUNTER — Telehealth: Payer: Self-pay

## 2023-10-22 ENCOUNTER — Other Ambulatory Visit: Payer: Self-pay | Admitting: Physician Assistant

## 2023-10-22 ENCOUNTER — Other Ambulatory Visit: Payer: Self-pay

## 2023-10-22 ENCOUNTER — Ambulatory Visit
Admission: RE | Admit: 2023-10-22 | Discharge: 2023-10-22 | Disposition: A | Discharge status: Home / Self Care | Payer: Medicare Other | Source: Ambulatory Visit | Attending: Family Medicine | Admitting: Family Medicine

## 2023-10-22 VITALS — BP 143/68 | HR 78 | Temp 101.2°F | Resp 16

## 2023-10-22 DIAGNOSIS — R059 Cough, unspecified: Secondary | ICD-10-CM | POA: Diagnosis not present

## 2023-10-22 DIAGNOSIS — R509 Fever, unspecified: Secondary | ICD-10-CM | POA: Diagnosis not present

## 2023-10-22 DIAGNOSIS — J189 Pneumonia, unspecified organism: Secondary | ICD-10-CM | POA: Diagnosis not present

## 2023-10-22 DIAGNOSIS — R9389 Abnormal findings on diagnostic imaging of other specified body structures: Secondary | ICD-10-CM

## 2023-10-22 DIAGNOSIS — R918 Other nonspecific abnormal finding of lung field: Secondary | ICD-10-CM | POA: Diagnosis not present

## 2023-10-22 DIAGNOSIS — R590 Localized enlarged lymph nodes: Secondary | ICD-10-CM | POA: Diagnosis not present

## 2023-10-22 DIAGNOSIS — J309 Allergic rhinitis, unspecified: Secondary | ICD-10-CM

## 2023-10-22 LAB — POCT INFLUENZA A/B
Influenza A, POC: NEGATIVE
Influenza B, POC: NEGATIVE

## 2023-10-22 LAB — POC SARS CORONAVIRUS 2 AG -  ED: SARS Coronavirus 2 Ag: NEGATIVE

## 2023-10-22 MED ORDER — PREDNISONE 20 MG PO TABS: 40.0000 mg | ORAL_TABLET | Freq: Every day | ORAL | 0 refills | Status: DC

## 2023-10-22 MED ORDER — AZITHROMYCIN 250 MG PO TABS: 250.0000 mg | ORAL_TABLET | Freq: Every day | ORAL | 0 refills | Status: DC

## 2023-10-22 MED ORDER — AMOXICILLIN-POT CLAVULANATE 875-125 MG PO TABS: 1.0000 | ORAL_TABLET | Freq: Two times a day (BID) | ORAL | 0 refills | Status: DC

## 2023-10-22 NOTE — Discharge Instructions (Signed)
Go home and rest.  Drink lots of fluids Take the Z-Pak as directed.  2 pills today then 1 a day until gone Take the Augmentin as directed.  1 pill 2 times a day for 7 days Take a probiotic while you are on these antibiotics Take prednisone daily to reduce lung inflammation Run a humidifier in your bedroom if there is 1 available See your doctor for follow-up next week

## 2023-10-22 NOTE — ED Triage Notes (Signed)
Sick since Friday night, has had cough and fever. Has been taking bc powders, "pearl cough drops", and alka seltzer.

## 2023-10-22 NOTE — ED Provider Notes (Signed)
[name]    CSN: [id number] Arrival date & time: 10/22/23  1153      History   Chief Complaint Chief Complaint  Patient presents with   Cough    Been sick since Friday and not getting better - Entered by patient    HPI Rebekah [name] is a 68 y.o. female.   Patient is here for cough, fever, weakness, pneumonia.  She has been sick since Friday and feels like she is getting worse over time.  Her chest feels congested.  She feels very weak.  She feels short of breath when she tries to walk.  She has no underlying lung disease.  She was never a smoker.  She has no significant history of malignancy.  She had a skin cancer removed from her lower leg almost 10 years ago.  No known exposure to illness.  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     Past Medical History:  Diagnosis Date   Allergy    --takes allergy injections   Family history of bladder cancer    Family history of breast cancer    Family history of ovarian cancer    History of COVID-19 05/31/2021   Hyperlipidemia    Osteopenia 2020   Perimenopause     Patient Active Problem List   Diagnosis Date Noted   Low energy 07/15/2023   Vertigo 07/15/2023   Family history of breast cyst 04/12/2023   Mass of lower outer quadrant of right breast 04/12/2023   Breast pain, right 04/12/2023   Skin ulcer, limited to breakdown of skin (HCC) 03/25/2023   Burping 03/19/2022   Bilateral foot pain 03/19/2022   Indigestion 03/19/2022   Chronic bilateral low back pain without sciatica 11/06/2021   Primary insomnia 11/06/2021   DDD (degenerative disc disease), cervical 05/08/2021   DDD (degenerative disc disease), lumbar 05/08/2021   Genetic testing 02/13/2021   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   Family history of ovarian cancer    Family history of breast cancer    Family history of bladder cancer    Adjustment disorder with mixed anxiety and depressed mood  02/26/2020   Allergy to mold 01/22/2020   Stress due to family tension 11/09/2019   OSA (obstructive sleep apnea) 11/09/2019   Epigastric pain 01/28/2019   Gastroesophageal reflux disease with esophagitis without hemorrhage 01/28/2019   Newly recognized heart murmur 01/28/2019   Bradycardia 01/28/2019   Hematuria 05/27/2018   Overweight (BMI 25.0-29.9) 05/27/2018   Rhinitis, allergic 06/11/2016   Class 1 obesity due to excess calories without serious comorbidity with body mass index (BMI) of 32.0 to 32.9 in adult 01/11/2016   Squamous cell carcinoma 09/21/2014   Chronic allergic rhinitis 05/30/2014   Elevated LDL cholesterol level 04/14/2014   Allergic rhinitis 04/14/2014   Seasonal allergies 04/14/2014   OAB (overactive bladder) 04/14/2014    Past Surgical History:  Procedure Laterality Date   BREAST BIOPSY     DILATION AND CURETTAGE OF UTERUS  10/31/2010   Hysteroscopy with dilation and curettage - Dr. Edward Jolly - [facility]   KNEE SURGERY Bilateral    REDUCTION MAMMAPLASTY Bilateral    REFRACTIVE SURGERY Bilateral     OB History     Gravida  3   Para  2   Term  2   Preterm      AB  1   Living  1      SAB      IAB  Ectopic      Multiple      Live Births               Home Medications    Prior to Admission medications   Medication Sig Start Date End Date Taking? Authorizing Provider  amoxicillin-clavulanate (AUGMENTIN) 875-125 MG tablet Take 1 tablet by mouth every 12 (twelve) hours. 10/22/23  Yes Eustace Moore, MD  azithromycin (ZITHROMAX) 250 MG tablet Take 1 tablet (250 mg total) by mouth daily. Take first 2 tablets together, then 1 every day until finished. 10/22/23  Yes Eustace Moore, MD  predniSONE (DELTASONE) 20 MG tablet Take 2 tablets (40 mg total) by mouth daily with breakfast. 10/22/23  Yes Eustace Moore, MD  AMBULATORY NON FORMULARY MEDICATION Medication Name:  Nutrifii Magnical-d    [provider]   AMBULATORY NON FORMULARY MEDICATION Medication Name:  Nutrifii rejuveniix    [provider]  AMBULATORY NON FORMULARY MEDICATION Medication Name:  Optimal-m    [provider]  AMBULATORY NON FORMULARY MEDICATION Medication Name:  Optimal-v    [provider]  fluticasone (FLONASE) 50 MCG/ACT nasal spray     [provider]  levocetirizine (XYZAL) 5 MG tablet Take 1 tablet (5 mg total) by mouth every evening. 07/11/22   Breeback, Jade L, PA-C  lovastatin (MEVACOR) 40 MG tablet TAKE ONE TABLET BY MOUTH AT BEDTIME 10/16/23   Breeback, Jade L, PA-C  traZODone (DESYREL) 50 MG tablet TAKE 1/2 TO 1 TABLET BY MOUTH EVERY NIGHT AT BEDTIME 06/11/23   Breeback, Lonna Cobb, PA-C  UNABLE TO FIND Med Name: Allergy Shots    [provider]    Family History Family History  Problem Relation Age of Onset   Hypertension Mother    Stroke Mother    Cancer Mother 59       ovarian ca, hx of TTP   Hyperlipidemia Mother    Ovarian cancer Mother    Hypertension Father    Cancer Father 10       bladder ca   Hyperlipidemia Father    Dementia Father        lewy body dementia   Anemia Sister    Mental illness Sister        mentally disabled--lives in group home in [place]   Diabetes Brother    Multiple myeloma Brother    Asthma Brother        allergy induced   Ovarian cancer Maternal Grandmother    Breast cancer Cousin        dx 28s    Social History Social History   Tobacco Use   Smoking status: Never   Smokeless tobacco: Never  Vaping Use   Vaping status: Never Used  Substance Use Topics   Alcohol use: Yes    Alcohol/week: 1.0 standard drink of alcohol    Types: 1 Glasses of wine per week   Drug use: Never     Allergies   Gramineae pollens, Molds & smuts, Tree extract, and Dust mite extract   Review of Systems Review of Systems See HPI  Physical Exam Triage Vital Signs ED Triage Vitals  Encounter Vitals Group     BP 10/22/23 1200  (!) 143/68     Systolic BP Percentile --      Diastolic BP Percentile --      Pulse Rate 10/22/23 1200 78     Resp 10/22/23 1200 16     Temp 10/22/23 1200 (!) 101.2 F (  38.4 C)     Temp Source 10/22/23 1200 Oral     SpO2 10/22/23 1200 94 %     Weight --      Height --      Head Circumference --      Peak Flow --      Pain Score 10/22/23 1204 9     Pain Loc --      Pain Education --      Exclude from Growth Chart --    No data found.  Updated Vital Signs BP (!) 143/68   Pulse 78   Temp (!) 101.2 F (38.4 C) (Oral)   Resp 16   LMP 12/31/2009 (Approximate)   SpO2 94%       Physical Exam Constitutional:      General: She is not in acute distress.    Appearance: She is well-developed and normal weight. She is ill-appearing.  HENT:     Head: Normocephalic and atraumatic.  Eyes:     Conjunctiva/sclera: Conjunctivae normal.     Pupils: Pupils are equal, round, and reactive to light.  Cardiovascular:     Rate and Rhythm: Normal rate.  Pulmonary:     Effort: Pulmonary effort is normal. No respiratory distress.     Breath sounds: Rhonchi and rales present.     Comments: Rales R>L Abdominal:     General: There is no distension.     Palpations: Abdomen is soft.  Musculoskeletal:        General: Normal range of motion.     Cervical back: Normal range of motion.  Skin:    General: Skin is warm and dry.  Neurological:     Mental Status: She is alert.      UC Treatments / Results  Labs (all labs ordered are listed, but only abnormal results are displayed) Labs Reviewed  POC SARS CORONAVIRUS 2 AG -  ED  POCT INFLUENZA A/B  Both tests are negative  EKG   Radiology DG Chest 2 View  Result Date: 10/22/2023 CLINICAL DATA:  Cough, fever. EXAM: CHEST - 2 VIEW COMPARISON:  None Available. FINDINGS: The heart size and mediastinal contours are within normal limits. Right infrahilar opacity is noted concerning for pneumonia or atelectasis. Possible nodular density is  seen just posterior to minor fissure on lateral projection. The visualized skeletal structures are unremarkable. IMPRESSION: Right infrahilar opacity is noted concerning for pneumonia or atelectasis. Possible nodular density seen just posterior to minor fissure; CT scan is recommended with intravenous contrast to rule out pulmonary nodule. These results were discussed by telephone at the time of interpretation on 10/22/2023 at 1:32 pm with provider Rica Mast , who verbally acknowledged these results. Electronically Signed   By: Lupita Raider M.D.   On: 10/22/2023 13:32   As discussed with Dr. Chilton Si, CT scan with contrast was ordered.  Unfortunately, contrast cannot be done on this campus today so CT without contrast is accomplished. Procedures Procedures (including critical care time)  Medications Ordered in UC Medications - No data to display  Initial Impression / Assessment and Plan / UC Course  I have reviewed the triage vital signs and the nursing notes.  Pertinent labs & imaging results that were available during my care of the patient were reviewed by me and considered in my medical decision making (see chart for details).     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 She is sent  home after the CT scan to start her antibiotics.  I will call her this evening when the scan has been read.  I have arranged for follow-up with her PCP within the week.  She knows to go to the ER if she is worse instead of better Final Clinical Impressions(s) / UC Diagnoses   Final diagnoses:  Community acquired pneumonia of right lung, unspecified part of lung     Discharge Instructions      Go home and rest.  Drink lots of fluids Take the Z-Pak as directed.  2 pills today then 1 a day until gone Take the Augmentin as directed.  1 pill 2 times a day for 7 days Take a probiotic while you are on these antibiotics Take prednisone daily to  reduce lung inflammation Run a humidifier in your bedroom if there is 1 available See your doctor for follow-up next week     ED Prescriptions     Medication Sig Dispense Auth. Provider   amoxicillin-clavulanate (AUGMENTIN) 875-125 MG tablet Take 1 tablet by mouth every 12 (twelve) hours. 14 tablet Eustace Moore, MD   azithromycin (ZITHROMAX) 250 MG tablet Take 1 tablet (250 mg total) by mouth daily. Take first 2 tablets together, then 1 every day until finished. 6 tablet Eustace Moore, MD   predniSONE (DELTASONE) 20 MG tablet Take 2 tablets (40 mg total) by mouth daily with breakfast. 10 tablet Eustace Moore, MD      PDMP not reviewed this encounter.   Eustace Moore, MD 10/22/23 [phone]

## 2023-10-22 NOTE — Telephone Encounter (Signed)
Patient inquiring how long to be out of work, dr. Delton See states rest of this week into next week, this was relayed to patient on phone

## 2023-10-23 ENCOUNTER — Ambulatory Visit: Payer: Medicare Other | Admitting: Physician Assistant

## 2023-10-25 ENCOUNTER — Telehealth: Payer: Self-pay | Admitting: Emergency Medicine

## 2023-10-25 NOTE — Telephone Encounter (Signed)
Patient called regarding her cough after being diagnosed with pneumonia on 10/22/2023.  Patient feels that her cough is getting somewhat worse, it is clearing up and she is feeling better, but concerned for the cough.  Patient spoke with Ovid Curd who instructed patient that this is normal when there is improvement in patient sx's.  Patient does have cough meds and will continue as directed.  Follow up with her PCP as needed.

## 2023-10-29 ENCOUNTER — Ambulatory Visit (INDEPENDENT_AMBULATORY_CARE_PROVIDER_SITE_OTHER): Payer: Medicare Other | Admitting: Physician Assistant

## 2023-10-29 ENCOUNTER — Encounter: Payer: Self-pay | Admitting: Physician Assistant

## 2023-10-29 VITALS — BP 158/78 | HR 67 | Ht 60.0 in | Wt 159.0 lb

## 2023-10-29 DIAGNOSIS — J189 Pneumonia, unspecified organism: Secondary | ICD-10-CM | POA: Diagnosis not present

## 2023-10-29 MED ORDER — HYDROCODONE BIT-HOMATROP MBR 5-1.5 MG/5ML PO SOLN: 5.0000 mL | Freq: Three times a day (TID) | ORAL | 0 refills | Status: DC | PRN

## 2023-10-29 NOTE — Progress Notes (Signed)
Established Patient Office Visit  Subjective   Patient ID: Rebekah [name], female    DOB: 1955-08-10  Age: 68 y.o. MRN: [id number]  Chief Complaint  Patient presents with   Hospitalization Follow-up    HPI Pt is a 68 yo female who presents to the clinic to follow up on CAP pneumonia of the right lower lung. She went to UC and CT was done without contrast. She was gien zpak, augmentin and prednisone. She finished her last augmentin this morning she is feeling better. She continues to feel weak and short of breath with exertion. No fever, chills, body aches. She continues to have a mildly productive cough. She request a little more cough syrup to use as needed.   .. Active Ambulatory Problems    Diagnosis Date Noted   Elevated LDL cholesterol level 04/14/2014   Allergic rhinitis 04/14/2014   Seasonal allergies 04/14/2014   OAB (overactive bladder) 04/14/2014   Chronic allergic rhinitis 05/30/2014   Squamous cell carcinoma 09/21/2014   Class 1 obesity due to excess calories without serious comorbidity with body mass index (BMI) of 32.0 to 32.9 in adult 01/11/2016   Rhinitis, allergic 06/11/2016   Hematuria 05/27/2018   Overweight (BMI 25.0-29.9) 05/27/2018   Epigastric pain 01/28/2019   Gastroesophageal reflux disease with esophagitis without hemorrhage 01/28/2019   Newly recognized heart murmur 01/28/2019   Bradycardia 01/28/2019   Stress due to family tension 11/09/2019   OSA (obstructive sleep apnea) 11/09/2019   Allergy to mold 01/22/2020   Adjustment disorder with mixed anxiety and depressed mood 02/26/2020   Family history of ovarian cancer    Family history of breast cancer    Family history of bladder cancer    Genetic testing 02/13/2021   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   DDD (degenerative disc disease), cervical 05/08/2021   DDD (degenerative disc disease), lumbar 05/08/2021   Chronic bilateral low back pain without sciatica 11/06/2021   Primary insomnia  11/06/2021   Burping 03/19/2022   Bilateral foot pain 03/19/2022   Indigestion 03/19/2022   Skin ulcer, limited to breakdown of skin (HCC) 03/25/2023   Family history of breast cyst 04/12/2023   Mass of lower outer quadrant of right breast 04/12/2023   Breast pain, right 04/12/2023   Low energy 07/15/2023   Vertigo 07/15/2023   Community acquired pneumonia of right lower lobe of lung 10/29/2023   Resolved Ambulatory Problems    Diagnosis Date Noted   Chest pain 01/28/2019   Past Medical History:  Diagnosis Date   Allergy    History of COVID-19 05/31/2021   Hyperlipidemia    Osteopenia 2020   Perimenopause      ROS See HPI>    Objective:     BP (!) 158/78   Pulse 67   Ht 5' (1.524 m)   Wt 159 lb (72.1 kg)   LMP 12/31/2009 (Approximate)   SpO2 98%   BMI 31.05 kg/m  BP Readings from Last 3 Encounters:  10/29/23 (!) 158/78  10/22/23 (!) 143/68  07/15/23 126/65   Wt Readings from Last 3 Encounters:  10/29/23 159 lb (72.1 kg)  07/15/23 159 lb (72.1 kg)  04/12/23 160 lb (72.6 kg)      Physical Exam Constitutional:      Appearance: Normal appearance. She is obese.  HENT:     Head: Normocephalic.     Right Ear: Tympanic membrane, ear canal and external ear normal. There is no impacted cerumen.     Left Ear: Tympanic membrane,  ear canal and external ear normal. There is no impacted cerumen.     Nose: Nose normal.     Mouth/Throat:     Mouth: Mucous membranes are moist.  Cardiovascular:     Rate and Rhythm: Normal rate and regular rhythm.  Pulmonary:     Effort: Pulmonary effort is normal.     Comments: Crackles still heard over right lower lung without wheezing Musculoskeletal:     Cervical back: Normal range of motion and neck supple. No tenderness.     Right lower leg: No edema.     Left lower leg: No edema.  Lymphadenopathy:     Cervical: No cervical adenopathy.  Neurological:     General: No focal deficit present.     Mental Status: She is alert  and oriented to person, place, and time.  Psychiatric:        Mood and Affect: Mood normal.      The 10-year ASCVD risk score (Arnett DK, et al., 2019) is: 9.2%    Assessment & Plan:  Marland KitchenMarland KitchenRyana was seen today for hospitalization follow-up.  Diagnoses and all orders for this visit:  Community acquired pneumonia of right lower lobe of lung -     HYDROcodone bit-homatropine (HYCODAN) 5-1.5 MG/5ML syrup; Take 5 mLs by mouth every 8 (eight) hours as needed for cough. -     CT Chest W Contrast; Future   Vital signs improving  Pulse ox 98 percent BP a little elevated but patient is still coughing Hycodan refilled for as needed usage 6 weeks CT order to make sure pneumonia cleared and no underlying masses Follow up sooner if symptoms worsening or not improving    Tandy Gaw, PA-C

## 2023-10-29 NOTE — Patient Instructions (Signed)
Repeat CT of chest in 6 weeks

## 2023-11-12 ENCOUNTER — Other Ambulatory Visit: Payer: Self-pay | Admitting: Physician Assistant

## 2023-11-12 DIAGNOSIS — E7800 Pure hypercholesterolemia, unspecified: Secondary | ICD-10-CM

## 2023-11-19 DIAGNOSIS — G4733 Obstructive sleep apnea (adult) (pediatric): Secondary | ICD-10-CM | POA: Diagnosis not present

## 2023-11-26 DIAGNOSIS — J3089 Other allergic rhinitis: Secondary | ICD-10-CM | POA: Diagnosis not present

## 2023-11-26 DIAGNOSIS — R053 Chronic cough: Secondary | ICD-10-CM | POA: Diagnosis not present

## 2023-11-26 DIAGNOSIS — J301 Allergic rhinitis due to pollen: Secondary | ICD-10-CM | POA: Diagnosis not present

## 2023-12-02 ENCOUNTER — Ambulatory Visit: Payer: Medicare Other

## 2023-12-02 DIAGNOSIS — J189 Pneumonia, unspecified organism: Secondary | ICD-10-CM

## 2023-12-02 DIAGNOSIS — R590 Localized enlarged lymph nodes: Secondary | ICD-10-CM | POA: Diagnosis not present

## 2023-12-02 MED ORDER — IOHEXOL 300 MG/ML  SOLN
100.0000 mL | Freq: Once | INTRAMUSCULAR | Status: AC | PRN
  Administered 2023-12-02: 75 mL via INTRAVENOUS

## 2023-12-12 ENCOUNTER — Other Ambulatory Visit: Payer: Self-pay | Admitting: Physician Assistant

## 2023-12-12 DIAGNOSIS — E7800 Pure hypercholesterolemia, unspecified: Secondary | ICD-10-CM

## 2023-12-16 NOTE — Progress Notes (Signed)
Complete resolution of right lower airspace disease. Great news. No follow up is needed.

## 2023-12-20 ENCOUNTER — Other Ambulatory Visit: Payer: Self-pay | Admitting: Emergency Medicine

## 2023-12-20 DIAGNOSIS — J309 Allergic rhinitis, unspecified: Secondary | ICD-10-CM

## 2023-12-20 DIAGNOSIS — F5101 Primary insomnia: Secondary | ICD-10-CM

## 2023-12-20 DIAGNOSIS — E7800 Pure hypercholesterolemia, unspecified: Secondary | ICD-10-CM

## 2024-01-02 ENCOUNTER — Encounter: Payer: Self-pay | Admitting: Physician Assistant

## 2024-01-07 DIAGNOSIS — R498 Other voice and resonance disorders: Secondary | ICD-10-CM | POA: Diagnosis not present

## 2024-01-07 DIAGNOSIS — J385 Laryngeal spasm: Secondary | ICD-10-CM | POA: Diagnosis not present

## 2024-01-07 DIAGNOSIS — J384 Edema of larynx: Secondary | ICD-10-CM | POA: Diagnosis not present

## 2024-01-07 DIAGNOSIS — J383 Other diseases of vocal cords: Secondary | ICD-10-CM | POA: Diagnosis not present

## 2024-01-07 DIAGNOSIS — R490 Dysphonia: Secondary | ICD-10-CM | POA: Diagnosis not present

## 2024-01-13 ENCOUNTER — Other Ambulatory Visit: Payer: Self-pay | Admitting: Physician Assistant

## 2024-01-13 ENCOUNTER — Ambulatory Visit (INDEPENDENT_AMBULATORY_CARE_PROVIDER_SITE_OTHER): Payer: Medicare Other | Admitting: Physician Assistant

## 2024-01-13 ENCOUNTER — Encounter: Payer: Self-pay | Admitting: Physician Assistant

## 2024-01-13 ENCOUNTER — Ambulatory Visit (INDEPENDENT_AMBULATORY_CARE_PROVIDER_SITE_OTHER): Payer: Medicare Other

## 2024-01-13 VITALS — BP 152/80 | HR 65 | Ht 60.0 in | Wt 163.0 lb

## 2024-01-13 DIAGNOSIS — R030 Elevated blood-pressure reading, without diagnosis of hypertension: Secondary | ICD-10-CM | POA: Insufficient documentation

## 2024-01-13 DIAGNOSIS — Z8041 Family history of malignant neoplasm of ovary: Secondary | ICD-10-CM | POA: Diagnosis not present

## 2024-01-13 DIAGNOSIS — R1031 Right lower quadrant pain: Secondary | ICD-10-CM | POA: Diagnosis not present

## 2024-01-13 DIAGNOSIS — E7800 Pure hypercholesterolemia, unspecified: Secondary | ICD-10-CM

## 2024-01-13 DIAGNOSIS — Z780 Asymptomatic menopausal state: Secondary | ICD-10-CM | POA: Diagnosis not present

## 2024-01-13 DIAGNOSIS — R319 Hematuria, unspecified: Secondary | ICD-10-CM | POA: Diagnosis not present

## 2024-01-13 LAB — POCT URINALYSIS DIP (CLINITEK)
Bilirubin, UA: NEGATIVE
Glucose, UA: NEGATIVE mg/dL
Ketones, POC UA: NEGATIVE mg/dL
Leukocytes, UA: NEGATIVE
Nitrite, UA: NEGATIVE
POC PROTEIN,UA: NEGATIVE
Spec Grav, UA: 1.015 (ref 1.010–1.025)
Urobilinogen, UA: 0.2 U/dL
pH, UA: 7 (ref 5.0–8.0)

## 2024-01-13 NOTE — Progress Notes (Signed)
 Established Patient Office Visit  Subjective   Patient ID: Rebekah [name], female    DOB: [birth year]  Age: 69 y.o. MRN: [id number]  Chief Complaint  Patient presents with   Groin Pain    Rt inguinal pain onset 1 week     HPI  Pt is a 69 yo female coming in to the office complaining of right inguinal pain since Thursday. Pt states that she typically experiences this pain at night when laying down and she describes it as a sharp, stabbing pain that sometimes radiates to her back. When this occurs she rates the pain a 10/10 on the pain scale and states that the pain gets so back it makes me sick to her stomach. She says that she will apply pressure to the area and that will help to slightly ease her pain. She has taken Motrin  and it has helped somewhat. At rest she says she has a dull, aching pain in that area (3/10). Pt states that she has had ovarian torsion in the past and this feels very similar. She denies abd pain or cramping, constipation and other bowel changes, urinary sxs, hematuria, vaginal bleeding or changes in vaginal discharge.   .. Active Ambulatory Problems    Diagnosis Date Noted   Elevated LDL cholesterol level 04/14/2014   Allergic rhinitis 04/14/2014   Seasonal allergies 04/14/2014   OAB (overactive bladder) 04/14/2014   Chronic allergic rhinitis 05/30/2014   Squamous cell carcinoma 09/21/2014   Class 1 obesity due to excess calories without serious comorbidity with body mass index (BMI) of 32.0 to 32.9 in adult 01/11/2016   Rhinitis, allergic 06/11/2016   Hematuria 05/27/2018   Overweight (BMI 25.0-29.9) 05/27/2018   Epigastric pain 01/28/2019   Gastroesophageal reflux disease with esophagitis without hemorrhage 01/28/2019   Newly recognized heart murmur 01/28/2019   Bradycardia 01/28/2019   Stress due to family tension 11/09/2019   OSA (obstructive sleep apnea) 11/09/2019   Allergy to mold 01/22/2020   Adjustment disorder with mixed anxiety and depressed  mood 02/26/2020   Family history of ovarian cancer    Family history of breast cancer    Family history of bladder cancer    Genetic testing 02/13/2021   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   DDD (degenerative disc disease), cervical 05/08/2021   DDD (degenerative disc disease), lumbar 05/08/2021   Chronic bilateral low back pain without sciatica 11/06/2021   Primary insomnia 11/06/2021   Burping 03/19/2022   Bilateral foot pain 03/19/2022   Indigestion 03/19/2022   Skin ulcer, limited to breakdown of skin (HCC) 03/25/2023   Family history of breast cyst 04/12/2023   Mass of lower outer quadrant of right breast 04/12/2023   Breast pain, right 04/12/2023   Low energy 07/15/2023   Vertigo 07/15/2023   Community acquired pneumonia of right lower lobe of lung 10/29/2023   Right lower quadrant abdominal pain 01/13/2024   Elevated blood pressure reading 01/13/2024   Resolved Ambulatory Problems    Diagnosis Date Noted   Chest pain 01/28/2019   Past Medical History:  Diagnosis Date   Allergy    History of COVID-19 05/31/2021   Hyperlipidemia    Osteopenia 2020   Perimenopause      ROS See HPI.    Objective:     BP (!) 152/80   Pulse 65   Ht 5' (1.524 m)   Wt 163 lb (73.9 kg)   LMP 12/31/2009 (Approximate)   SpO2 99%   BMI 31.83 kg/m  BP Readings from  Last 3 Encounters:  01/13/24 (!) 152/80  10/29/23 (!) 158/78  10/22/23 (!) 143/68   Wt Readings from Last 3 Encounters:  01/13/24 163 lb (73.9 kg)  10/29/23 159 lb (72.1 kg)  07/15/23 159 lb (72.1 kg)    .SABRA Results for orders placed or performed in visit on 01/13/24  POCT URINALYSIS DIP (CLINITEK)   Collection Time: 01/13/24 10:46 AM  Result Value Ref Range   Color, UA yellow yellow   Clarity, UA clear clear   Glucose, UA negative negative mg/dL   Bilirubin, UA negative negative   Ketones, POC UA negative negative mg/dL   Spec Grav, UA 8.984 8.989 - 1.025   Blood, UA trace-intact (A) negative   pH, UA 7.0  5.0 - 8.0   POC PROTEIN,UA negative negative, trace   Urobilinogen, UA 0.2 0.2 or 1.0 E.U./dL   Nitrite, UA Negative Negative   Leukocytes, UA Negative Negative     Physical Exam Constitutional:      Appearance: Normal appearance. She is obese.  HENT:     Head: Normocephalic.  Cardiovascular:     Rate and Rhythm: Normal rate and regular rhythm.  Pulmonary:     Effort: Pulmonary effort is normal.  Abdominal:     General: Bowel sounds are normal. There is no distension.     Palpations: Abdomen is soft. There is no mass.     Tenderness: There is no abdominal tenderness. There is no right CVA tenderness, left CVA tenderness, guarding or rebound.     Hernia: No hernia is present.  Musculoskeletal:     Right lower leg: No edema.     Left lower leg: No edema.  Neurological:     General: No focal deficit present.     Mental Status: She is alert and oriented to person, place, and time.  Psychiatric:        Mood and Affect: Mood normal.       The 10-year ASCVD risk score (Arnett DK, et al., 2019) is: 8.6%    Assessment & Plan:  SABRASABRAAvanna was seen today for groin pain.  Diagnoses and all orders for this visit:  Right lower quadrant abdominal pain -     US  PELVIC COMPLETE W TRANSVAGINAL AND TORSION R/O; Future -     CMP14+EGFR -     CA 125 -     CBC w/Diff/Platelet -     Urine Culture -     POCT URINALYSIS DIP (CLINITEK)  Family history of ovarian cancer -     CMP14+EGFR -     CA 125 -     CBC w/Diff/Platelet  Hematuria, unspecified type -     CMP14+EGFR -     CA 125 -     CBC w/Diff/Platelet  Elevated blood pressure reading   UA shows trace blood Will culture No red flag symptoms or physical exam findings Will get pelvic ultrasound stat to rule out torsion or masses Concerned with family hx of ovarian cancer No physical exam signs of hip flexor strain CA125, CBC, CMP ordered today BP elevated-monitor at home and bring in bP readings in 4 weeks Continue to  use tylenol  or motrin  for pain relief Consider heating pad at bedtime Will consider CT scan if symptoms resolved.  Will repeat UA in 4 weeks if no reason for hematuria   Vermell Bologna, PA-C

## 2024-01-13 NOTE — Progress Notes (Signed)
 Ultrasound with no acute findings to support pain. Very small left ovarian cyst but not where you pain is. Lets wait for labs and urine culture.

## 2024-01-13 NOTE — Patient Instructions (Signed)
 Get pelvic ultrasound Get labs today Make sure stools are moving well consider miralax or colace to help with BMs

## 2024-01-14 ENCOUNTER — Encounter: Payer: Self-pay | Admitting: Physician Assistant

## 2024-01-14 DIAGNOSIS — R1031 Right lower quadrant pain: Secondary | ICD-10-CM

## 2024-01-14 LAB — CMP14+EGFR
ALT: 21 [IU]/L (ref 0–32)
AST: 21 [IU]/L (ref 0–40)
Albumin: 4.5 g/dL (ref 3.9–4.9)
Alkaline Phosphatase: 78 [IU]/L (ref 44–121)
BUN/Creatinine Ratio: 24 (ref 12–28)
BUN: 16 mg/dL (ref 8–27)
Bilirubin Total: 0.3 mg/dL (ref 0.0–1.2)
CO2: 25 mmol/L (ref 20–29)
Calcium: 9.5 mg/dL (ref 8.7–10.3)
Chloride: 105 mmol/L (ref 96–106)
Creatinine, Ser: 0.66 mg/dL (ref 0.57–1.00)
Globulin, Total: 2 g/dL (ref 1.5–4.5)
Glucose: 85 mg/dL (ref 70–99)
Potassium: 4.3 mmol/L (ref 3.5–5.2)
Sodium: 142 mmol/L (ref 134–144)
Total Protein: 6.5 g/dL (ref 6.0–8.5)
eGFR: 96 mL/min/{1.73_m2} (ref >59)

## 2024-01-14 LAB — CBC WITH DIFFERENTIAL/PLATELET
Basophils Absolute: 0 10*3/uL (ref 0.0–0.2)
Basos: 1 %
EOS (ABSOLUTE): 0.1 10*3/uL (ref 0.0–0.4)
Eos: 2 %
Hematocrit: 42.2 % (ref 34.0–46.6)
Hemoglobin: 13.7 g/dL (ref 11.1–15.9)
Immature Grans (Abs): 0 10*3/uL (ref 0.0–0.1)
Immature Granulocytes: 0 %
Lymphocytes Absolute: 1.9 10*3/uL (ref 0.7–3.1)
Lymphs: 28 %
MCH: 29.2 pg (ref 26.6–33.0)
MCHC: 32.5 g/dL (ref 31.5–35.7)
MCV: 90 fL (ref 79–97)
Monocytes Absolute: 0.5 10*3/uL (ref 0.1–0.9)
Monocytes: 7 %
Neutrophils Absolute: 4.2 10*3/uL (ref 1.4–7.0)
Neutrophils: 62 %
Platelets: 209 10*3/uL (ref 150–450)
RBC: 4.69 x10E6/uL (ref 3.77–5.28)
RDW: 12.5 % (ref 11.7–15.4)
WBC: 6.7 10*3/uL (ref 3.4–10.8)

## 2024-01-14 LAB — CA 125: Cancer Antigen (CA) 125: 9.8 U/mL (ref 0.0–38.1)

## 2024-01-14 NOTE — Progress Notes (Signed)
 Rebekah [name],   Kidney, liver, glucose look good.  Your CA 125 is normal which is great news, this is a cancer antigen.  Your hemoglobin looks great.  Your WBC looks wonderful. No signs of infection.

## 2024-01-15 ENCOUNTER — Ambulatory Visit: Payer: Medicare Other | Admitting: Physician Assistant

## 2024-01-15 LAB — URINE CULTURE: Organism ID, Bacteria: NO GROWTH

## 2024-01-15 NOTE — Progress Notes (Signed)
 Urine culture showed no infection. Nothing to explain trace blood. CT has been ordered. Repeat UA in next week to see if trace blood has cleared.

## 2024-01-20 ENCOUNTER — Ambulatory Visit: Payer: Medicare Other

## 2024-01-20 ENCOUNTER — Encounter: Payer: Self-pay | Admitting: Physician Assistant

## 2024-01-20 ENCOUNTER — Ambulatory Visit (INDEPENDENT_AMBULATORY_CARE_PROVIDER_SITE_OTHER): Payer: Medicare Other

## 2024-01-20 DIAGNOSIS — R1031 Right lower quadrant pain: Secondary | ICD-10-CM | POA: Diagnosis not present

## 2024-01-20 DIAGNOSIS — R319 Hematuria, unspecified: Secondary | ICD-10-CM

## 2024-01-20 DIAGNOSIS — K5750 Diverticulosis of both small and large intestine without perforation or abscess without bleeding: Secondary | ICD-10-CM | POA: Diagnosis not present

## 2024-01-20 DIAGNOSIS — R109 Unspecified abdominal pain: Secondary | ICD-10-CM | POA: Diagnosis not present

## 2024-01-20 DIAGNOSIS — G4733 Obstructive sleep apnea (adult) (pediatric): Secondary | ICD-10-CM

## 2024-01-20 DIAGNOSIS — E6609 Other obesity due to excess calories: Secondary | ICD-10-CM

## 2024-01-20 DIAGNOSIS — E66811 Obesity, class 1: Secondary | ICD-10-CM

## 2024-01-20 LAB — POCT URINALYSIS DIP (CLINITEK)
Bilirubin, UA: NEGATIVE
Glucose, UA: NEGATIVE mg/dL
Ketones, POC UA: NEGATIVE mg/dL
Leukocytes, UA: NEGATIVE
Nitrite, UA: NEGATIVE
POC PROTEIN,UA: NEGATIVE
Spec Grav, UA: <=1.005 — AB (ref 1.010–1.025)
Urobilinogen, UA: 0.2 U/dL
pH, UA: 6 (ref 5.0–8.0)

## 2024-01-20 MED ORDER — IOHEXOL 300 MG/ML  SOLN
100.0000 mL | Freq: Once | INTRAMUSCULAR | Status: AC | PRN
  Administered 2024-01-20: 100 mL via INTRAVENOUS

## 2024-01-20 NOTE — Progress Notes (Signed)
Continues to have blood in urine. Let's get results of CT first and then determine where to go from there.

## 2024-01-20 NOTE — Progress Notes (Signed)
   Established Patient Office Visit  Subjective   Patient ID: Rebekah [name], female    DOB: December 21, 1955  Age: 69 y.o. MRN: [id number]  Chief Complaint  Patient presents with   Labs Only    HPI  Rebekah [name] is here for urinalysis for recheck for blood in urine.   ROS    Objective:     LMP 12/31/2009 (Approximate)    Physical Exam   Results for orders placed or performed in visit on 01/20/24  POCT URINALYSIS DIP (CLINITEK)  Result Value Ref Range   Color, UA yellow yellow   Clarity, UA clear clear   Glucose, UA negative negative mg/dL   Bilirubin, UA negative negative   Ketones, POC UA negative negative mg/dL   Spec Grav, UA <=4.696 (A) 1.010 - 1.025   Blood, UA small (A) negative   pH, UA 6.0 5.0 - 8.0   POC PROTEIN,UA negative negative, trace   Urobilinogen, UA 0.2 0.2 or 1.0 E.U./dL   Nitrite, UA Negative Negative   Leukocytes, UA Negative Negative      The 10-year ASCVD risk score (Arnett DK, et al., 2019) is: 8.6%    Assessment & Plan:  UA - positive blood in urine.   Problem List Items Addressed This Visit       Unprioritized   Hematuria - Primary   Relevant Orders   POCT URINALYSIS DIP (CLINITEK) (Completed)    No follow-ups on file.    Esmond Harps, CMA

## 2024-01-21 ENCOUNTER — Encounter: Payer: Self-pay | Admitting: Physician Assistant

## 2024-01-21 DIAGNOSIS — R319 Hematuria, unspecified: Secondary | ICD-10-CM

## 2024-01-21 MED ORDER — WEGOVY 0.25 MG/0.5ML ~~LOC~~ SOAJ: 0.2500 mg | SUBCUTANEOUS | 0 refills | Status: DC

## 2024-01-21 NOTE — Progress Notes (Signed)
No abnormal abdominal process. I will make referral to urology for hematuria for work up. Any preferences?

## 2024-01-24 MED ORDER — TOPIRAMATE 25 MG PO TABS: ORAL_TABLET | ORAL | 0 refills | Status: DC

## 2024-01-24 NOTE — Addendum Note (Signed)
Addended by: Jomarie Longs on: 01/24/2024 04:15 PM   Modules accepted: Orders

## 2024-01-28 DIAGNOSIS — R319 Hematuria, unspecified: Secondary | ICD-10-CM | POA: Diagnosis not present

## 2024-02-01 DIAGNOSIS — R319 Hematuria, unspecified: Secondary | ICD-10-CM | POA: Diagnosis not present

## 2024-02-01 DIAGNOSIS — N200 Calculus of kidney: Secondary | ICD-10-CM | POA: Diagnosis not present

## 2024-02-01 DIAGNOSIS — I7090 Unspecified atherosclerosis: Secondary | ICD-10-CM | POA: Diagnosis not present

## 2024-02-13 DIAGNOSIS — J385 Laryngeal spasm: Secondary | ICD-10-CM | POA: Diagnosis not present

## 2024-02-13 DIAGNOSIS — J383 Other diseases of vocal cords: Secondary | ICD-10-CM | POA: Diagnosis not present

## 2024-02-13 DIAGNOSIS — R498 Other voice and resonance disorders: Secondary | ICD-10-CM | POA: Diagnosis not present

## 2024-02-13 DIAGNOSIS — R490 Dysphonia: Secondary | ICD-10-CM | POA: Diagnosis not present

## 2024-02-13 DIAGNOSIS — J384 Edema of larynx: Secondary | ICD-10-CM | POA: Diagnosis not present

## 2024-02-14 ENCOUNTER — Other Ambulatory Visit: Payer: Self-pay | Admitting: Physician Assistant

## 2024-02-14 DIAGNOSIS — E7800 Pure hypercholesterolemia, unspecified: Secondary | ICD-10-CM

## 2024-02-24 DIAGNOSIS — L738 Other specified follicular disorders: Secondary | ICD-10-CM | POA: Diagnosis not present

## 2024-02-24 DIAGNOSIS — L814 Other melanin hyperpigmentation: Secondary | ICD-10-CM | POA: Diagnosis not present

## 2024-02-24 DIAGNOSIS — L820 Inflamed seborrheic keratosis: Secondary | ICD-10-CM | POA: Diagnosis not present

## 2024-02-24 DIAGNOSIS — D1801 Hemangioma of skin and subcutaneous tissue: Secondary | ICD-10-CM | POA: Diagnosis not present

## 2024-02-24 DIAGNOSIS — L570 Actinic keratosis: Secondary | ICD-10-CM | POA: Diagnosis not present

## 2024-02-24 DIAGNOSIS — Z85828 Personal history of other malignant neoplasm of skin: Secondary | ICD-10-CM | POA: Diagnosis not present

## 2024-02-24 DIAGNOSIS — L813 Cafe au lait spots: Secondary | ICD-10-CM | POA: Diagnosis not present

## 2024-02-24 DIAGNOSIS — L821 Other seborrheic keratosis: Secondary | ICD-10-CM | POA: Diagnosis not present

## 2024-02-29 ENCOUNTER — Telehealth: Payer: Self-pay

## 2024-02-29 NOTE — Telephone Encounter (Signed)
 Denial received for [facility]- plan exclusion  Rebekah [name] (Key: [id number]) - [id number]  UJWJXB 0.25MG /0.5ML auto-injectors  status: PA Response - DeniedCreated: January 21st, 2025 [phone]: January 28th, 2025   Message from Plan  Request Reference Number: [id number]. WEGOVY INJ 0.25MG  is denied due to Plan Exclusion. For further questions, call [phone].

## 2024-03-04 DIAGNOSIS — R498 Other voice and resonance disorders: Secondary | ICD-10-CM | POA: Diagnosis not present

## 2024-03-04 DIAGNOSIS — J385 Laryngeal spasm: Secondary | ICD-10-CM | POA: Diagnosis not present

## 2024-03-04 DIAGNOSIS — J384 Edema of larynx: Secondary | ICD-10-CM | POA: Diagnosis not present

## 2024-03-04 DIAGNOSIS — J383 Other diseases of vocal cords: Secondary | ICD-10-CM | POA: Diagnosis not present

## 2024-03-04 DIAGNOSIS — R490 Dysphonia: Secondary | ICD-10-CM | POA: Diagnosis not present

## 2024-03-09 DIAGNOSIS — M5411 Radiculopathy, occipito-atlanto-axial region: Secondary | ICD-10-CM | POA: Diagnosis not present

## 2024-03-09 DIAGNOSIS — M9901 Segmental and somatic dysfunction of cervical region: Secondary | ICD-10-CM | POA: Diagnosis not present

## 2024-03-10 DIAGNOSIS — M4726 Other spondylosis with radiculopathy, lumbar region: Secondary | ICD-10-CM | POA: Diagnosis not present

## 2024-03-10 DIAGNOSIS — M9902 Segmental and somatic dysfunction of thoracic region: Secondary | ICD-10-CM | POA: Diagnosis not present

## 2024-03-10 DIAGNOSIS — M4724 Other spondylosis with radiculopathy, thoracic region: Secondary | ICD-10-CM | POA: Diagnosis not present

## 2024-03-10 DIAGNOSIS — M9908 Segmental and somatic dysfunction of rib cage: Secondary | ICD-10-CM | POA: Diagnosis not present

## 2024-03-10 DIAGNOSIS — M9901 Segmental and somatic dysfunction of cervical region: Secondary | ICD-10-CM | POA: Diagnosis not present

## 2024-03-10 DIAGNOSIS — M9903 Segmental and somatic dysfunction of lumbar region: Secondary | ICD-10-CM | POA: Diagnosis not present

## 2024-03-10 DIAGNOSIS — M9904 Segmental and somatic dysfunction of sacral region: Secondary | ICD-10-CM | POA: Diagnosis not present

## 2024-03-10 DIAGNOSIS — S2341XA Sprain of ribs, initial encounter: Secondary | ICD-10-CM | POA: Diagnosis not present

## 2024-03-10 DIAGNOSIS — M4723 Other spondylosis with radiculopathy, cervicothoracic region: Secondary | ICD-10-CM | POA: Diagnosis not present

## 2024-03-10 DIAGNOSIS — M4728 Other spondylosis with radiculopathy, sacral and sacrococcygeal region: Secondary | ICD-10-CM | POA: Diagnosis not present

## 2024-03-11 DIAGNOSIS — M9904 Segmental and somatic dysfunction of sacral region: Secondary | ICD-10-CM | POA: Diagnosis not present

## 2024-03-11 DIAGNOSIS — M4728 Other spondylosis with radiculopathy, sacral and sacrococcygeal region: Secondary | ICD-10-CM | POA: Diagnosis not present

## 2024-03-11 DIAGNOSIS — M4724 Other spondylosis with radiculopathy, thoracic region: Secondary | ICD-10-CM | POA: Diagnosis not present

## 2024-03-11 DIAGNOSIS — M9908 Segmental and somatic dysfunction of rib cage: Secondary | ICD-10-CM | POA: Diagnosis not present

## 2024-03-11 DIAGNOSIS — S2341XA Sprain of ribs, initial encounter: Secondary | ICD-10-CM | POA: Diagnosis not present

## 2024-03-11 DIAGNOSIS — M4726 Other spondylosis with radiculopathy, lumbar region: Secondary | ICD-10-CM | POA: Diagnosis not present

## 2024-03-11 DIAGNOSIS — M4723 Other spondylosis with radiculopathy, cervicothoracic region: Secondary | ICD-10-CM | POA: Diagnosis not present

## 2024-03-11 DIAGNOSIS — M9903 Segmental and somatic dysfunction of lumbar region: Secondary | ICD-10-CM | POA: Diagnosis not present

## 2024-03-11 DIAGNOSIS — M9901 Segmental and somatic dysfunction of cervical region: Secondary | ICD-10-CM | POA: Diagnosis not present

## 2024-03-11 DIAGNOSIS — M9902 Segmental and somatic dysfunction of thoracic region: Secondary | ICD-10-CM | POA: Diagnosis not present

## 2024-03-12 ENCOUNTER — Ambulatory Visit
Admission: RE | Admit: 2024-03-12 | Discharge: 2024-03-12 | Disposition: A | Discharge status: Home / Self Care | Source: Ambulatory Visit | Attending: Family Medicine | Admitting: Family Medicine

## 2024-03-12 ENCOUNTER — Ambulatory Visit

## 2024-03-12 DIAGNOSIS — R6889 Other general symptoms and signs: Secondary | ICD-10-CM

## 2024-03-12 DIAGNOSIS — R509 Fever, unspecified: Secondary | ICD-10-CM | POA: Diagnosis not present

## 2024-03-12 DIAGNOSIS — R059 Cough, unspecified: Secondary | ICD-10-CM

## 2024-03-12 DIAGNOSIS — U071 COVID-19: Secondary | ICD-10-CM

## 2024-03-12 LAB — POCT INFLUENZA A/B
Influenza A, POC: NEGATIVE
Influenza B, POC: NEGATIVE

## 2024-03-12 LAB — POC SARS CORONAVIRUS 2 AG -  ED: SARS Coronavirus 2 Ag: POSITIVE — AB

## 2024-03-12 MED ORDER — HYDROCODONE BIT-HOMATROP MBR 5-1.5 MG/5ML PO SOLN: 5.0000 mL | Freq: Four times a day (QID) | ORAL | 0 refills | Status: DC | PRN

## 2024-03-12 MED ORDER — PAXLOVID (300/100) 20 X 150 MG & 10 X 100MG PO TBPK: 3.0000 | ORAL_TABLET | Freq: Two times a day (BID) | ORAL | 0 refills | Status: AC

## 2024-03-12 MED ORDER — BENZONATATE 200 MG PO CAPS: 200.0000 mg | ORAL_CAPSULE | Freq: Three times a day (TID) | ORAL | 0 refills | Status: AC | PRN

## 2024-03-12 MED ORDER — PREDNISONE 20 MG PO TABS: ORAL_TABLET | ORAL | 0 refills | Status: DC

## 2024-03-12 NOTE — ED Provider Notes (Signed)
 [name]    CSN: [id number] Arrival date & time: 03/12/24  0803      History   Chief Complaint Chief Complaint  Patient presents with   Cough   Fever    HPI Rebekah [name] is a 69 y.o. female.   HPI Pleasant 69 year old female presents with cough and fever for 1 day.  PMH significant for vocal fold edema/atrophy, SCC, and HLD.  Past Medical History:  Diagnosis Date   Allergy    --takes allergy injections   Family history of bladder cancer    Family history of breast cancer    Family history of ovarian cancer    History of COVID-19 05/31/2021   Hyperlipidemia    Osteopenia 2020   Perimenopause     Patient Active Problem List   Diagnosis Date Noted   Right lower quadrant abdominal pain 01/13/2024   Elevated blood pressure reading 01/13/2024   Community acquired pneumonia of right lower lobe of lung 10/29/2023   Low energy 07/15/2023   Vertigo 07/15/2023   Family history of breast cyst 04/12/2023   Mass of lower outer quadrant of right breast 04/12/2023   Breast pain, right 04/12/2023   Skin ulcer, limited to breakdown of skin (HCC) 03/25/2023   Burping 03/19/2022   Bilateral foot pain 03/19/2022   Indigestion 03/19/2022   Chronic bilateral low back pain without sciatica 11/06/2021   Primary insomnia 11/06/2021   DDD (degenerative disc disease), cervical 05/08/2021   DDD (degenerative disc disease), lumbar 05/08/2021   Genetic testing 02/13/2021   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   Family history of ovarian cancer    Family history of breast cancer    Family history of bladder cancer    Adjustment disorder with mixed anxiety and depressed mood 02/26/2020   Allergy to mold 01/22/2020   Stress due to family tension 11/09/2019   OSA (obstructive sleep apnea) 11/09/2019   Epigastric pain 01/28/2019   Gastroesophageal reflux disease with esophagitis without hemorrhage 01/28/2019   Newly recognized heart murmur 01/28/2019   Bradycardia  01/28/2019   Hematuria 05/27/2018   Overweight (BMI 25.0-29.9) 05/27/2018   Rhinitis, allergic 06/11/2016   Class 1 obesity due to excess calories without serious comorbidity with body mass index (BMI) of 32.0 to 32.9 in adult 01/11/2016   Squamous cell carcinoma 09/21/2014   Chronic allergic rhinitis 05/30/2014   Elevated LDL cholesterol level 04/14/2014   Allergic rhinitis 04/14/2014   Seasonal allergies 04/14/2014   OAB (overactive bladder) 04/14/2014    Past Surgical History:  Procedure Laterality Date   BREAST BIOPSY     DILATION AND CURETTAGE OF UTERUS  10/31/2010   Hysteroscopy with dilation and curettage - Dr. Edward Jolly - [facility]   KNEE SURGERY Bilateral    REDUCTION MAMMAPLASTY Bilateral    REFRACTIVE SURGERY Bilateral     OB History     Gravida  3   Para  2   Term  2   Preterm      AB  1   Living  1      SAB      IAB      Ectopic      Multiple      Live Births               Home Medications    Prior to Admission medications   Medication Sig Start Date End Date Taking? Authorizing Provider  benzonatate (TESSALON) 200 MG capsule Take 1 capsule (200 mg total) by mouth 3 (  three) times daily as needed for up to 7 days. 03/12/24 03/19/24 Yes Trevor Iha, FNP  HYDROcodone bit-homatropine (HYCODAN) 5-1.5 MG/5ML syrup Take 5 mLs by mouth every 6 (six) hours as needed for cough. 03/12/24  Yes Trevor Iha, FNP  nirmatrelvir/ritonavir (PAXLOVID, 300/100,) 20 x 150 MG & 10 x 100MG  TBPK Take 3 tablets by mouth 2 (two) times daily for 5 days. Patient GFR is >60. Take nirmatrelvir (150 mg) two tablets twice daily for 5 days and ritonavir (100 mg) one tablet twice daily for 5 days. 03/12/24 03/17/24 Yes Trevor Iha, FNP  predniSONE (DELTASONE) 20 MG tablet Take 3 tabs PO daily x 5 days. 03/12/24  Yes Trevor Iha, FNP  AMBULATORY NON FORMULARY MEDICATION Medication Name:  Nutrifii Magnical-d    [provider]  AMBULATORY NON FORMULARY  MEDICATION Medication Name:  Nutrifii rejuveniix    [provider]  AMBULATORY NON FORMULARY MEDICATION Medication Name:  Optimal-m    [provider]  AMBULATORY NON FORMULARY MEDICATION Medication Name:  Optimal-v    [provider]  fluticasone (FLONASE) 50 MCG/ACT nasal spray     [provider]  levocetirizine (XYZAL) 5 MG tablet TAKE ONE TABLET BY MOUTH EVERY EVENING 10/23/23   Breeback, Jade L, PA-C  lovastatin (MEVACOR) 40 MG tablet TAKE 1 TABLET BY MOUTH AT BEDTIME 02/17/24   Breeback, Jade L, PA-C  topiramate (TOPAMAX) 25 MG tablet Take one tablet at bedtime for 7 days and then increase to twice daily. 01/24/24   Breeback, Jade L, PA-C  traZODone (DESYREL) 50 MG tablet TAKE 1/2 TO 1 TABLET BY MOUTH EVERY NIGHT AT BEDTIME 06/11/23   Breeback, Lonna Cobb, PA-C  UNABLE TO FIND Med Name: Allergy Shots    [provider]  WEGOVY 0.25 MG/0.5ML SOAJ Inject 0.25 mg into the skin once a week. Use this dose for 1 month (4 shots) and then increase to next higher dose. 01/21/24   Breeback, Lonna Cobb, PA-C    Family History Family History  Problem Relation Age of Onset   Hypertension Mother    Stroke Mother    Cancer Mother 28       ovarian ca, hx of TTP   Hyperlipidemia Mother    Ovarian cancer Mother    Hypertension Father    Cancer Father 54       bladder ca   Hyperlipidemia Father    Dementia Father        lewy body dementia   Anemia Sister    Mental illness Sister        mentally disabled--lives in group home in [place]   Diabetes Brother    Multiple myeloma Brother    Asthma Brother        allergy induced   Ovarian cancer Maternal Grandmother    Breast cancer Cousin        dx 41s    Social History Social History   Tobacco Use   Smoking status: Never   Smokeless tobacco: Never  Vaping Use   Vaping status: Never Used  Substance Use Topics   Alcohol use: Yes    Alcohol/week: 1.0 standard drink of alcohol    Types: 1 Glasses of  wine per week   Drug use: Never     Allergies   Gramineae pollens, Molds & smuts, Tree extract, and Dust mite extract   Review of Systems Review of Systems  Constitutional:  Positive for fever.  Respiratory:  Positive for cough.   All other systems  reviewed and are negative.    Physical Exam Triage Vital Signs ED Triage Vitals  Encounter Vitals Group     BP --      Systolic BP Percentile --      Diastolic BP Percentile --      Pulse --      Resp --      Temp --      Temp Source 03/12/24 0822 Oral     SpO2 --      Weight --      Height --      Head Circumference --      Peak Flow --      Pain Score 03/12/24 0823 0     Pain Loc --      Pain Education --      Exclude from Growth Chart --    No data found.  Updated Vital Signs LMP 12/31/2009 (Approximate)    Physical Exam Vitals and nursing note reviewed.  Constitutional:      Appearance: Normal appearance. She is normal weight. She is ill-appearing.  HENT:     Head: Normocephalic and atraumatic.     Right Ear: Tympanic membrane, ear canal and external ear normal.     Left Ear: Tympanic membrane, ear canal and external ear normal.     Mouth/Throat:     Mouth: Mucous membranes are moist.     Pharynx: Oropharynx is clear.  Eyes:     Extraocular Movements: Extraocular movements intact.     Conjunctiva/sclera: Conjunctivae normal.     Pupils: Pupils are equal, round, and reactive to light.  Cardiovascular:     Rate and Rhythm: Normal rate and regular rhythm.     Pulses: Normal pulses.     Heart sounds: Normal heart sounds.  Pulmonary:     Effort: Pulmonary effort is normal.     Breath sounds: Normal breath sounds. No wheezing, rhonchi or rales.     Comments: Infrequent nonproductive cough on exam Musculoskeletal:        General: Normal range of motion.     Cervical back: Normal range of motion and neck supple.  Skin:    General: Skin is warm and dry.  Neurological:     General: No focal deficit  present.     Mental Status: She is alert and oriented to person, place, and time. Mental status is at baseline.  Psychiatric:        Mood and Affect: Mood normal.        Behavior: Behavior normal.      UC Treatments / Results  Labs (all labs ordered are listed, but only abnormal results are displayed) Labs Reviewed  POC SARS CORONAVIRUS 2 AG -  ED - Abnormal; Notable for the following components:      Result Value   SARS Coronavirus 2 Ag Positive (*)    All other components within normal limits  POCT INFLUENZA A/B    EKG   Radiology No results found.  Procedures Procedures (including critical care time)  Medications Ordered in UC Medications - No data to display  Initial Impression / Assessment and Plan / UC Course  I have reviewed the triage vital signs and the nursing notes.  Pertinent labs & imaging results that were available during my care of the patient were reviewed by me and considered in my medical decision making (see chart for details).     MDM: 1.  COVID-19-Rx'd Paxlovid: Take 3 tabs 2 times daily x 5  days; 2.  Cough, unspecified type-Rx'd prednisone 20 mg tablet: Take 3 tablets p.o. daily x 5 days, Rx'd Tessalon 200 mg capsule: Take 1 capsule 3 times daily, as needed for cough, Rx'd Hycodan 5-1.5 mg / 5 mL syrup: Take 5 mL every 6 hours as needed for cough; 3.  Fever, unspecified-Advised patient may take OTC Tylenol 1 g every 6 hours for fever (oral temperature greater than 100.3). Advised patient to take medications as directed with food to completion.  Advised patient to take prednisone with first dose of Paxlovid for the next 5 days.  Advised may use Tessalon capsules daily or as needed for cough.  Advised may use Hycodan cough syrup at night prior to sleep for cough due to sedative effects.  Advised patient may take OTC Tylenol 1 g every 6 hours for fever (oral temperature greater than 100.3).  Encouraged to increase daily water intake to 64 ounces per day  while taking these medications.  Advised if symptoms worsen and/or unresolved please follow-up with your PCP or here for further evaluation.  Work note provided to patient prior to discharge.  Patient discharged home, hemodynamically stable. Final Clinical Impressions(s) / UC Diagnoses   Final diagnoses:  Fever, unspecified  COVID-19  Cough, unspecified type     Discharge Instructions      Advised patient to take medications as directed with food to completion.  Advised patient to take prednisone with first dose of Paxlovid for the next 5 days.  Advised may use Tessalon capsules daily or as needed for cough.  Advised may use Hycodan cough syrup at night prior to sleep for cough due to sedative effects.  Advised patient may take OTC Tylenol 1 g every 6 hours for fever (oral temperature greater than 100.3).  Encouraged to increase daily water intake to 64 ounces per day while taking these medications.  Advised if symptoms worsen and/or unresolved please follow-up with your PCP or here for further evaluation.     ED Prescriptions     Medication Sig Dispense Auth. Provider   nirmatrelvir/ritonavir (PAXLOVID, 300/100,) 20 x 150 MG & 10 x 100MG  TBPK Take 3 tablets by mouth 2 (two) times daily for 5 days. Patient GFR is >60. Take nirmatrelvir (150 mg) two tablets twice daily for 5 days and ritonavir (100 mg) one tablet twice daily for 5 days. 30 tablet Trevor Iha, FNP   predniSONE (DELTASONE) 20 MG tablet Take 3 tabs PO daily x 5 days. 15 tablet Trevor Iha, FNP   benzonatate (TESSALON) 200 MG capsule Take 1 capsule (200 mg total) by mouth 3 (three) times daily as needed for up to 7 days. 40 capsule Trevor Iha, FNP   HYDROcodone bit-homatropine (HYCODAN) 5-1.5 MG/5ML syrup Take 5 mLs by mouth every 6 (six) hours as needed for cough. 120 mL Trevor Iha, FNP      I have reviewed the PDMP during this encounter.   Trevor Iha, FNP 03/12/24 [phone]

## 2024-03-12 NOTE — ED Triage Notes (Signed)
 Pt c/o cough and fever since yesterday. Tmax fever of 101.3 yesterday afternoon. Taking Alkaseltzer prn.

## 2024-03-12 NOTE — Discharge Instructions (Addendum)
 Advised patient to take medications as directed with food to completion.  Advised patient to take prednisone with first dose of Paxlovid for the next 5 days.  Advised may use Tessalon capsules daily or as needed for cough.  Advised may use Hycodan cough syrup at night prior to sleep for cough due to sedative effects.  Advised patient may take OTC Tylenol 1 g every 6 hours for fever (oral temperature greater than 100.3).  Encouraged to increase daily water intake to 64 ounces per day while taking these medications.  Advised if symptoms worsen and/or unresolved please follow-up with your PCP or here for further evaluation.

## 2024-03-13 ENCOUNTER — Encounter: Payer: Self-pay | Admitting: Physician Assistant

## 2024-03-18 DIAGNOSIS — M9901 Segmental and somatic dysfunction of cervical region: Secondary | ICD-10-CM | POA: Diagnosis not present

## 2024-03-18 DIAGNOSIS — M9903 Segmental and somatic dysfunction of lumbar region: Secondary | ICD-10-CM | POA: Diagnosis not present

## 2024-03-18 DIAGNOSIS — M9904 Segmental and somatic dysfunction of sacral region: Secondary | ICD-10-CM | POA: Diagnosis not present

## 2024-03-18 DIAGNOSIS — S2341XA Sprain of ribs, initial encounter: Secondary | ICD-10-CM | POA: Diagnosis not present

## 2024-03-18 DIAGNOSIS — M4723 Other spondylosis with radiculopathy, cervicothoracic region: Secondary | ICD-10-CM | POA: Diagnosis not present

## 2024-03-18 DIAGNOSIS — M9908 Segmental and somatic dysfunction of rib cage: Secondary | ICD-10-CM | POA: Diagnosis not present

## 2024-03-18 DIAGNOSIS — M9902 Segmental and somatic dysfunction of thoracic region: Secondary | ICD-10-CM | POA: Diagnosis not present

## 2024-03-18 DIAGNOSIS — M4726 Other spondylosis with radiculopathy, lumbar region: Secondary | ICD-10-CM | POA: Diagnosis not present

## 2024-03-18 DIAGNOSIS — M4724 Other spondylosis with radiculopathy, thoracic region: Secondary | ICD-10-CM | POA: Diagnosis not present

## 2024-03-18 DIAGNOSIS — M4728 Other spondylosis with radiculopathy, sacral and sacrococcygeal region: Secondary | ICD-10-CM | POA: Diagnosis not present

## 2024-03-19 DIAGNOSIS — M9901 Segmental and somatic dysfunction of cervical region: Secondary | ICD-10-CM | POA: Diagnosis not present

## 2024-03-19 DIAGNOSIS — M4726 Other spondylosis with radiculopathy, lumbar region: Secondary | ICD-10-CM | POA: Diagnosis not present

## 2024-03-19 DIAGNOSIS — M9908 Segmental and somatic dysfunction of rib cage: Secondary | ICD-10-CM | POA: Diagnosis not present

## 2024-03-19 DIAGNOSIS — M4723 Other spondylosis with radiculopathy, cervicothoracic region: Secondary | ICD-10-CM | POA: Diagnosis not present

## 2024-03-19 DIAGNOSIS — M9902 Segmental and somatic dysfunction of thoracic region: Secondary | ICD-10-CM | POA: Diagnosis not present

## 2024-03-19 DIAGNOSIS — S2341XA Sprain of ribs, initial encounter: Secondary | ICD-10-CM | POA: Diagnosis not present

## 2024-03-19 DIAGNOSIS — M9904 Segmental and somatic dysfunction of sacral region: Secondary | ICD-10-CM | POA: Diagnosis not present

## 2024-03-19 DIAGNOSIS — M4724 Other spondylosis with radiculopathy, thoracic region: Secondary | ICD-10-CM | POA: Diagnosis not present

## 2024-03-19 DIAGNOSIS — M4728 Other spondylosis with radiculopathy, sacral and sacrococcygeal region: Secondary | ICD-10-CM | POA: Diagnosis not present

## 2024-03-19 DIAGNOSIS — M9903 Segmental and somatic dysfunction of lumbar region: Secondary | ICD-10-CM | POA: Diagnosis not present

## 2024-03-22 ENCOUNTER — Other Ambulatory Visit: Payer: Self-pay | Admitting: Physician Assistant

## 2024-03-22 DIAGNOSIS — E7800 Pure hypercholesterolemia, unspecified: Secondary | ICD-10-CM

## 2024-03-23 DIAGNOSIS — M9901 Segmental and somatic dysfunction of cervical region: Secondary | ICD-10-CM | POA: Diagnosis not present

## 2024-03-23 DIAGNOSIS — S2341XA Sprain of ribs, initial encounter: Secondary | ICD-10-CM | POA: Diagnosis not present

## 2024-03-23 DIAGNOSIS — M9904 Segmental and somatic dysfunction of sacral region: Secondary | ICD-10-CM | POA: Diagnosis not present

## 2024-03-23 DIAGNOSIS — M4724 Other spondylosis with radiculopathy, thoracic region: Secondary | ICD-10-CM | POA: Diagnosis not present

## 2024-03-23 DIAGNOSIS — M9902 Segmental and somatic dysfunction of thoracic region: Secondary | ICD-10-CM | POA: Diagnosis not present

## 2024-03-23 DIAGNOSIS — M4723 Other spondylosis with radiculopathy, cervicothoracic region: Secondary | ICD-10-CM | POA: Diagnosis not present

## 2024-03-23 DIAGNOSIS — M9903 Segmental and somatic dysfunction of lumbar region: Secondary | ICD-10-CM | POA: Diagnosis not present

## 2024-03-23 DIAGNOSIS — M9908 Segmental and somatic dysfunction of rib cage: Secondary | ICD-10-CM | POA: Diagnosis not present

## 2024-03-23 DIAGNOSIS — M4728 Other spondylosis with radiculopathy, sacral and sacrococcygeal region: Secondary | ICD-10-CM | POA: Diagnosis not present

## 2024-03-23 DIAGNOSIS — M4726 Other spondylosis with radiculopathy, lumbar region: Secondary | ICD-10-CM | POA: Diagnosis not present

## 2024-03-25 DIAGNOSIS — S2341XA Sprain of ribs, initial encounter: Secondary | ICD-10-CM | POA: Diagnosis not present

## 2024-03-25 DIAGNOSIS — M9901 Segmental and somatic dysfunction of cervical region: Secondary | ICD-10-CM | POA: Diagnosis not present

## 2024-03-25 DIAGNOSIS — M4723 Other spondylosis with radiculopathy, cervicothoracic region: Secondary | ICD-10-CM | POA: Diagnosis not present

## 2024-03-25 DIAGNOSIS — M9902 Segmental and somatic dysfunction of thoracic region: Secondary | ICD-10-CM | POA: Diagnosis not present

## 2024-03-25 DIAGNOSIS — M4728 Other spondylosis with radiculopathy, sacral and sacrococcygeal region: Secondary | ICD-10-CM | POA: Diagnosis not present

## 2024-03-25 DIAGNOSIS — M9908 Segmental and somatic dysfunction of rib cage: Secondary | ICD-10-CM | POA: Diagnosis not present

## 2024-03-25 DIAGNOSIS — M4724 Other spondylosis with radiculopathy, thoracic region: Secondary | ICD-10-CM | POA: Diagnosis not present

## 2024-03-25 DIAGNOSIS — M9904 Segmental and somatic dysfunction of sacral region: Secondary | ICD-10-CM | POA: Diagnosis not present

## 2024-03-25 DIAGNOSIS — M4726 Other spondylosis with radiculopathy, lumbar region: Secondary | ICD-10-CM | POA: Diagnosis not present

## 2024-03-25 DIAGNOSIS — M9903 Segmental and somatic dysfunction of lumbar region: Secondary | ICD-10-CM | POA: Diagnosis not present

## 2024-03-30 DIAGNOSIS — M4723 Other spondylosis with radiculopathy, cervicothoracic region: Secondary | ICD-10-CM | POA: Diagnosis not present

## 2024-03-30 DIAGNOSIS — M4724 Other spondylosis with radiculopathy, thoracic region: Secondary | ICD-10-CM | POA: Diagnosis not present

## 2024-03-30 DIAGNOSIS — R490 Dysphonia: Secondary | ICD-10-CM | POA: Diagnosis not present

## 2024-03-30 DIAGNOSIS — M9903 Segmental and somatic dysfunction of lumbar region: Secondary | ICD-10-CM | POA: Diagnosis not present

## 2024-03-30 DIAGNOSIS — J385 Laryngeal spasm: Secondary | ICD-10-CM | POA: Diagnosis not present

## 2024-03-30 DIAGNOSIS — M9908 Segmental and somatic dysfunction of rib cage: Secondary | ICD-10-CM | POA: Diagnosis not present

## 2024-03-30 DIAGNOSIS — J383 Other diseases of vocal cords: Secondary | ICD-10-CM | POA: Diagnosis not present

## 2024-03-30 DIAGNOSIS — S2341XA Sprain of ribs, initial encounter: Secondary | ICD-10-CM | POA: Diagnosis not present

## 2024-03-30 DIAGNOSIS — M9901 Segmental and somatic dysfunction of cervical region: Secondary | ICD-10-CM | POA: Diagnosis not present

## 2024-03-30 DIAGNOSIS — M9902 Segmental and somatic dysfunction of thoracic region: Secondary | ICD-10-CM | POA: Diagnosis not present

## 2024-03-30 DIAGNOSIS — J384 Edema of larynx: Secondary | ICD-10-CM | POA: Diagnosis not present

## 2024-03-30 DIAGNOSIS — R498 Other voice and resonance disorders: Secondary | ICD-10-CM | POA: Diagnosis not present

## 2024-03-30 DIAGNOSIS — M4726 Other spondylosis with radiculopathy, lumbar region: Secondary | ICD-10-CM | POA: Diagnosis not present

## 2024-03-30 DIAGNOSIS — M4728 Other spondylosis with radiculopathy, sacral and sacrococcygeal region: Secondary | ICD-10-CM | POA: Diagnosis not present

## 2024-03-30 DIAGNOSIS — M9904 Segmental and somatic dysfunction of sacral region: Secondary | ICD-10-CM | POA: Diagnosis not present

## 2024-04-09 DIAGNOSIS — S2341XA Sprain of ribs, initial encounter: Secondary | ICD-10-CM | POA: Diagnosis not present

## 2024-04-09 DIAGNOSIS — M4724 Other spondylosis with radiculopathy, thoracic region: Secondary | ICD-10-CM | POA: Diagnosis not present

## 2024-04-09 DIAGNOSIS — M4723 Other spondylosis with radiculopathy, cervicothoracic region: Secondary | ICD-10-CM | POA: Diagnosis not present

## 2024-04-09 DIAGNOSIS — M4726 Other spondylosis with radiculopathy, lumbar region: Secondary | ICD-10-CM | POA: Diagnosis not present

## 2024-04-09 DIAGNOSIS — M4728 Other spondylosis with radiculopathy, sacral and sacrococcygeal region: Secondary | ICD-10-CM | POA: Diagnosis not present

## 2024-04-09 DIAGNOSIS — M9901 Segmental and somatic dysfunction of cervical region: Secondary | ICD-10-CM | POA: Diagnosis not present

## 2024-04-09 DIAGNOSIS — R319 Hematuria, unspecified: Secondary | ICD-10-CM | POA: Diagnosis not present

## 2024-04-09 DIAGNOSIS — M9902 Segmental and somatic dysfunction of thoracic region: Secondary | ICD-10-CM | POA: Diagnosis not present

## 2024-04-09 DIAGNOSIS — M9904 Segmental and somatic dysfunction of sacral region: Secondary | ICD-10-CM | POA: Diagnosis not present

## 2024-04-09 DIAGNOSIS — M9908 Segmental and somatic dysfunction of rib cage: Secondary | ICD-10-CM | POA: Diagnosis not present

## 2024-04-09 DIAGNOSIS — M9903 Segmental and somatic dysfunction of lumbar region: Secondary | ICD-10-CM | POA: Diagnosis not present

## 2024-04-13 DIAGNOSIS — M9901 Segmental and somatic dysfunction of cervical region: Secondary | ICD-10-CM | POA: Diagnosis not present

## 2024-04-13 DIAGNOSIS — M9902 Segmental and somatic dysfunction of thoracic region: Secondary | ICD-10-CM | POA: Diagnosis not present

## 2024-04-13 DIAGNOSIS — S2341XA Sprain of ribs, initial encounter: Secondary | ICD-10-CM | POA: Diagnosis not present

## 2024-04-13 DIAGNOSIS — M9903 Segmental and somatic dysfunction of lumbar region: Secondary | ICD-10-CM | POA: Diagnosis not present

## 2024-04-13 DIAGNOSIS — M4723 Other spondylosis with radiculopathy, cervicothoracic region: Secondary | ICD-10-CM | POA: Diagnosis not present

## 2024-04-13 DIAGNOSIS — M9904 Segmental and somatic dysfunction of sacral region: Secondary | ICD-10-CM | POA: Diagnosis not present

## 2024-04-13 DIAGNOSIS — M4726 Other spondylosis with radiculopathy, lumbar region: Secondary | ICD-10-CM | POA: Diagnosis not present

## 2024-04-13 DIAGNOSIS — M4728 Other spondylosis with radiculopathy, sacral and sacrococcygeal region: Secondary | ICD-10-CM | POA: Diagnosis not present

## 2024-04-13 DIAGNOSIS — M4724 Other spondylosis with radiculopathy, thoracic region: Secondary | ICD-10-CM | POA: Diagnosis not present

## 2024-04-13 DIAGNOSIS — M9908 Segmental and somatic dysfunction of rib cage: Secondary | ICD-10-CM | POA: Diagnosis not present

## 2024-04-14 ENCOUNTER — Encounter: Payer: Self-pay | Admitting: Physician Assistant

## 2024-04-15 ENCOUNTER — Encounter: Payer: Self-pay | Admitting: Physician Assistant

## 2024-04-23 ENCOUNTER — Other Ambulatory Visit: Payer: Self-pay | Admitting: Physician Assistant

## 2024-04-24 NOTE — Telephone Encounter (Signed)
 Requesting rx rf of topirmate 25mg  tablet  Last written 01/24/2024 Last OV 01/13/2024 Upcoming appt = none

## 2024-04-27 DIAGNOSIS — M9902 Segmental and somatic dysfunction of thoracic region: Secondary | ICD-10-CM | POA: Diagnosis not present

## 2024-04-27 DIAGNOSIS — M9908 Segmental and somatic dysfunction of rib cage: Secondary | ICD-10-CM | POA: Diagnosis not present

## 2024-04-27 DIAGNOSIS — S2341XA Sprain of ribs, initial encounter: Secondary | ICD-10-CM | POA: Diagnosis not present

## 2024-04-27 DIAGNOSIS — M9903 Segmental and somatic dysfunction of lumbar region: Secondary | ICD-10-CM | POA: Diagnosis not present

## 2024-04-27 DIAGNOSIS — M4728 Other spondylosis with radiculopathy, sacral and sacrococcygeal region: Secondary | ICD-10-CM | POA: Diagnosis not present

## 2024-04-27 DIAGNOSIS — M4723 Other spondylosis with radiculopathy, cervicothoracic region: Secondary | ICD-10-CM | POA: Diagnosis not present

## 2024-04-27 DIAGNOSIS — M4726 Other spondylosis with radiculopathy, lumbar region: Secondary | ICD-10-CM | POA: Diagnosis not present

## 2024-04-27 DIAGNOSIS — M9901 Segmental and somatic dysfunction of cervical region: Secondary | ICD-10-CM | POA: Diagnosis not present

## 2024-04-27 DIAGNOSIS — M4724 Other spondylosis with radiculopathy, thoracic region: Secondary | ICD-10-CM | POA: Diagnosis not present

## 2024-04-27 DIAGNOSIS — M9904 Segmental and somatic dysfunction of sacral region: Secondary | ICD-10-CM | POA: Diagnosis not present

## 2024-04-28 ENCOUNTER — Other Ambulatory Visit: Payer: Self-pay

## 2024-04-28 DIAGNOSIS — E7800 Pure hypercholesterolemia, unspecified: Secondary | ICD-10-CM

## 2024-04-28 MED ORDER — LOVASTATIN 40 MG PO TABS: 40.0000 mg | ORAL_TABLET | Freq: Every day | ORAL | 0 refills | Status: DC

## 2024-05-05 DIAGNOSIS — M9904 Segmental and somatic dysfunction of sacral region: Secondary | ICD-10-CM | POA: Diagnosis not present

## 2024-05-05 DIAGNOSIS — M4726 Other spondylosis with radiculopathy, lumbar region: Secondary | ICD-10-CM | POA: Diagnosis not present

## 2024-05-05 DIAGNOSIS — M9908 Segmental and somatic dysfunction of rib cage: Secondary | ICD-10-CM | POA: Diagnosis not present

## 2024-05-05 DIAGNOSIS — M4724 Other spondylosis with radiculopathy, thoracic region: Secondary | ICD-10-CM | POA: Diagnosis not present

## 2024-05-05 DIAGNOSIS — M9902 Segmental and somatic dysfunction of thoracic region: Secondary | ICD-10-CM | POA: Diagnosis not present

## 2024-05-05 DIAGNOSIS — S2341XA Sprain of ribs, initial encounter: Secondary | ICD-10-CM | POA: Diagnosis not present

## 2024-05-05 DIAGNOSIS — M9901 Segmental and somatic dysfunction of cervical region: Secondary | ICD-10-CM | POA: Diagnosis not present

## 2024-05-05 DIAGNOSIS — M4723 Other spondylosis with radiculopathy, cervicothoracic region: Secondary | ICD-10-CM | POA: Diagnosis not present

## 2024-05-05 DIAGNOSIS — M4728 Other spondylosis with radiculopathy, sacral and sacrococcygeal region: Secondary | ICD-10-CM | POA: Diagnosis not present

## 2024-05-05 DIAGNOSIS — M9903 Segmental and somatic dysfunction of lumbar region: Secondary | ICD-10-CM | POA: Diagnosis not present

## 2024-05-13 ENCOUNTER — Encounter: Payer: Self-pay | Admitting: Physician Assistant

## 2024-06-17 ENCOUNTER — Other Ambulatory Visit: Payer: Self-pay | Admitting: Physician Assistant

## 2024-06-17 DIAGNOSIS — F5101 Primary insomnia: Secondary | ICD-10-CM

## 2024-06-18 ENCOUNTER — Encounter: Payer: Self-pay | Admitting: Physician Assistant

## 2024-06-18 DIAGNOSIS — E6609 Other obesity due to excess calories: Secondary | ICD-10-CM

## 2024-06-19 NOTE — Telephone Encounter (Signed)
 Ok for weight loss referral to [facility].

## 2024-06-19 NOTE — Telephone Encounter (Signed)
 Referral sent to [facility]

## 2024-06-22 NOTE — Telephone Encounter (Signed)
 Please advise on refill request

## 2024-07-08 NOTE — Telephone Encounter (Signed)
 I faxed it today to an updated number.

## 2024-07-08 NOTE — Telephone Encounter (Signed)
 Patient informed and will contact [facility] at number given.

## 2024-07-22 DIAGNOSIS — Z7689 Persons encountering health services in other specified circumstances: Secondary | ICD-10-CM | POA: Diagnosis not present

## 2024-07-22 DIAGNOSIS — Z7182 Exercise counseling: Secondary | ICD-10-CM | POA: Diagnosis not present

## 2024-07-22 DIAGNOSIS — Z8639 Personal history of other endocrine, nutritional and metabolic disease: Secondary | ICD-10-CM | POA: Diagnosis not present

## 2024-07-22 DIAGNOSIS — Z13228 Encounter for screening for other metabolic disorders: Secondary | ICD-10-CM | POA: Diagnosis not present

## 2024-07-22 DIAGNOSIS — G4733 Obstructive sleep apnea (adult) (pediatric): Secondary | ICD-10-CM | POA: Diagnosis not present

## 2024-07-22 DIAGNOSIS — Z713 Dietary counseling and surveillance: Secondary | ICD-10-CM | POA: Diagnosis not present

## 2024-07-27 ENCOUNTER — Other Ambulatory Visit: Payer: Self-pay | Admitting: Physician Assistant

## 2024-07-27 DIAGNOSIS — E7800 Pure hypercholesterolemia, unspecified: Secondary | ICD-10-CM

## 2024-08-12 DIAGNOSIS — R632 Polyphagia: Secondary | ICD-10-CM | POA: Diagnosis not present

## 2024-08-24 ENCOUNTER — Encounter: Payer: Self-pay | Admitting: Physician Assistant

## 2024-08-24 ENCOUNTER — Telehealth (INDEPENDENT_AMBULATORY_CARE_PROVIDER_SITE_OTHER): Admitting: Physician Assistant

## 2024-08-24 DIAGNOSIS — L237 Allergic contact dermatitis due to plants, except food: Secondary | ICD-10-CM | POA: Diagnosis not present

## 2024-08-24 MED ORDER — METHYLPREDNISOLONE 4 MG PO TBPK: ORAL_TABLET | ORAL | 0 refills | Status: DC

## 2024-08-24 MED ORDER — HYDROXYZINE PAMOATE 25 MG PO CAPS
25.0000 mg | ORAL_CAPSULE | Freq: Three times a day (TID) | ORAL | 0 refills | Status: DC | PRN
Start: 2024-08-24 — End: 2024-09-16

## 2024-08-24 MED ORDER — TRIAMCINOLONE ACETONIDE 0.1 % EX CREA: 1.0000 | TOPICAL_CREAM | Freq: Two times a day (BID) | CUTANEOUS | 0 refills | Status: DC

## 2024-08-24 NOTE — Telephone Encounter (Signed)
 Please schedule for this afternoon. Ok to double book.

## 2024-08-24 NOTE — Telephone Encounter (Signed)
 Can one of you schedule a virtual for this afternoon? Ok to double book

## 2024-08-24 NOTE — Telephone Encounter (Signed)
 Patient scheduled.

## 2024-08-25 ENCOUNTER — Other Ambulatory Visit: Payer: Self-pay

## 2024-08-25 ENCOUNTER — Encounter: Payer: Self-pay | Admitting: Physician Assistant

## 2024-08-25 NOTE — Progress Notes (Signed)
..  Virtual Visit via Video Note  I connected with Rebekah [name] on 08/24/2024 at  4:20 PM EDT by a video enabled telemedicine application and verified that I am speaking with the correct person using two identifiers.  Location: Patient: home Provider: clinic  .SABRASABRAParticipating in visit:  Patient: Rebekah [name] Provider: Vermell Bologna PA-C   I discussed the limitations of evaluation and management by telemedicine and the availability of in person appointments. The patient expressed understanding and agreed to proceed.  History of Present Illness: Pt is a 69 yo female who has hx of rash and itching with posion oak encounters. She was pulling up weeds Saturday and noticed some poison oak. She was wearing rubber gloves but apparently still came in contact with some of the oils. Rash started Sunday and continues to spread and get more itchy. She denies any SOB, tongue or lip swelling. Prednisone  has worked well for her in the past. She has been using OTC hydrocortisone and calamine but not helping much.    Observations/Objective: No acute distress Normal mood and appearance Normal breathing Vesicular erythematous rash of bilateral forearms   Assessment and Plan: SABRASABRADiagnoses and all orders for this visit:  Poison oak dermatitis -     methylPREDNISolone  (MEDROL  DOSEPAK) 4 MG TBPK tablet; Take as directed by package insert. -     triamcinolone  cream (KENALOG ) 0.1 %; Apply 1 Application topically 2 (two) times daily. -     hydrOXYzine  (VISTARIL ) 25 MG capsule; Take 1 capsule (25 mg total) by mouth every 8 (eight) hours as needed for itching.   Medrol  dose pack given Topical triamcinolone  for as needed itching and rash Hydroxyzine  to help itching and sleep Consider cool compresses that can be helpful, watch out for sleepiness when taking Follow up as needed   Follow Up Instructions:    I discussed the assessment and treatment plan with the patient. The patient was provided an opportunity to  ask questions and all were answered. The patient agreed with the plan and demonstrated an understanding of the instructions.   The patient was advised to call back or seek an in-person evaluation if the symptoms worsen or if the condition fails to improve as anticipated.   Jaylene Arrowood, PA-C

## 2024-08-26 ENCOUNTER — Ambulatory Visit: Admitting: Physician Assistant

## 2024-09-07 DIAGNOSIS — L814 Other melanin hyperpigmentation: Secondary | ICD-10-CM | POA: Diagnosis not present

## 2024-09-07 DIAGNOSIS — G4733 Obstructive sleep apnea (adult) (pediatric): Secondary | ICD-10-CM | POA: Diagnosis not present

## 2024-09-07 DIAGNOSIS — L820 Inflamed seborrheic keratosis: Secondary | ICD-10-CM | POA: Diagnosis not present

## 2024-09-08 ENCOUNTER — Ambulatory Visit (INDEPENDENT_AMBULATORY_CARE_PROVIDER_SITE_OTHER)

## 2024-09-08 VITALS — BP 122/51 | HR 54 | Ht 60.0 in | Wt 162.0 lb

## 2024-09-08 DIAGNOSIS — Z780 Asymptomatic menopausal state: Secondary | ICD-10-CM

## 2024-09-08 DIAGNOSIS — Z13820 Encounter for screening for osteoporosis: Secondary | ICD-10-CM

## 2024-09-08 DIAGNOSIS — Z0000 Encounter for general adult medical examination without abnormal findings: Secondary | ICD-10-CM

## 2024-09-08 DIAGNOSIS — Z23 Encounter for immunization: Secondary | ICD-10-CM | POA: Diagnosis not present

## 2024-09-08 DIAGNOSIS — Z1231 Encounter for screening mammogram for malignant neoplasm of breast: Secondary | ICD-10-CM | POA: Diagnosis not present

## 2024-09-08 NOTE — Patient Instructions (Signed)
  Rebekah [name] , Thank you for taking time to come for your Medicare Wellness Visit. I appreciate your ongoing commitment to your health goals. Please review the following plan we discussed and let me know if I can assist you in the future.   These are the goals we discussed:  Goals      Patient Stated     Patient states she would like to lose weight down to 135 lbs.         This is a list of the screening recommended for you and due dates:  Health Maintenance  Topic Date Due   Zoster (Shingles) Vaccine (2 of 2) 03/25/2018   DEXA scan (bone density measurement)  11/09/2023   Flu Shot  07/31/2024   COVID-19 Vaccine (4 - 2025-26 season) 08/31/2024   DTaP/Tdap/Td vaccine (3 - Td or Tdap) 02/08/2025   Mammogram  05/30/2025   Medicare Annual Wellness Visit  09/08/2025   Colon Cancer Screening  01/24/2026   Pneumococcal Vaccine for age over 24  Completed   Hepatitis C Screening  Completed   HPV Vaccine  Aged Out   Meningitis B Vaccine  Aged Out

## 2024-09-08 NOTE — Progress Notes (Signed)
 Subjective:   Rebekah [name] is a 69 y.o. female who presents for Medicare Annual (Subsequent) preventive examination.  Visit Complete: In person  Patient Medicare AWV questionnaire was completed by the patient on 09/07/2024; I have confirmed that all information answered by patient is correct and no changes since this date.  Cardiac Risk Factors include: advanced age (>19men, >17 women);dyslipidemia;obesity (BMI >30kg/m2)     Objective:    Today's Vitals   09/08/24 1303  BP: (!) 122/51  Pulse: (!) 54  SpO2: 100%  Weight: 162 lb (73.5 kg)  Height: 5' (1.524 m)   Body mass index is 31.64 kg/m.     09/08/2024    1:21 PM 08/31/2016    9:34 AM 09/20/2014   11:44 AM 05/28/2014    8:18 AM 04/14/2014    8:46 AM  Advanced Directives  Does Patient Have a Medical Advance Directive? Yes Yes  Yes  Patient has advance directive, copy not in chart  Patient has advance directive, copy not in chart   Type of Advance Directive Healthcare Power of [place];Living will Healthcare Power of [place];Living will  Healthcare Power of [place];Living will;Advance instruction for mental health treatment  Living will;Healthcare Power of Attorney    Does patient want to make changes to medical advance directive? No - Patient declined      Copy of Healthcare Power of Attorney in Chart?  No - copy requested  No - copy requested        Data saved with a previous flowsheet row definition    Current Medications (verified) Outpatient Encounter Medications as of 09/08/2024  Medication Sig   AMBULATORY NON FORMULARY MEDICATION Medication Name:  Nutrifii Magnical-d   AMBULATORY NON FORMULARY MEDICATION Medication Name:  Nutrifii rejuveniix   AMBULATORY NON FORMULARY MEDICATION Medication Name:  Optimal-m   AMBULATORY NON FORMULARY MEDICATION Medication Name:  Optimal-v   fluticasone  (FLONASE ) 50 MCG/ACT nasal spray    levocetirizine (XYZAL ) 5 MG tablet TAKE ONE TABLET BY MOUTH EVERY EVENING   lovastatin   (MEVACOR ) 40 MG tablet TAKE 1 TABLET BY MOUTH AT BEDTIME   tirzepatide (ZEPBOUND) 2.5 MG/0.5ML injection vial Inject 2.5 mg into the skin.   traZODone  (DESYREL ) 50 MG tablet TAKE 1/2 TO 1 TABLET BY MOUTH EVERY NIGHT AT BEDTIME   hydrOXYzine  (VISTARIL ) 25 MG capsule Take 1 capsule (25 mg total) by mouth every 8 (eight) hours as needed for itching. (Patient not taking: Reported on 09/08/2024)   triamcinolone  cream (KENALOG ) 0.1 % Apply 1 Application topically 2 (two) times daily. (Patient not taking: Reported on 09/08/2024)   [DISCONTINUED] methylPREDNISolone  (MEDROL  DOSEPAK) 4 MG TBPK tablet Take as directed by package insert.   [DISCONTINUED] topiramate  (TOPAMAX ) 25 MG tablet TAKE ONE TABLET BY MOUTH EVERY NIGHT AT BEDTIME FOR SEVEN DAYS, THEN INCREASE TO ONE TABLET TWO TIMES A DAY   [DISCONTINUED] UNABLE TO FIND Med Name: Allergy Shots   No facility-administered encounter medications on file as of 09/08/2024.    Allergies (verified) Gramineae pollens, Molds & smuts, Tree extract, and Dust mite extract   History: Past Medical History:  Diagnosis Date   Allergy    --takes allergy injections   Family history of bladder cancer    Family history of breast cancer    Family history of ovarian cancer    History of COVID-19 05/31/2021   Hyperlipidemia    Osteopenia 2020   Perimenopause    Past Surgical History:  Procedure Laterality Date   BREAST BIOPSY  DILATION AND CURETTAGE OF UTERUS  10/31/2010   Hysteroscopy with dilation and curettage - Dr. Nikki - [facility]   KNEE SURGERY Bilateral    REDUCTION MAMMAPLASTY Bilateral    REFRACTIVE SURGERY Bilateral    Family History  Problem Relation Age of Onset   Hypertension Mother    Stroke Mother    Cancer Mother 8       ovarian ca, hx of TTP   Hyperlipidemia Mother    Ovarian cancer Mother    Hypertension Father    Cancer Father 78       bladder ca   Hyperlipidemia Father    Dementia Father        lewy body dementia   Anemia  Sister    Mental illness Sister        mentally disabled--lives in group home in [place]   Diabetes Brother    Multiple myeloma Brother    Asthma Brother        allergy induced   Ovarian cancer Maternal Grandmother    Breast cancer Cousin        dx 48s   Social History   Socioeconomic History   Marital status: Widowed    Spouse name: Not on file   Number of children: 2   Years of education: Not on file   Highest education level: Some college, no degree  Occupational History   Not on file  Tobacco Use   Smoking status: Never   Smokeless tobacco: Never  Vaping Use   Vaping status: Never Used  Substance and Sexual Activity   Alcohol use: Yes    Alcohol/week: 1.0 standard drink of alcohol    Types: 1 Glasses of wine per week   Drug use: Never   Sexual activity: Not Currently    Partners: Male    Birth control/protection: Post-menopausal  Other Topics Concern   Not on file  Social History Narrative   Diani lives alone. She lives next to her daughter and her family. She enjoys gardening and crocheting.    Social Drivers of Corporate investment banker Strain: Low Risk  (09/08/2024)   Overall Financial Resource Strain (CARDIA)    Difficulty of Paying Living Expenses: Not very hard  Food Insecurity: No Food Insecurity (09/08/2024)   Hunger Vital Sign    Worried About Running Out of Food in the Last Year: Never true    Ran Out of Food in the Last Year: Never true  Transportation Needs: No Transportation Needs (09/08/2024)   PRAPARE - Administrator, Civil Service (Medical): No    Lack of Transportation (Non-Medical): No  Physical Activity: Sufficiently Active (09/08/2024)   Exercise Vital Sign    Days of Exercise per Week: 4 days    Minutes of Exercise per Session: 50 min  Recent Concern: Physical Activity - Inactive (07/21/2024)   [facility]   Exercise Vital Sign    On average, how many days per week do you engage in moderate to strenuous  exercise (like a brisk walk)?: 0 days    Minutes of Exercise per Session: Not on file  Stress: No Stress Concern Present (09/08/2024)   [employer] of Occupational Health - Occupational Stress Questionnaire    Feeling of Stress: Not at all  Social Connections: Moderately Isolated (09/08/2024)   Social Connection and Isolation Panel    Frequency of Communication with Friends and Family: More than three times a week    Frequency of Social Gatherings with  Friends and Family: More than three times a week    Attends Religious Services: More than 4 times per year    Active Member of Clubs or Organizations: No    Attends Banker Meetings: Never    Marital Status: Widowed    Tobacco Counseling Counseling given: Not Answered   Clinical Intake:  Pre-visit preparation completed: Yes  Pain : No/denies pain     BMI - recorded: 31.64 Nutritional Status: BMI > 30  Obese Nutritional Risks: None Diabetes: No  How often do you need to have someone help you when you read instructions, pamphlets, or other written materials from your doctor or pharmacy?: 1 - Never What is the last grade level you completed in school?: 13  Interpreter Needed?: No      Activities of Daily Living    09/08/2024    1:07 PM 09/07/2024    2:30 PM  In your present state of health, do you have any difficulty performing the following activities:  Hearing? 0 0  Vision? 0 0  Difficulty concentrating or making decisions? 0 0  Walking or climbing stairs? 0 0  Dressing or bathing? 0 0  Doing errands, shopping? 0 0  Preparing Food and eating ? N N  Using the Toilet? N N  In the past six months, have you accidently leaked urine? N N  Do you have problems with loss of bowel control? N N  Managing your Medications? N N  Managing your Finances? N N  Housekeeping or managing your Housekeeping? N N    Patient Care Team: Breeback, Jade L, PA-C as PCP - General (Family Medicine) Cathlyn JAYSON Nikki Bobie FORBES, MD as Consulting Physician (Obstetrics and Gynecology) Court Pulling, MD as Referring Physician (Dermatology) Noma Conte, MD as Referring Physician (Neurology)  Indicate any recent Medical Services you may have received from other than [facility] providers in the past year (date may be approximate).     Assessment:   This is a routine wellness examination for Zahrah.  Hearing/Vision screen No results found.   Goals Addressed             This Visit's Progress    Patient Stated       Patient states she would like to lose weight down to 135 lbs.        Depression Screen    09/08/2024    1:20 PM 07/15/2023    8:28 AM 04/12/2023   10:13 AM 03/25/2023    1:17 PM 07/11/2022    1:11 PM 04/03/2022    1:50 PM 10/30/2021    1:54 PM  PHQ 2/9 Scores  PHQ - 2 Score 0 0 0 0 0 0 0  PHQ- 9 Score      0     Fall Risk    09/08/2024    1:22 PM 09/07/2024    2:30 PM 07/15/2023    8:28 AM 04/12/2023   10:13 AM 03/25/2023    1:17 PM  Fall Risk   Falls in the past year? 1 0 0 0 0  Number falls in past yr: 0  0 0 0  Injury with Fall? 0  0 0 0  Risk for fall due to : No Fall Risks  No Fall Risks No Fall Risks No Fall Risks  Follow up Falls evaluation completed  Falls evaluation completed Falls evaluation completed Falls evaluation completed    MEDICARE RISK AT HOME: Medicare Risk at Home Any stairs in or around the  home?: Yes If so, are there any without handrails?: No Home free of loose throw rugs in walkways, pet beds, electrical cords, etc?: Yes Adequate lighting in your home to reduce risk of falls?: Yes Life alert?: No Use of a cane, walker or w/c?: No Grab bars in the bathroom?: No Shower chair or bench in shower?: Yes Elevated toilet seat or a handicapped toilet?: Yes  TIMED UP AND GO:  Was the test performed?  Yes  Length of time to ambulate 10 feet: 8 sec Gait steady and fast without use of assistive device    Cognitive Function:        09/08/2024    1:22 PM 04/03/2022     1:12 PM  6CIT Screen  What Year? 0 points 0 points  What month? 0 points 0 points  What time? 0 points 0 points  Count back from 20 0 points 0 points  Months in reverse 0 points 0 points  Repeat phrase 0 points 2 points  Total Score 0 points 2 points    Immunizations Immunization History  Administered Date(s) Administered   Fluad Quad(high Dose 65+) 10/30/2021   Influenza,inj,Quad PF,6+ Mos 02/08/2015, 01/11/2016, 08/28/2016, 10/08/2018, 10/21/2020   Influenza,inj,quad, With Preservative 10/22/2017   Influenza-Unspecified 11/26/2012, 10/23/2017, 10/08/2018, 09/15/2019   Moderna Sars-Covid-2 Vaccination 02/26/2020, 03/25/2020, 12/05/2020   PNEUMOCOCCAL CONJUGATE-20 10/30/2021   Tdap 12/11/2012, 02/08/2015   Varicella Zoster Immune Globulin 06/04/2011   Zoster Recombinant(Shingrix ) 10/28/2017, 01/28/2018   Zoster, Live 01/28/2018    TDAP status: Up to date  Flu Vaccine status: Completed at today's visit  Pneumococcal vaccine status: Up to date  Covid-19 vaccine status: Declined, Education has been provided regarding the importance of this vaccine but patient still declined. Advised may receive this vaccine at local pharmacy or Health Dept.or vaccine clinic. Aware to provide a copy of the vaccination record if obtained from local pharmacy or Health Dept. Verbalized acceptance and understanding.  Qualifies for Shingles Vaccine? Yes   Zostavax completed Yes   Shingrix  Completed?: Yes  Screening Tests Health Maintenance  Topic Date Due   Zoster Vaccines- Shingrix  (2 of 2) 03/25/2018   DEXA SCAN  11/09/2023   Influenza Vaccine  07/31/2024   COVID-19 Vaccine (4 - 2025-26 season) 08/31/2024   DTaP/Tdap/Td (3 - Td or Tdap) 02/08/2025   MAMMOGRAM  05/30/2025   Medicare Annual Wellness (AWV)  09/08/2025   Colonoscopy  01/24/2026   Pneumococcal Vaccine: 50+ Years  Completed   Hepatitis C Screening  Completed   HPV VACCINES  Aged Out   Meningococcal B Vaccine  Aged Out     Health Maintenance  Health Maintenance Due  Topic Date Due   Zoster Vaccines- Shingrix  (2 of 2) 03/25/2018   DEXA SCAN  11/09/2023   Influenza Vaccine  07/31/2024   COVID-19 Vaccine (4 - 2025-26 season) 08/31/2024    Colorectal cancer screening: Type of screening: Colonoscopy. Completed 01/25/2016. Repeat every 10 years  Mammogram status: Ordered 09/08/2024. Pt provided with contact info and advised to call to schedule appt.   Bone Density status: Ordered 09/08/2024. Pt provided with contact info and advised to call to schedule appt.  Lung Cancer Screening: (Low Dose CT Chest recommended if Age 52-80 years, 20 pack-year currently smoking OR have quit w/in 15years.) does not qualify.   Lung Cancer Screening Referral: n/a  Additional Screening:  Hepatitis C Screening: does qualify; Completed 01/11/2016  Vision Screening: Recommended annual ophthalmology exams for early detection of glaucoma and other disorders of the eye. Is  the patient up to date with their annual eye exam?  Yes  Who is the provider or what is the name of the office in which the patient attends annual eye exams? [facility] - Dr Arloa If pt is not established with a provider, would they like to be referred to a provider to establish care? N/a.   Dental Screening: Recommended annual dental exams for proper oral hygiene    Community Resource Referral / Chronic Care Management: CRR required this visit?  No   CCM required this visit?  No     Plan:     I have personally reviewed and noted the following in the patient's chart:   Medical and social history Use of alcohol, tobacco or illicit drugs  Current medications and supplements including opioid prescriptions. Patient is not currently taking opioid prescriptions. Functional ability and status Nutritional status Physical activity Advanced directives List of other physicians Hospitalizations, surgeries, and ER visits in previous 12 months.  None Vitals Screenings to include cognitive, depression, and falls Referrals and appointments  In addition, I have reviewed and discussed with patient certain preventive protocols, quality metrics, and best practice recommendations. A written personalized care plan for preventive services as well as general preventive health recommendations were provided to patient.     Bonny Jon Mayor, CMA   09/08/2024   After Visit Summary: (In Person-Printed) AVS printed and given to the patient  Nurse Notes:   CYNDEL GRIFFEY is a 69 y.o. female patient of Vermell Bologna, PA-C who had a Medicare Annual Wellness Visit today. She reports that she is socially active and does interact with friends/family regularly. She is moderately physically active.

## 2024-09-16 ENCOUNTER — Other Ambulatory Visit (HOSPITAL_COMMUNITY)
Admission: RE | Admit: 2024-09-16 | Discharge: 2024-09-16 | Disposition: A | Discharge status: Home / Self Care | Source: Ambulatory Visit | Attending: Physician Assistant | Admitting: Physician Assistant

## 2024-09-16 ENCOUNTER — Ambulatory Visit: Admitting: Physician Assistant

## 2024-09-16 VITALS — BP 138/62 | HR 61 | Ht 60.0 in | Wt 161.0 lb

## 2024-09-16 DIAGNOSIS — M810 Age-related osteoporosis without current pathological fracture: Secondary | ICD-10-CM

## 2024-09-16 DIAGNOSIS — Z8041 Family history of malignant neoplasm of ovary: Secondary | ICD-10-CM | POA: Insufficient documentation

## 2024-09-16 DIAGNOSIS — M25562 Pain in left knee: Secondary | ICD-10-CM | POA: Diagnosis not present

## 2024-09-16 DIAGNOSIS — J302 Other seasonal allergic rhinitis: Secondary | ICD-10-CM | POA: Diagnosis not present

## 2024-09-16 DIAGNOSIS — Z79899 Other long term (current) drug therapy: Secondary | ICD-10-CM | POA: Diagnosis not present

## 2024-09-16 DIAGNOSIS — E66811 Obesity, class 1: Secondary | ICD-10-CM

## 2024-09-16 DIAGNOSIS — Z1273 Encounter for screening for malignant neoplasm of ovary: Secondary | ICD-10-CM | POA: Diagnosis present

## 2024-09-16 DIAGNOSIS — G4733 Obstructive sleep apnea (adult) (pediatric): Secondary | ICD-10-CM | POA: Diagnosis not present

## 2024-09-16 DIAGNOSIS — Z1231 Encounter for screening mammogram for malignant neoplasm of breast: Secondary | ICD-10-CM

## 2024-09-16 DIAGNOSIS — Z91048 Other nonmedicinal substance allergy status: Secondary | ICD-10-CM

## 2024-09-16 DIAGNOSIS — K2100 Gastro-esophageal reflux disease with esophagitis, without bleeding: Secondary | ICD-10-CM

## 2024-09-16 DIAGNOSIS — E6609 Other obesity due to excess calories: Secondary | ICD-10-CM

## 2024-09-16 DIAGNOSIS — E7800 Pure hypercholesterolemia, unspecified: Secondary | ICD-10-CM

## 2024-09-16 DIAGNOSIS — Z131 Encounter for screening for diabetes mellitus: Secondary | ICD-10-CM

## 2024-09-16 MED ORDER — MONTELUKAST SODIUM 10 MG PO TABS: 10.0000 mg | ORAL_TABLET | Freq: Every day | ORAL | 3 refills | Status: AC

## 2024-09-16 NOTE — Patient Instructions (Signed)
 Consider knee strap or brace Continue to work on weight loss Voltaren  gel as needed with icing  Patellofemoral Pain Syndrome  Patellofemoral pain syndrome or PFPS is a condition that causes pain in front of the knee and around the kneecap. The kneecap is also called the patella. Your kneecap covers the front of the knee and is attached to muscles above and below the knee. PFPS can be caused by many things. PFPS is most common in active young adults. What are the causes? PFPS may be caused by: Using your knee too much. The smooth tissue that is called cartilage on the underside of the kneecap breaking down. This is called chondromalacia patella or runner's knee. Your knee joint not being aligned well. Weak leg muscles. A hit to your kneecap. What increases the risk? You are more likely to develop PFPS if: You do a lot of activities over and over again that can wear down your kneecap. These include: Running. Squatting. Climbing stairs. You wear shoes that do not fit well. You do not have a lot of leg strength. You are overweight. What are the signs or symptoms? The main symptom of PFPS is knee pain. This may feel like a dull pain under your kneecap. There may be a popping or cracking sound when you move your knee. Pain may get worse when you: Exercise. Climb stairs. Run. Jump. Squat. Kneel. Sit for a long time. Move or push on your kneecap. How is this diagnosed? PFPS may be diagnosed based on: Your symptoms and medical history. You may be asked about your activities and which ones cause knee pain. A physical exam. This may include: Moving your kneecap back and forth. Checking how you move your knee. Having you squat or jump to see if you have pain. Checking the strength of your leg muscles. Imaging tests. These may include an MRI of your knee. How is this treated? PFPS may be treated at home with rest, ice, pressure (compression), and elevation. This is called RICE  therapy. Other treatments may include: NSAIDs, such as ibuprofen . Doing physical therapy exercises to improve movement and strength in your leg. Shoe inserts to take stress off your knee. Sports tape to support the kneecap. Surgery to remove damaged tissue or move the kneecap to a better position. This is rare. Follow these instructions at home: Managing pain, stiffness, and swelling  If told, put ice on the area. Put ice in a plastic bag. Place a towel between your skin and the bag. Leave the ice on for 20 minutes, 2-3 times a day. If your skin turns bright red, take off the ice right away to prevent skin damage. The risk of damage is higher if you can't feel pain, heat, or cold. Move your toes often to reduce stiffness and swelling. Raisethe injured area above the level of your heart while you are sitting or lying down. Activity Rest your knee as told by your health care provider. Avoid activities that cause knee pain. Do stretching and strengthening exercises as told by your provider or physical therapist. Return to your normal activities as told by your provider. Ask your provider what activities are safe for you. General instructions Take over-the-counter and prescription medicines only as told by your provider. Use shoe inserts as told. Put sports tape on your knee as told. Do not use any products that contain nicotine or tobacco. These products include cigarettes, chewing tobacco, and vaping devices, such as e-cigarettes. If you need help quitting, ask your provider.  Keep all follow-up visits. Your provider will watch your pain and try other treatments if needed. Contact a health care provider if: Your symptoms get worse. Your knee pain does not get better with home care. This information is not intended to replace advice given to you by your health care provider. Make sure you discuss any questions you have with your health care provider. Document Revised: 02/14/2023 Document  Reviewed: 02/14/2023 Elsevier Patient Education  2024 [employer].

## 2024-09-16 NOTE — Progress Notes (Unsigned)
 Established Patient Office Visit  Subjective   Patient ID: Rebekah [name], female    DOB: 1955/06/02  Age: 69 y.o. MRN: [id number]  Chief Complaint  Patient presents with   Medical Management of Chronic Issues    Labs, fasting uses zepbound every two weeks pays for directly from [employer]     HPI Discussed the use of AI scribe software for clinical note transcription with the patient, who gave verbal consent to proceed.  History of Present Illness Rebekah [name] is a 69 year old female who presents with weight management and knee pain.  Weight management - Initiated Zabound for weight management on August 17, 2024 with [facility] - Administers Zabound every two weeks - Achieved nine-pound weight loss over the past month - Engages in regular exercise by walking with her neighbor for the past four weeks  Knee pain - Experiences knee pain that worsens with activity, particularly after prolonged walking - Pain is located under the patella and sometimes throbs - Pain is more pronounced in the evenings after being on her feet all day - History of similar knee issues years ago, suspects arthritis - Recently aggravated knee while walking on the beach  Fatigue and sleep disturbance - Persistent fatigue despite use of CPAP for sleep apnea - Difficulty initiating sleep; uses trazodone  for sleep, with Ambien available as needed - Averages approximately ten hours of sleep per night but continues to feel tired  Allergic and respiratory symptoms - History of allergies with symptoms resembling asthma, such as sensation of being around smoke - Uses Xyzal  and Claritin for allergies and Flonase  nasal spray - Symptoms worsen at the beach  Back pain - Persistent but not severe back pain - Resumed chiropractic care in February or March 2025, which has been helpful  Cardiovascular and metabolic health - Currently on lovastatin  for cholesterol management - Blood pressure measured at 138/60 mmHg  during visit; typically around 122/80 mmHg at home - Monitors blood pressure occasionally and has noticed a decreasing trend over time - No chest pain or shortness of breath    ROS See HPI.    Objective:     BP 138/62   Pulse 61   Ht 5' (1.524 m)   Wt 161 lb (73 kg)   LMP 12/31/2009 (Approximate)   SpO2 99%   BMI 31.44 kg/m  BP Readings from Last 3 Encounters:  09/16/24 138/62  09/08/24 (!) 122/51  02/21/24 124/62   Wt Readings from Last 3 Encounters:  09/16/24 161 lb (73 kg)  09/08/24 162 lb (73.5 kg)  02/21/24 165 lb (74.8 kg)      Physical Exam Constitutional:      Appearance: Normal appearance. She is obese.  HENT:     Head: Normocephalic.  Cardiovascular:     Rate and Rhythm: Normal rate and regular rhythm.     Heart sounds: Murmur heard.  Pulmonary:     Effort: Pulmonary effort is normal.     Breath sounds: Normal breath sounds.  Musculoskeletal:     Right lower leg: No edema.     Left lower leg: No edema.  Neurological:     General: No focal deficit present.     Mental Status: She is alert and oriented to person, place, and time.  Psychiatric:        Mood and Affect: Mood normal.        The 10-year ASCVD risk score (Arnett DK, et al., 2019) is: 8.3%  Assessment & Plan:  Rebekah [name] was seen today for medical management of chronic issues.  Diagnoses and all orders for this visit:  Elevated LDL cholesterol level -     Lipid panel  Class 1 obesity due to excess calories without serious comorbidity with body mass index (BMI) of 31.0 to 31.9 in adult -     CBC with Differential/Platelet -     CMP14+EGFR -     Lipid panel -     VITAMIN D  25 Hydroxy (Vit-D Deficiency, Fractures) -     B12 and Folate Panel -     TSH + free T4 -     CA 125  Medication management -     CBC with Differential/Platelet -     CMP14+EGFR -     Lipid panel -     VITAMIN D  25 Hydroxy (Vit-D Deficiency, Fractures) -     B12 and Folate Panel -     TSH + free T4 -      CA 125  Gastroesophageal reflux disease with esophagitis without hemorrhage  Allergy to mold  OSA (obstructive sleep apnea)  Family history of ovarian cancer -     CA 125 -     Cytology - non gyn  Screening for diabetes mellitus -     CMP14+EGFR  Acute pain of left knee  Age-related osteoporosis without current pathological fracture -     DG Bone Density; Future  Visit for screening mammogram -     MM 3D SCREENING MAMMOGRAM BILATERAL BREAST  Seasonal allergies -     montelukast  (SINGULAIR ) 10 MG tablet; Take 1 tablet (10 mg total) by mouth at bedtime.   Assessment and Plan Assessment & Plan Osteoarthritis of knee with chronic knee pain Chronic knee pain likely due to osteoarthritis, exacerbated by activity. - Recommend weight loss to reduce knee impact. - Suggest using Voltaren  gel for pain relief. - Consider using a patella strap or knee sleeve during activities. - Consider referral to sports medicine for possible knee injection if symptoms persist.  Obesity, class 1 Weight loss of 9 pounds in a month with zepbound and regular physical activity. - Encourage regular physical activity, such as walking. -continue management with [facility]  Obstructive sleep apnea, on CPAP Variable sleep quality with CPAP use. - Consider adding magnesium for sleep and muscle health.  Fatigue Persistent fatigue despite adequate sleep duration and CPAP use. - Evaluate blood work for potential underlying causes of fatigue.  Allergic rhinitis with possible asthma symptoms Symptoms suggestive of asthma, especially at night, managed with Xyzal , Claritin, and Flonase . - Consider adding Singulair  at night during the fall season.  Pure hypercholesterolemia On lovastatin , recent cholesterol levels not checked. - Order cholesterol level test.  Family hx of ovarian cancer and she has genetic causes that increase cancer risk Urine cytology ordered today for follow up   Return in about  1 year (around 09/16/2025).    Devinne Epstein, PA-C

## 2024-09-17 LAB — CMP14+EGFR
ALT: 26 IU/L (ref 0–32)
AST: 26 IU/L (ref 0–40)
Albumin: 4.4 g/dL (ref 3.9–4.9)
Alkaline Phosphatase: 78 IU/L (ref 49–135)
BUN/Creatinine Ratio: 21 (ref 12–28)
BUN: 14 mg/dL (ref 8–27)
Bilirubin Total: 0.6 mg/dL (ref 0.0–1.2)
CO2: 26 mmol/L (ref 20–29)
Calcium: 9.3 mg/dL (ref 8.7–10.3)
Chloride: 103 mmol/L (ref 96–106)
Creatinine, Ser: 0.66 mg/dL (ref 0.57–1.00)
Globulin, Total: 2.3 g/dL (ref 1.5–4.5)
Glucose: 72 mg/dL (ref 70–99)
Potassium: 4.2 mmol/L (ref 3.5–5.2)
Sodium: 142 mmol/L (ref 134–144)
Total Protein: 6.7 g/dL (ref 6.0–8.5)
eGFR: 95 mL/min/1.73 (ref >59)

## 2024-09-17 LAB — CBC WITH DIFFERENTIAL/PLATELET
Basophils Absolute: 0 x10E3/uL (ref 0.0–0.2)
Basos: 1 %
EOS (ABSOLUTE): 0.1 x10E3/uL (ref 0.0–0.4)
Eos: 3 %
Hematocrit: 44.4 % (ref 34.0–46.6)
Hemoglobin: 14.3 g/dL (ref 11.1–15.9)
Immature Grans (Abs): 0 x10E3/uL (ref 0.0–0.1)
Immature Granulocytes: 0 %
Lymphocytes Absolute: 1.5 x10E3/uL (ref 0.7–3.1)
Lymphs: 31 %
MCH: 29.5 pg (ref 26.6–33.0)
MCHC: 32.2 g/dL (ref 31.5–35.7)
MCV: 92 fL (ref 79–97)
Monocytes Absolute: 0.3 x10E3/uL (ref 0.1–0.9)
Monocytes: 6 %
Neutrophils Absolute: 3 x10E3/uL (ref 1.4–7.0)
Neutrophils: 59 %
Platelets: 200 x10E3/uL (ref 150–450)
RBC: 4.85 x10E6/uL (ref 3.77–5.28)
RDW: 13.4 % (ref 11.7–15.4)
WBC: 5 x10E3/uL (ref 3.4–10.8)

## 2024-09-17 LAB — TSH+FREE T4
Free T4: 0.92 ng/dL (ref 0.82–1.77)
TSH: 1.98 u[IU]/mL (ref 0.450–4.500)

## 2024-09-17 LAB — B12 AND FOLATE PANEL
Folate: >20 ng/mL (ref >3.0)
Vitamin B-12: 608 pg/mL (ref 232–1245)

## 2024-09-17 LAB — VITAMIN D 25 HYDROXY (VIT D DEFICIENCY, FRACTURES): Vit D, 25-Hydroxy: 46.7 ng/mL (ref 30.0–100.0)

## 2024-09-17 LAB — LIPID PANEL
Chol/HDL Ratio: 3 ratio (ref 0.0–4.4)
Cholesterol, Total: 184 mg/dL (ref 100–199)
HDL: 62 mg/dL (ref >39)
LDL Chol Calc (NIH): 106 mg/dL — ABNORMAL HIGH (ref 0–99)
Triglycerides: 88 mg/dL (ref 0–149)
VLDL Cholesterol Cal: 16 mg/dL (ref 5–40)

## 2024-09-17 LAB — CYTOLOGY - NON PAP

## 2024-09-17 LAB — CA 125: Cancer Antigen (CA) 125: 11.5 U/mL (ref 0.0–38.1)

## 2024-09-18 ENCOUNTER — Encounter: Payer: Self-pay | Admitting: Physician Assistant

## 2024-09-21 ENCOUNTER — Ambulatory Visit: Payer: Self-pay | Admitting: Physician Assistant

## 2024-09-21 DIAGNOSIS — M8580 Other specified disorders of bone density and structure, unspecified site: Secondary | ICD-10-CM

## 2024-09-21 NOTE — Progress Notes (Signed)
 Rebekah [name],   Urine cytology negative. CA125 normal.  LDL at 106 not quite to optimal goal. Continue to work on diet and exercise.  Kidney, liver, glucose look good.  Vitamin d  normal.  B12 normal.  Thyroid  normal.

## 2024-09-29 ENCOUNTER — Encounter (HOSPITAL_BASED_OUTPATIENT_CLINIC_OR_DEPARTMENT_OTHER): Payer: Self-pay

## 2024-09-29 ENCOUNTER — Ambulatory Visit (HOSPITAL_BASED_OUTPATIENT_CLINIC_OR_DEPARTMENT_OTHER)
Admission: RE | Admit: 2024-09-29 | Discharge: 2024-09-29 | Disposition: A | Discharge status: Home / Self Care | Source: Ambulatory Visit | Attending: Physician Assistant | Admitting: Physician Assistant

## 2024-09-29 DIAGNOSIS — Z1231 Encounter for screening mammogram for malignant neoplasm of breast: Secondary | ICD-10-CM | POA: Insufficient documentation

## 2024-10-02 NOTE — Progress Notes (Signed)
 Normal mammogram. Follow up in 1 year.

## 2024-10-07 DIAGNOSIS — R634 Abnormal weight loss: Secondary | ICD-10-CM | POA: Diagnosis not present

## 2024-10-20 ENCOUNTER — Other Ambulatory Visit (HOSPITAL_BASED_OUTPATIENT_CLINIC_OR_DEPARTMENT_OTHER)

## 2024-10-22 ENCOUNTER — Ambulatory Visit (HOSPITAL_BASED_OUTPATIENT_CLINIC_OR_DEPARTMENT_OTHER)
Admission: RE | Admit: 2024-10-22 | Discharge: 2024-10-22 | Disposition: A | Discharge status: Home / Self Care | Source: Ambulatory Visit | Attending: Physician Assistant | Admitting: Physician Assistant

## 2024-10-22 DIAGNOSIS — M810 Age-related osteoporosis without current pathological fracture: Secondary | ICD-10-CM | POA: Insufficient documentation

## 2024-10-22 DIAGNOSIS — Z780 Asymptomatic menopausal state: Secondary | ICD-10-CM | POA: Diagnosis not present

## 2024-10-22 DIAGNOSIS — M8589 Other specified disorders of bone density and structure, multiple sites: Secondary | ICD-10-CM | POA: Diagnosis not present

## 2024-10-23 DIAGNOSIS — M8580 Other specified disorders of bone density and structure, unspecified site: Secondary | ICD-10-CM | POA: Insufficient documentation

## 2024-10-23 NOTE — Progress Notes (Signed)
 Rebekah [name],   Bone Density shows osteopenia( low bone mass). Keeping working on osteoporosis prevention with daily vitamin D  and calcium and regular strength training.

## 2024-10-28 ENCOUNTER — Ambulatory Visit: Admitting: Obstetrics and Gynecology

## 2024-10-28 ENCOUNTER — Encounter: Payer: Self-pay | Admitting: Obstetrics and Gynecology

## 2024-10-28 ENCOUNTER — Other Ambulatory Visit (HOSPITAL_COMMUNITY)
Admission: RE | Admit: 2024-10-28 | Discharge: 2024-10-28 | Disposition: A | Discharge status: Home / Self Care | Source: Ambulatory Visit | Attending: Obstetrics and Gynecology | Admitting: Obstetrics and Gynecology

## 2024-10-28 VITALS — BP 118/76 | HR 65 | Ht 60.25 in | Wt 158.0 lb

## 2024-10-28 DIAGNOSIS — Z124 Encounter for screening for malignant neoplasm of cervix: Secondary | ICD-10-CM | POA: Insufficient documentation

## 2024-10-28 DIAGNOSIS — Q8789 Other specified congenital malformation syndromes, not elsewhere classified: Secondary | ICD-10-CM | POA: Diagnosis not present

## 2024-10-28 DIAGNOSIS — Z8041 Family history of malignant neoplasm of ovary: Secondary | ICD-10-CM

## 2024-10-28 DIAGNOSIS — Z9289 Personal history of other medical treatment: Secondary | ICD-10-CM

## 2024-10-28 DIAGNOSIS — M8589 Other specified disorders of bone density and structure, multiple sites: Secondary | ICD-10-CM | POA: Diagnosis not present

## 2024-10-28 DIAGNOSIS — B009 Herpesviral infection, unspecified: Secondary | ICD-10-CM | POA: Diagnosis not present

## 2024-10-28 DIAGNOSIS — Z01419 Encounter for gynecological examination (general) (routine) without abnormal findings: Secondary | ICD-10-CM

## 2024-10-28 NOTE — Patient Instructions (Signed)

## 2024-10-28 NOTE — Progress Notes (Unsigned)
 69 y.o. G31P2012 Widowed Caucasian female here for a breast and pelvic exam.    No GYN concerns.    The patient is followed for FH ovarian cancer.  She is asking about her genetic testing done in 2022.   Retired and has returned to work.  Son married in April. 3 grandsons.  Both parents and her sister have passed away.   Pelvic US  01/13/24: Study Result  Narrative & Impression  CLINICAL DATA:  Right lower quadrant pain.  Postmenopausal patient.   EXAM: TRANSABDOMINAL AND TRANSVAGINAL ULTRASOUND OF PELVIS   DOPPLER ULTRASOUND OF OVARIES   TECHNIQUE: Both transabdominal and transvaginal ultrasound examinations of the pelvis were performed. Transabdominal technique was performed for global imaging of the pelvis including uterus, ovaries, adnexal regions, and pelvic cul-de-sac.   It was necessary to proceed with endovaginal exam following the transabdominal exam to visualize the adnexal structures. Color and duplex Doppler ultrasound was utilized to evaluate blood flow to the ovaries.   COMPARISON:  Pelvic ultrasound 10/12/2014   FINDINGS: Uterus   Measurements: 7.1 x 2.7 x 5.0 cm = volume: 50.6 mL. No fibroids or other mass visualized.   Endometrium   Thickness: 5.8 mm.  No focal abnormality visualized.   Right ovary   Measurements: 2.0 x 1.0 x 1.6 cm = volume: 1.7 mL. Normal appearance/no adnexal mass.   Left ovary   Measurements: 2.2 x 1.1 x 1.5 cm = volume: 1.8 mL. There is an 8 mm cyst within the left ovary.   Pulsed Doppler evaluation of both ovaries demonstrates normal low-resistance arterial and venous waveforms.   Other findings   No abnormal free fluid.   IMPRESSION: 1. No acute process within the pelvis. 2. Subcentimeter left ovarian cyst, normal finding. No follow-up imaging is recommended. Reference: Radiology 2019 Nov;293(2):359-371     Electronically Signed   By: Bard Moats M.D.   On: 01/13/2024 13:29   Normal CT of abdomen and  pelvis.   PCP: Antoniette Vermell CROME, PA-C   Patient's last menstrual period was 12/31/2009 (approximate).           Sexually active: No.  The current method of family planning is post menopausal status.    Menopausal hormone therapy:  n/a Exercising: Yes.    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  FRAX:  15.8%/2.2%  Immunization History  Administered Date(s) Administered   Fluad Quad(high Dose 65+) 10/30/2021   INFLUENZA, HIGH DOSE SEASONAL PF 09/08/2024   Influenza,inj,Quad PF,6+ Mos 02/08/2015, 01/11/2016, 08/28/2016, 10/08/2018, 10/21/2020   Influenza,inj,quad, With Preservative 10/22/2017   Influenza-Unspecified 11/26/2012, 10/23/2017, 10/08/2018, 09/15/2019   Moderna Sars-Covid-2 Vaccination 02/26/2020, 03/25/2020, 12/05/2020   PNEUMOCOCCAL CONJUGATE-20 10/30/2021   Tdap 12/11/2012,  02/08/2015   Varicella Zoster Immune Globulin 06/04/2011   Zoster Recombinant(Shingrix ) 10/28/2017, 01/28/2018   Zoster, Live 01/28/2018      reports that she has never smoked. She has never used smokeless tobacco. She reports current alcohol use of about 1.0 standard drink of alcohol per week. She reports that she does not use drugs.  Past Medical History:  Diagnosis Date   Allergy    --takes allergy injections   Birt-Hogg-Dube syndrome 2022   See genetic test results 02/13/21 in Epic.  Increased risk of renal cancer, pneumothorax.  Needs follow up with pulmonary and urology.   Family history of  bladder cancer    Family history of breast cancer    Family history of ovarian cancer    History of COVID-19 05/31/2021   HSV-1 infection    Hyperlipidemia    Osteopenia 2020   Perimenopause     Past Surgical History:  Procedure Laterality Date   BREAST BIOPSY     DILATION AND CURETTAGE OF UTERUS  10/31/2010   Hysteroscopy with dilation and curettage - Dr. Nikki - [facility]   KNEE SURGERY Bilateral    REDUCTION MAMMAPLASTY Bilateral    REFRACTIVE SURGERY Bilateral     Current Outpatient Medications  Medication Sig Dispense Refill   AMBULATORY NON FORMULARY MEDICATION Medication Name:  Nutrifii Magnical-d     AMBULATORY NON FORMULARY MEDICATION Medication Name:  Nutrifii rejuveniix     AMBULATORY NON FORMULARY MEDICATION Medication Name:  Optimal-m     AMBULATORY NON FORMULARY MEDICATION Medication Name:  Optimal-v     fluticasone  (FLONASE ) 50 MCG/ACT nasal spray      levocetirizine (XYZAL ) 5 MG tablet TAKE ONE TABLET BY MOUTH EVERY EVENING 90 tablet 3   lovastatin  (MEVACOR ) 40 MG tablet TAKE 1 TABLET BY MOUTH AT BEDTIME 90 tablet 0   montelukast  (SINGULAIR ) 10 MG tablet Take 1 tablet (10 mg total) by mouth at bedtime. 90 tablet 3   tirzepatide (ZEPBOUND) 2.5 MG/0.5ML injection vial Inject 2.5 mg into the skin.     traZODone  (DESYREL ) 50 MG tablet TAKE 1/2 TO 1 TABLET BY MOUTH EVERY NIGHT AT BEDTIME 90 tablet 1   No current facility-administered medications for this visit.    ALLERGIES: Gramineae pollens, Molds & smuts, Tree extract, and Dust mite extract  Family History  Problem Relation Age of Onset   Hypertension Mother    Stroke Mother    Cancer Mother 35       ovarian ca, hx of TTP   Hyperlipidemia Mother    Ovarian cancer Mother    Hypertension Father    Cancer Father 35       bladder ca   Hyperlipidemia Father    Dementia Father        lewy body dementia   Anemia Sister    Mental illness Sister        mentally disabled--lives in group home in [place]    Diabetes Brother    Multiple myeloma Brother    Asthma Brother        allergy induced   Ovarian cancer Maternal Grandmother    Breast cancer Cousin        dx 52s    Review of Systems  All other systems reviewed and are negative.   PHYSICAL EXAM:  BP 118/76 (BP Location: Left Arm, Patient Position: Sitting)   Pulse 65   Ht 5' 0.25 (1.53 m)   Wt 158 lb (71.7 kg)   LMP 12/31/2009 (Approximate)   SpO2  97%   BMI 30.60 kg/m     General appearance: alert, cooperative and appears stated age Head: normocephalic, without obvious abnormality, atraumatic Neck: no adenopathy, supple, symmetrical, trachea midline and thyroid  normal to inspection and palpation Lungs: clear to auscultation bilaterally Breasts: normal appearance, no masses or tenderness, No nipple retraction or dimpling, No nipple discharge or bleeding, No axillary adenopathy Heart: regular rate and rhythm Abdomen: soft, non-tender; no masses, no organomegaly Extremities: extremities normal, atraumatic, no cyanosis or edema Skin: skin color, texture, turgor normal. No rashes or lesions Lymph nodes: cervical, supraclavicular, and axillary nodes normal. Neurologic: grossly normal  Pelvic: External genitalia:  no lesions              No abnormal inguinal nodes palpated.              Urethra:  normal appearing urethra with no masses, tenderness or lesions              Bartholins and Skenes: normal                 Vagina: normal appearing vagina with normal color and discharge, no lesions              Cervix: no lesions              Pap taken: yes Bimanual Exam:  Uterus:  normal size, contour, position, consistency, mobility, non-tender              Adnexa: no mass, fullness, tenderness              Rectal exam: yes.  Confirms.              Anus:  normal sphincter tone, no lesions  Chaperone was present for exam:  Kari HERO, CMA  ASSESSMENT: Encounter for breast and pelvic exam.  Personal hx of other medical treatment.   Cervical cancer screening.  FH ovarian cancer.  Genetic testing:  Birt-Hogg-Dube syndrome:  increased risk of renal cell cancer and pneumothorax.  Has has CT of chest, abdomen, and pelvis in 2025. Status post bilateral breast reduction.  Osteopenia.  Multiple sites. Hx HSV 1.  Not taking antiviral medication.    PLAN: Mammogram screening discussed. Self breast awareness reviewed. Pap and HRV collected:  yes Guidelines for Calcium, Vitamin D , regular exercise program including cardiovascular and weight bearing exercise. Medication refills:  NA Labs with PCP.  BMD in 2027. Return for pelvic ultrasound in January.  Follow up:  yearly and prn.   Additional counseling given.  yes. 30 min  total time was spent for this patient encounter, including preparation, face-to-face counseling with the patient, coordination of care, and documentation of the encounter in addition to doing the breast and pelvic exam.

## 2024-10-29 ENCOUNTER — Encounter: Payer: Self-pay | Admitting: Obstetrics and Gynecology

## 2024-10-30 ENCOUNTER — Other Ambulatory Visit: Payer: Self-pay | Admitting: Physician Assistant

## 2024-10-30 DIAGNOSIS — J309 Allergic rhinitis, unspecified: Secondary | ICD-10-CM

## 2024-10-30 LAB — CYTOLOGY - PAP
Adequacy: ABSENT
Comment: NEGATIVE
Diagnosis: NEGATIVE
High risk HPV: NEGATIVE

## 2024-11-02 ENCOUNTER — Ambulatory Visit: Payer: Self-pay | Admitting: Obstetrics and Gynecology

## 2024-12-02 ENCOUNTER — Other Ambulatory Visit

## 2024-12-07 ENCOUNTER — Encounter: Payer: Self-pay | Admitting: Physician Assistant

## 2024-12-07 DIAGNOSIS — E7800 Pure hypercholesterolemia, unspecified: Secondary | ICD-10-CM

## 2024-12-08 MED ORDER — LOVASTATIN 40 MG PO TABS: 40.0000 mg | ORAL_TABLET | Freq: Every day | ORAL | 2 refills | Status: AC

## 2024-12-14 ENCOUNTER — Telehealth: Payer: Self-pay

## 2024-12-14 NOTE — Progress Notes (Signed)
° °  12/14/2024  Patient ID: Grayce LITTIE Solomons, female   DOB: March 15, 1955, 69 y.o.   MRN: [id number]  This patient is appearing on a report for being at risk of failing the adherence measure for cholesterol (statin) medications this calendar year.   Medication: lovastatin  40mg  daily Last fill date: 12/08/24 for 90 day supply  Insurance report was not up to date. No action needed at this time.   Channing DELENA Mealing, PharmD, DPLA

## 2025-01-04 NOTE — Progress Notes (Signed)
 "  GYNECOLOGY  VISIT   HPI: 70 y.o.   Widowed  Caucasian female   [phone] with Patient's last menstrual period was 12/31/2009.   here for: u/s consult for family history of ovarian cancer.  No pelvic pain.  No bleeding.  No HRT.   CA125 11.5 09/16/24.     Hx proliferative endometrium on prior EMB 2010 and D&C 2011.      Birt-Hogg-Dupe syndrome with increased risk of renal cancer. Normal CT 01/20/24.  Has seen Drs. Norleen Blush and Paul McClain with [facility].    GYNECOLOGIC HISTORY: Patient's last menstrual period was 12/31/2009. Contraception:   Menopausal hormone therapy:  n/a Last 2 paps:  10/28/24 neg HR HPV neg, 06/28/21 neg  History of abnormal Pap or positive HPV:  no Mammogram:  09/29/24 Breast Density Cat B, BIRADS Cat 1 neg         OB History     Gravida  3   Para  2   Term  2   Preterm      AB  1   Living  2      SAB      IAB      Ectopic      Multiple      Live Births                 Patient Active Problem List   Diagnosis Date Noted   Osteopenia 10/23/2024   Right lower quadrant abdominal pain 01/13/2024   Elevated blood pressure reading 01/13/2024   Community acquired pneumonia of right lower lobe of lung 10/29/2023   Low energy 07/15/2023   Vertigo 07/15/2023   Family history of breast cyst 04/12/2023   Mass of lower outer quadrant of right breast 04/12/2023   Breast pain, right 04/12/2023   Skin ulcer, limited to breakdown of skin (HCC) 03/25/2023   Burping 03/19/2022   Bilateral foot pain 03/19/2022   Indigestion 03/19/2022   Chronic bilateral low back pain without sciatica 11/06/2021   Primary insomnia 11/06/2021   DDD (degenerative disc disease), cervical 05/08/2021   DDD (degenerative disc disease), lumbar 05/08/2021   Genetic testing 02/13/2021   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   Family history of ovarian cancer    Family history of breast cancer    Family history of bladder cancer    Adjustment disorder with  mixed anxiety and depressed mood 02/26/2020   Allergy to mold 01/22/2020   Stress due to family tension 11/09/2019   OSA (obstructive sleep apnea) 11/09/2019   Epigastric pain 01/28/2019   Gastroesophageal reflux disease with esophagitis without hemorrhage 01/28/2019   Newly recognized heart murmur 01/28/2019   Bradycardia 01/28/2019   Hematuria 05/27/2018   Overweight (BMI 25.0-29.9) 05/27/2018   Rhinitis, allergic 06/11/2016   Class 1 obesity due to excess calories without serious comorbidity with body mass index (BMI) of 32.0 to 32.9 in adult 01/11/2016   Squamous cell carcinoma 09/21/2014   Chronic allergic rhinitis 05/30/2014   Elevated LDL cholesterol level 04/14/2014   Allergic rhinitis 04/14/2014   Seasonal allergies 04/14/2014   OAB (overactive bladder) 04/14/2014    Past Medical History:  Diagnosis Date   Allergy    --takes allergy injections   Birt-Hogg-Dube syndrome 2022   See genetic test results 02/13/21 in Epic.  Increased risk of renal cancer, pneumothorax.  Needs follow up with pulmonary and urology.   Family history of bladder cancer    Family history of breast cancer    Family history  of ovarian cancer    History of COVID-19 05/31/2021   HSV-1 infection    Hyperlipidemia    Osteopenia 2020   Perimenopause     Past Surgical History:  Procedure Laterality Date   BREAST BIOPSY     DILATION AND CURETTAGE OF UTERUS  10/31/2010   Hysteroscopy with dilation and curettage - Dr. Nikki - [facility]   KNEE SURGERY Bilateral    REDUCTION MAMMAPLASTY Bilateral    REFRACTIVE SURGERY Bilateral     Current Outpatient Medications  Medication Sig Dispense Refill   BIOTIN PO Take by mouth.     fluticasone  (FLONASE ) 50 MCG/ACT nasal spray      levocetirizine (XYZAL ) 5 MG tablet TAKE ONE TABLET BY MOUTH EVERY EVENING 90 tablet 3   lovastatin  (MEVACOR ) 40 MG tablet Take 1 tablet (40 mg total) by mouth at bedtime. 90 tablet 2   montelukast  (SINGULAIR ) 10 MG tablet Take  1 tablet (10 mg total) by mouth at bedtime. 90 tablet 3   tirzepatide (ZEPBOUND) 2.5 MG/0.5ML injection vial Inject 2.5 mg into the skin.     traZODone  (DESYREL ) 50 MG tablet TAKE 1/2 TO 1 TABLET BY MOUTH EVERY NIGHT AT BEDTIME 90 tablet 1   No current facility-administered medications for this visit.     ALLERGIES: Gramineae pollens, Molds & smuts, Tree extract, and Dust mite extract  Family History  Problem Relation Age of Onset   Hypertension Mother    Stroke Mother    Cancer Mother 22       ovarian ca, hx of TTP   Hyperlipidemia Mother    Ovarian cancer Mother    Hypertension Father    Cancer Father 31       bladder ca   Hyperlipidemia Father    Dementia Father        lewy body dementia   Anemia Sister    Mental illness Sister        mentally disabled--lives in group home in [place]   Diabetes Brother    Multiple myeloma Brother    Asthma Brother        allergy induced   Ovarian cancer Maternal Grandmother    Breast cancer Cousin        dx 9s    Social History   Socioeconomic History   Marital status: Widowed    Spouse name: Not on file   Number of children: 2   Years of education: Not on file   Highest education level: Some college, no degree  Occupational History   Not on file  Tobacco Use   Smoking status: Never   Smokeless tobacco: Never  Vaping Use   Vaping status: Never Used  Substance and Sexual Activity   Alcohol use: Yes    Alcohol/week: 1.0 standard drink of alcohol    Types: 1 Glasses of wine per week   Drug use: Never   Sexual activity: Not Currently    Partners: Male    Birth control/protection: Post-menopausal    Comment: Less than 5, after 17, HSV 1, no cancer, no abnormal pap, no DES  Other Topics Concern   Not on file  Social History Narrative   Loanne lives alone. She lives next to her daughter and her family. She enjoys gardening and crocheting.    Social Drivers of Health   Tobacco Use: Low Risk (01/05/2025)   Patient History     Smoking Tobacco Use: Never    Smokeless Tobacco Use: Never    Passive Exposure: Not  on file  Financial Resource Strain: Low Risk (09/08/2024)   Overall Financial Resource Strain (CARDIA)    Difficulty of Paying Living Expenses: Not very hard  Food Insecurity: No Food Insecurity (09/08/2024)   Epic    Worried About Programme Researcher, Broadcasting/film/video in the Last Year: Never true    Ran Out of Food in the Last Year: Never true  Transportation Needs: No Transportation Needs (09/08/2024)   Epic    Lack of Transportation (Medical): No    Lack of Transportation (Non-Medical): No  Physical Activity: Sufficiently Active (09/08/2024)   Exercise Vital Sign    Days of Exercise per Week: 4 days    Minutes of Exercise per Session: 50 min  Recent Concern: Physical Activity - Inactive (07/21/2024)   [facility]   Exercise Vital Sign    On average, how many days per week do you engage in moderate to strenuous exercise (like a brisk walk)?: 0 days    Minutes of Exercise per Session: Not on file  Stress: No Stress Concern Present (09/08/2024)   [employer] of Occupational Health - Occupational Stress Questionnaire    Feeling of Stress: Not at all  Social Connections: Moderately Isolated (09/08/2024)   Social Connection and Isolation Panel    Frequency of Communication with Friends and Family: More than three times a week    Frequency of Social Gatherings with Friends and Family: More than three times a week    Attends Religious Services: More than 4 times per year    Active Member of [employer] or Organizations: No    Attends Banker Meetings: Never    Marital Status: Widowed  Intimate Partner Violence: Not At Risk (09/08/2024)   Epic    Fear of Current or Ex-Partner: No    Emotionally Abused: No    Physically Abused: No    Sexually Abused: No  Depression (PHQ2-9): Low Risk (09/08/2024)   Depression (PHQ2-9)    PHQ-2 Score: 0  Alcohol Screen: Low Risk (09/08/2024)   Alcohol Screen    Last  Alcohol Screening Score (AUDIT): 2  Housing: Low Risk (09/08/2024)   Epic    Unable to Pay for Housing in the Last Year: No    Number of Times Moved in the Last Year: 0    Homeless in the Last Year: No  Utilities: Not At Risk (09/08/2024)   Epic    Threatened with loss of utilities: No  Health Literacy: Adequate Health Literacy (09/08/2024)   B1300 Health Literacy    Frequency of need for help with medical instructions: Never    Review of Systems  See HPI.  PHYSICAL EXAMINATION:   BP 124/72 (BP Location: Left Arm, Patient Position: Sitting, Cuff Size: Normal)   Pulse (!) 51   Wt 148 lb (67.1 kg)   LMP 12/31/2009   SpO2 98%   BMI 28.66 kg/m     General appearance: alert, cooperative and appears stated age   Pelvic US  Uterus 6.37 x 4.71 x 3.48 cm.   No myometrial masses.  EMS 5.57 mm.  No definite mass.  Avascular.  Left ovary 1.43 x 1.15 x 0.91 cm.   Atrophic.  Right ovary 1.68 x 0.83 x 1.38 cm.   Follicle 4.6 mm.   Atrophic.  No adnexal masses.  No free fluid.  ASSESSMENT:  Family history of ovarian cancer.  Thickened endometrium.  Birt-Hogg-Dube syndrome.   PLAN:  US  images and report reviewed.  Return for EMB. Procedure explained along  with rationale. Genetic testing results reviewed.  She will contact her urologist about a follow up appointment.  She will contact her PCP about a referral to pulmonology.   44 min  total time was spent for this patient encounter, including preparation, face-to-face counseling with the patient, coordination of care, and documentation of the encounter.    "

## 2025-01-05 ENCOUNTER — Ambulatory Visit (INDEPENDENT_AMBULATORY_CARE_PROVIDER_SITE_OTHER)

## 2025-01-05 ENCOUNTER — Ambulatory Visit (INDEPENDENT_AMBULATORY_CARE_PROVIDER_SITE_OTHER): Admitting: Obstetrics and Gynecology

## 2025-01-05 ENCOUNTER — Encounter: Payer: Self-pay | Admitting: Obstetrics and Gynecology

## 2025-01-05 VITALS — BP 124/72 | HR 51 | Wt 148.0 lb

## 2025-01-05 DIAGNOSIS — Z8041 Family history of malignant neoplasm of ovary: Secondary | ICD-10-CM

## 2025-01-05 DIAGNOSIS — Q8789 Other specified congenital malformation syndromes, not elsewhere classified: Secondary | ICD-10-CM | POA: Diagnosis not present

## 2025-01-05 DIAGNOSIS — R9389 Abnormal findings on diagnostic imaging of other specified body structures: Secondary | ICD-10-CM | POA: Diagnosis not present

## 2025-01-05 NOTE — Patient Instructions (Addendum)
 Your genetic testing showed you have Birt-Hogg-Dupe syndrome.  This increases you risk of renal cell cancer and pneumothorax, which is lung collapse.     Endometrial Biopsy  An endometrial biopsy is a procedure where a tissue sample is removed from the lining of the uterus. This lining is called the endometrium. The tissue sample is then sent to a lab for testing. You may have this type of biopsy to check for: Cancer. Infection. Growths called polyps. Uterine bleeding that can't be explained. Tell a health care provider about: Any allergies you have. All medicines you're taking including vitamins, herbs, eye drops, creams, and over-the-counter medicines. Any problems you or family members have had with anesthesia. Any bleeding problems you have. Any surgeries you have had. Any medical problems you have. Whether you're pregnant or may be pregnant. What are the risks? Your health care provider will talk with you about risks. These may include: Bleeding. Infection. Allergic reactions to medicines. Damage to the wall of the uterus. This is rare. What happens before the procedure? Keep track of your period. You may need to have this biopsy when you're not having your period. Ask your provider about: Changing or stopping your regular medicines. These include any diabetes medicines or blood thinners you take. Taking medicines such as aspirin and ibuprofen . These medicines can thin your blood. Do not take them unless your provider tells you to. Taking over-the-counter medicines, vitamins, herbs, and supplements. Bring a pad with you. You may need to wear one after the biopsy. Plan to have a responsible adult take you home from the hospital or clinic. You won't be allowed to drive. What happens during the procedure? A tool will be put into your vagina to hold it open. This helps your provider see the cervix. The cervix is the lowest part of the uterus. Your cervix will be cleaned with a  solution that kills germs. You will be given anesthesia. This keeps you from feeling pain. It will numb your cervix. A tool called forceps will be used to hold your cervix steady. A thin tool called a uterine sound will be put through your cervix. It will be used to: Find the length of your uterus. Find where to take the sample from. A soft tube called a catheter will be put into your uterus. The catheter will remove a tissue sample. The tube and tools will be removed. The sample will be sent to a lab for testing. The procedure may vary among providers and hospitals. What happens after the procedure? Your blood pressure, heart rate, breathing rate, and blood oxygen level will be monitored until you leave the hospital or clinic. It's up to you to get the results of your procedure. Ask your provider, or the department that is doing the procedure, when your results will be ready. This information is not intended to replace advice given to you by your health care provider. Make sure you discuss any questions you have with your health care provider. Document Revised: 02/26/2023 Document Reviewed: 02/26/2023 Elsevier Patient Education  2024 [employer].

## 2025-01-07 ENCOUNTER — Encounter: Payer: Self-pay | Admitting: Physician Assistant

## 2025-01-26 ENCOUNTER — Ambulatory Visit: Admitting: Physician Assistant

## 2025-01-26 VITALS — BP 142/68 | HR 99 | Temp 99.3°F | Ht 60.0 in

## 2025-01-26 DIAGNOSIS — Q8789 Other specified congenital malformation syndromes, not elsewhere classified: Secondary | ICD-10-CM | POA: Diagnosis not present

## 2025-01-26 DIAGNOSIS — J309 Allergic rhinitis, unspecified: Secondary | ICD-10-CM | POA: Diagnosis not present

## 2025-01-26 DIAGNOSIS — R051 Acute cough: Secondary | ICD-10-CM

## 2025-01-26 DIAGNOSIS — F5101 Primary insomnia: Secondary | ICD-10-CM

## 2025-01-26 DIAGNOSIS — R6889 Other general symptoms and signs: Secondary | ICD-10-CM

## 2025-01-26 DIAGNOSIS — H6501 Acute serous otitis media, right ear: Secondary | ICD-10-CM | POA: Diagnosis not present

## 2025-01-26 LAB — POC SOFIA 2 FLU + SARS ANTIGEN FIA
Influenza A, POC: NEGATIVE
Influenza B, POC: NEGATIVE
SARS Coronavirus 2 Ag: NEGATIVE

## 2025-01-26 MED ORDER — AMOXICILLIN-POT CLAVULANATE 875-125 MG PO TABS: 1.0000 | ORAL_TABLET | Freq: Two times a day (BID) | ORAL | 0 refills | Status: AC

## 2025-01-26 MED ORDER — FLUTICASONE PROPIONATE 50 MCG/ACT NA SUSP: 2.0000 | Freq: Every day | NASAL | 1 refills | Status: AC

## 2025-01-26 MED ORDER — TRAZODONE HCL 50 MG PO TABS: 50.0000 mg | ORAL_TABLET | Freq: Every day | ORAL | 3 refills | Status: AC

## 2025-01-26 NOTE — Progress Notes (Signed)
 "  Acute Office Visit  Subjective:     Patient ID: Rebekah [name], female    DOB: 1955/08/13, 70 y.o.   MRN: [id number]  Chief Complaint  Patient presents with   Cough   Nasal Congestion   Sinus Problem    Everything onset yesterday - no fever -     70 year old female presents for acute onset headache, R ear pain, and rhinorrhea that began yesterday. She has taken alka seltzer cold/flu twice without improvement of symptoms. She reports a constant dull pain around her frontal and maxillary sinuses, post-nasal drip, and decreased appetite. She denies fever, vomiting, cough, and SOB. The R ear pain is the most concerning to her as it kept her up all night. She has not tried acetaminophen  or NSAID's.   She was told about genetic disease Birt Aneta Linen syndrome and wonders about any needed management for prevention.   She request refill on flonase  for nasal congestion and swelling.   She is doing great with trazodone  for sleep. Wonders if she needs to keep taking it.     ROS See HPI     Objective:    BP (!) 142/68 (BP Location: Right Arm, Patient Position: Sitting, Cuff Size: Normal)   Pulse 99   Temp 99.3 F (37.4 C) (Oral)   Ht 5' (1.524 m)   LMP 12/31/2009   SpO2 96%   BMI 28.90 kg/m  BP Readings from Last 3 Encounters:  01/26/25 (!) 142/68  01/05/25 124/72  10/28/24 118/76      Physical Exam Constitutional:      Appearance: Normal appearance.  HENT:     Head: Normocephalic and atraumatic.     Right Ear: Ear canal and external ear normal.     Left Ear: Tympanic membrane, ear canal and external ear normal.     Ears:     Comments: Right: Bulging, erythematous TM with serous effusion present     Nose: Congestion and rhinorrhea present.     Mouth/Throat:     Pharynx: Posterior oropharyngeal erythema present.  Eyes:     Extraocular Movements: Extraocular movements intact.     Conjunctiva/sclera: Conjunctivae normal.  Cardiovascular:     Rate and Rhythm: Normal  rate and regular rhythm.     Heart sounds: Normal heart sounds.  Pulmonary:     Effort: Pulmonary effort is normal.     Breath sounds: Normal breath sounds.  Musculoskeletal:     Cervical back: Normal range of motion.  Lymphadenopathy:     Cervical: Cervical adenopathy present.  Skin:    General: Skin is warm and dry.  Neurological:     Mental Status: She is alert and oriented to person, place, and time.  Psychiatric:        Mood and Affect: Mood normal.        Behavior: Behavior normal.        Judgment: Judgment normal.     Results for orders placed or performed in visit on 01/26/25  POC SOFIA 2 FLU + SARS ANTIGEN FIA  Result Value Ref Range   Influenza A, POC Negative Negative   Influenza B, POC Negative Negative   SARS Coronavirus 2 Ag Negative Negative        Assessment & Plan:  Nikeria was seen today for cough, nasal congestion and sinus problem.  Diagnoses and all orders for this visit:  Non-recurrent acute serous otitis media of right ear -     amoxicillin -clavulanate (AUGMENTIN ) 875-125 MG tablet; Take  1 tablet by mouth 2 (two) times daily.  Acute cough -     POC SOFIA 2 FLU + SARS ANTIGEN FIA  Primary insomnia -     traZODone  (DESYREL ) 50 MG tablet; Take 1 tablet (50 mg total) by mouth at bedtime.  Chronic allergic rhinitis -     fluticasone  (FLONASE ) 50 MCG/ACT nasal spray; Place 2 sprays into both nostrils daily.  Flu-like symptoms  Birt-Hogg-Dube syndrome -     Ambulatory referral to Pulmonology  - POCT flu/COVID negative in clinic today - continue with fluticasone  nasal spray daily for allergic rhinitis and to help with acute rhinorrhea and congestion - start amoxicillin -clavulanate for R otitis media with effusion - trazodone  refilled today for insomnia ok to decrease intake or stop to see if she still needs - referral for pulmonology placed for management of Birt Hogg Dub syndrome. Patient already has urologist and gyneocologist for other increase  risk associated with syndrome.  - discussed signs and symptoms that warrant immediate follow-up including new fever, worsening of symptoms, and SOB - follow-up is symptoms worsen or fail to improve    Vermell Bologna, PA-C   "

## 2025-01-26 NOTE — Patient Instructions (Signed)

## 2025-02-02 ENCOUNTER — Encounter: Payer: Self-pay | Admitting: Physician Assistant

## 2025-02-02 MED ORDER — METHYLPREDNISOLONE 4 MG PO TBPK: ORAL_TABLET | ORAL | 0 refills | Status: AC

## 2025-02-08 ENCOUNTER — Ambulatory Visit: Admitting: Obstetrics and Gynecology

## 2025-02-24 ENCOUNTER — Ambulatory Visit (HOSPITAL_BASED_OUTPATIENT_CLINIC_OR_DEPARTMENT_OTHER): Admitting: Pulmonary Disease

## 2025-09-09 ENCOUNTER — Ambulatory Visit

## 2025-09-22 ENCOUNTER — Ambulatory Visit: Admitting: Physician Assistant

## 2025-11-03 ENCOUNTER — Encounter: Admitting: Obstetrics and Gynecology
